# Patient Record
Sex: Female | Born: 1955 | Race: White | Hispanic: No | Marital: Married | Smoking: Former smoker
Health system: Southern US, Community
[De-identification: ages and names within clinical notes are randomized; demographics above are authoritative.]

## PROBLEM LIST (undated history)

## (undated) DIAGNOSIS — M5030 Other cervical disc degeneration, unspecified cervical region: Secondary | ICD-10-CM

## (undated) DIAGNOSIS — K635 Polyp of colon: Secondary | ICD-10-CM

## (undated) DIAGNOSIS — K219 Gastro-esophageal reflux disease without esophagitis: Secondary | ICD-10-CM

## (undated) DIAGNOSIS — I10 Essential (primary) hypertension: Secondary | ICD-10-CM

## (undated) DIAGNOSIS — R945 Abnormal results of liver function studies: Secondary | ICD-10-CM

## (undated) DIAGNOSIS — K589 Irritable bowel syndrome without diarrhea: Secondary | ICD-10-CM

## (undated) DIAGNOSIS — K859 Acute pancreatitis without necrosis or infection, unspecified: Secondary | ICD-10-CM

## (undated) DIAGNOSIS — C801 Malignant (primary) neoplasm, unspecified: Secondary | ICD-10-CM

## (undated) DIAGNOSIS — Z800 Family history of malignant neoplasm of digestive organs: Secondary | ICD-10-CM

## (undated) DIAGNOSIS — Z803 Family history of malignant neoplasm of breast: Secondary | ICD-10-CM

## (undated) DIAGNOSIS — H5702 Anisocoria: Secondary | ICD-10-CM

## (undated) DIAGNOSIS — K449 Diaphragmatic hernia without obstruction or gangrene: Secondary | ICD-10-CM

## (undated) DIAGNOSIS — K760 Fatty (change of) liver, not elsewhere classified: Secondary | ICD-10-CM

## (undated) DIAGNOSIS — J329 Chronic sinusitis, unspecified: Secondary | ICD-10-CM

## (undated) DIAGNOSIS — D649 Anemia, unspecified: Secondary | ICD-10-CM

## (undated) DIAGNOSIS — T7840XA Allergy, unspecified, initial encounter: Secondary | ICD-10-CM

## (undated) DIAGNOSIS — E039 Hypothyroidism, unspecified: Secondary | ICD-10-CM

## (undated) DIAGNOSIS — M62830 Muscle spasm of back: Secondary | ICD-10-CM

## (undated) DIAGNOSIS — K2970 Gastritis, unspecified, without bleeding: Secondary | ICD-10-CM

## (undated) DIAGNOSIS — Q828 Other specified congenital malformations of skin: Secondary | ICD-10-CM

## (undated) DIAGNOSIS — Z923 Personal history of irradiation: Secondary | ICD-10-CM

## (undated) DIAGNOSIS — M1990 Unspecified osteoarthritis, unspecified site: Secondary | ICD-10-CM

## (undated) HISTORY — PX: EYE SURGERY: SHX253

## (undated) HISTORY — PX: CHOLECYSTECTOMY: SHX55

## (undated) HISTORY — DX: Acute pancreatitis without necrosis or infection, unspecified: K85.90

## (undated) HISTORY — DX: Gastritis, unspecified, without bleeding: K29.70

## (undated) HISTORY — PX: PATELLA RECONSTRUCTION: SHX736

## (undated) HISTORY — PX: POLYPECTOMY: SHX149

## (undated) HISTORY — DX: Abnormal results of liver function studies: R94.5

## (undated) HISTORY — DX: Family history of malignant neoplasm of digestive organs: Z80.0

## (undated) HISTORY — DX: Chronic sinusitis, unspecified: J32.9

## (undated) HISTORY — DX: Essential (primary) hypertension: I10

## (undated) HISTORY — DX: Other cervical disc degeneration, unspecified cervical region: M50.30

## (undated) HISTORY — PX: UPPER GASTROINTESTINAL ENDOSCOPY: SHX188

## (undated) HISTORY — DX: Polyp of colon: K63.5

## (undated) HISTORY — DX: Unspecified osteoarthritis, unspecified site: M19.90

## (undated) HISTORY — PX: COLONOSCOPY: SHX174

## (undated) HISTORY — DX: Irritable bowel syndrome, unspecified: K58.9

## (undated) HISTORY — DX: Anisocoria: H57.02

## (undated) HISTORY — PX: BREAST LUMPECTOMY: SHX2

## (undated) HISTORY — DX: Other specified congenital malformations of skin: Q82.8

## (undated) HISTORY — DX: Gastro-esophageal reflux disease without esophagitis: K21.9

## (undated) HISTORY — DX: Malignant (primary) neoplasm, unspecified: C80.1

## (undated) HISTORY — DX: Hypothyroidism, unspecified: E03.9

## (undated) HISTORY — DX: Family history of malignant neoplasm of breast: Z80.3

## (undated) HISTORY — PX: ESOPHAGOGASTRODUODENOSCOPY: SHX1529

## (undated) HISTORY — PX: TUBAL LIGATION: SHX77

## (undated) HISTORY — DX: Muscle spasm of back: M62.830

## (undated) HISTORY — DX: Anemia, unspecified: D64.9

## (undated) HISTORY — DX: Allergy, unspecified, initial encounter: T78.40XA

## (undated) HISTORY — DX: Fatty (change of) liver, not elsewhere classified: K76.0

## (undated) HISTORY — PX: COLONOSCOPY W/ BIOPSIES: SHX1374

## (undated) HISTORY — DX: Diaphragmatic hernia without obstruction or gangrene: K44.9

## (undated) HISTORY — PX: ANAL FISTULECTOMY: SHX1139

---

## 1999-05-14 ENCOUNTER — Encounter: Payer: Self-pay | Admitting: Obstetrics and Gynecology

## 1999-05-14 ENCOUNTER — Encounter: Admission: RE | Admit: 1999-05-14 | Discharge: 1999-05-14 | Payer: Self-pay | Admitting: Obstetrics and Gynecology

## 2000-06-10 ENCOUNTER — Encounter: Payer: Self-pay | Admitting: Obstetrics and Gynecology

## 2000-06-10 ENCOUNTER — Encounter: Admission: RE | Admit: 2000-06-10 | Discharge: 2000-06-10 | Payer: Self-pay | Admitting: Obstetrics and Gynecology

## 2000-06-24 ENCOUNTER — Other Ambulatory Visit: Admission: RE | Admit: 2000-06-24 | Discharge: 2000-06-24 | Payer: Self-pay | Admitting: Obstetrics and Gynecology

## 2000-06-29 ENCOUNTER — Ambulatory Visit (HOSPITAL_COMMUNITY): Admission: RE | Admit: 2000-06-29 | Discharge: 2000-06-29 | Payer: Self-pay | Admitting: Obstetrics and Gynecology

## 2000-06-29 ENCOUNTER — Encounter: Payer: Self-pay | Admitting: Obstetrics and Gynecology

## 2001-01-07 ENCOUNTER — Encounter: Payer: Self-pay | Admitting: General Surgery

## 2001-01-11 ENCOUNTER — Encounter (INDEPENDENT_AMBULATORY_CARE_PROVIDER_SITE_OTHER): Payer: Self-pay | Admitting: Specialist

## 2001-01-11 ENCOUNTER — Observation Stay (HOSPITAL_COMMUNITY): Admission: RE | Admit: 2001-01-11 | Discharge: 2001-01-12 | Payer: Self-pay | Admitting: General Surgery

## 2001-07-27 ENCOUNTER — Encounter: Payer: Self-pay | Admitting: Obstetrics and Gynecology

## 2001-07-27 ENCOUNTER — Encounter: Admission: RE | Admit: 2001-07-27 | Discharge: 2001-07-27 | Payer: Self-pay | Admitting: Obstetrics and Gynecology

## 2002-02-17 ENCOUNTER — Encounter: Payer: Self-pay | Admitting: Gastroenterology

## 2002-02-23 ENCOUNTER — Other Ambulatory Visit: Admission: RE | Admit: 2002-02-23 | Discharge: 2002-02-23 | Payer: Self-pay | Admitting: Unknown Physician Specialty

## 2002-08-15 ENCOUNTER — Encounter (HOSPITAL_COMMUNITY): Admission: RE | Admit: 2002-08-15 | Discharge: 2002-09-14 | Payer: Self-pay | Admitting: Endocrinology

## 2002-09-01 ENCOUNTER — Encounter: Admission: RE | Admit: 2002-09-01 | Discharge: 2002-09-01 | Payer: Self-pay | Admitting: Obstetrics and Gynecology

## 2002-09-01 ENCOUNTER — Encounter: Payer: Self-pay | Admitting: Obstetrics and Gynecology

## 2004-12-02 ENCOUNTER — Ambulatory Visit (HOSPITAL_COMMUNITY): Admission: RE | Admit: 2004-12-02 | Discharge: 2004-12-02 | Payer: Self-pay | Admitting: Obstetrics and Gynecology

## 2004-12-31 ENCOUNTER — Encounter: Admission: RE | Admit: 2004-12-31 | Discharge: 2004-12-31 | Payer: Self-pay | Admitting: Obstetrics and Gynecology

## 2005-01-13 ENCOUNTER — Other Ambulatory Visit: Admission: RE | Admit: 2005-01-13 | Discharge: 2005-01-13 | Payer: Self-pay | Admitting: Obstetrics and Gynecology

## 2005-01-22 ENCOUNTER — Encounter: Admission: RE | Admit: 2005-01-22 | Discharge: 2005-01-22 | Payer: Self-pay | Admitting: Obstetrics and Gynecology

## 2005-12-15 ENCOUNTER — Ambulatory Visit: Payer: Self-pay | Admitting: Gastroenterology

## 2005-12-21 ENCOUNTER — Encounter (INDEPENDENT_AMBULATORY_CARE_PROVIDER_SITE_OTHER): Payer: Self-pay | Admitting: *Deleted

## 2005-12-21 ENCOUNTER — Ambulatory Visit: Payer: Self-pay | Admitting: Gastroenterology

## 2005-12-21 DIAGNOSIS — K2990 Gastroduodenitis, unspecified, without bleeding: Secondary | ICD-10-CM

## 2005-12-21 DIAGNOSIS — K2970 Gastritis, unspecified, without bleeding: Secondary | ICD-10-CM | POA: Insufficient documentation

## 2005-12-21 DIAGNOSIS — K449 Diaphragmatic hernia without obstruction or gangrene: Secondary | ICD-10-CM | POA: Insufficient documentation

## 2005-12-23 ENCOUNTER — Ambulatory Visit: Payer: Self-pay | Admitting: Internal Medicine

## 2006-01-05 ENCOUNTER — Ambulatory Visit: Payer: Self-pay | Admitting: Gastroenterology

## 2006-01-06 ENCOUNTER — Encounter: Admission: RE | Admit: 2006-01-06 | Discharge: 2006-01-06 | Payer: Self-pay | Admitting: Obstetrics and Gynecology

## 2006-03-30 ENCOUNTER — Ambulatory Visit: Payer: Self-pay | Admitting: Gastroenterology

## 2006-03-30 LAB — CONVERTED CEMR LAB
ALT: 51 units/L — ABNORMAL HIGH (ref 0–40)
AST: 41 units/L — ABNORMAL HIGH (ref 0–37)
Albumin: 3.6 g/dL (ref 3.5–5.2)
Alkaline Phosphatase: 105 units/L (ref 39–117)
Total Bilirubin: 0.5 mg/dL (ref 0.3–1.2)

## 2006-03-31 ENCOUNTER — Encounter: Admission: RE | Admit: 2006-03-31 | Discharge: 2006-06-29 | Payer: Self-pay | Admitting: Gastroenterology

## 2006-07-08 ENCOUNTER — Ambulatory Visit: Payer: Self-pay | Admitting: Gastroenterology

## 2006-07-08 LAB — CONVERTED CEMR LAB
AST: 30 units/L (ref 0–37)
Albumin: 3.5 g/dL (ref 3.5–5.2)
Bilirubin, Direct: 0.1 mg/dL (ref 0.0–0.3)
Total Bilirubin: 0.7 mg/dL (ref 0.3–1.2)
Total Protein: 6.6 g/dL (ref 6.0–8.3)

## 2006-11-23 ENCOUNTER — Ambulatory Visit: Payer: Self-pay | Admitting: Gastroenterology

## 2006-11-23 LAB — CONVERTED CEMR LAB
Albumin: 3.8 g/dL (ref 3.5–5.2)
Alkaline Phosphatase: 102 units/L (ref 39–117)
Hgb A1c MFr Bld: 5.6 % (ref 4.6–6.0)
Total Bilirubin: 0.6 mg/dL (ref 0.3–1.2)

## 2007-01-26 ENCOUNTER — Ambulatory Visit: Payer: Self-pay | Admitting: Gastroenterology

## 2007-01-26 LAB — CONVERTED CEMR LAB
AST: 37 units/L (ref 0–37)
Albumin: 3.6 g/dL (ref 3.5–5.2)
Alkaline Phosphatase: 94 units/L (ref 39–117)
Total Bilirubin: 0.5 mg/dL (ref 0.3–1.2)

## 2007-03-23 DIAGNOSIS — Z8639 Personal history of other endocrine, nutritional and metabolic disease: Secondary | ICD-10-CM | POA: Insufficient documentation

## 2007-03-23 DIAGNOSIS — Z862 Personal history of diseases of the blood and blood-forming organs and certain disorders involving the immune mechanism: Secondary | ICD-10-CM

## 2007-03-23 DIAGNOSIS — J309 Allergic rhinitis, unspecified: Secondary | ICD-10-CM | POA: Insufficient documentation

## 2007-03-23 DIAGNOSIS — K589 Irritable bowel syndrome without diarrhea: Secondary | ICD-10-CM | POA: Insufficient documentation

## 2007-03-23 DIAGNOSIS — M1711 Unilateral primary osteoarthritis, right knee: Secondary | ICD-10-CM | POA: Insufficient documentation

## 2007-03-23 DIAGNOSIS — I10 Essential (primary) hypertension: Secondary | ICD-10-CM | POA: Insufficient documentation

## 2007-03-23 DIAGNOSIS — Z8719 Personal history of other diseases of the digestive system: Secondary | ICD-10-CM | POA: Insufficient documentation

## 2007-05-17 ENCOUNTER — Encounter: Admission: RE | Admit: 2007-05-17 | Discharge: 2007-05-17 | Payer: Self-pay | Admitting: Obstetrics and Gynecology

## 2007-06-16 ENCOUNTER — Ambulatory Visit: Payer: Self-pay | Admitting: Gastroenterology

## 2007-06-21 LAB — CONVERTED CEMR LAB
ALT: 42 units/L — ABNORMAL HIGH (ref 0–35)
AST: 32 units/L (ref 0–37)
Total Bilirubin: 0.7 mg/dL (ref 0.3–1.2)
Total Protein: 7.2 g/dL (ref 6.0–8.3)

## 2007-06-22 ENCOUNTER — Telehealth: Payer: Self-pay | Admitting: Gastroenterology

## 2007-07-12 ENCOUNTER — Ambulatory Visit: Payer: Self-pay | Admitting: Gastroenterology

## 2007-07-12 DIAGNOSIS — R1011 Right upper quadrant pain: Secondary | ICD-10-CM | POA: Insufficient documentation

## 2007-09-27 ENCOUNTER — Telehealth: Payer: Self-pay | Admitting: Gastroenterology

## 2007-10-12 ENCOUNTER — Ambulatory Visit: Payer: Self-pay | Admitting: Gastroenterology

## 2007-10-12 LAB — CONVERTED CEMR LAB
ALT: 29 units/L (ref 0–35)
Hgb A1c MFr Bld: 5.7 % (ref 4.6–6.0)
Total Bilirubin: 0.7 mg/dL (ref 0.3–1.2)
Total Protein: 7.2 g/dL (ref 6.0–8.3)

## 2007-11-17 ENCOUNTER — Telehealth: Payer: Self-pay | Admitting: Gastroenterology

## 2007-11-18 ENCOUNTER — Ambulatory Visit: Payer: Self-pay | Admitting: Gastroenterology

## 2007-11-21 ENCOUNTER — Ambulatory Visit: Payer: Self-pay | Admitting: Gastroenterology

## 2007-11-21 LAB — CONVERTED CEMR LAB
AST: 23 units/L (ref 0–37)
Bilirubin, Direct: 0.1 mg/dL (ref 0.0–0.3)
Total Bilirubin: 0.7 mg/dL (ref 0.3–1.2)

## 2007-11-29 ENCOUNTER — Ambulatory Visit (HOSPITAL_COMMUNITY): Admission: RE | Admit: 2007-11-29 | Discharge: 2007-11-29 | Payer: Self-pay | Admitting: Gastroenterology

## 2008-01-09 ENCOUNTER — Encounter: Payer: Self-pay | Admitting: Gastroenterology

## 2008-05-24 ENCOUNTER — Encounter: Admission: RE | Admit: 2008-05-24 | Discharge: 2008-05-24 | Payer: Self-pay | Admitting: Obstetrics and Gynecology

## 2009-01-28 ENCOUNTER — Encounter: Payer: Self-pay | Admitting: Gastroenterology

## 2009-01-28 ENCOUNTER — Telehealth: Payer: Self-pay | Admitting: Gastroenterology

## 2009-05-02 ENCOUNTER — Ambulatory Visit: Payer: Self-pay | Admitting: Gastroenterology

## 2009-05-02 DIAGNOSIS — Z8719 Personal history of other diseases of the digestive system: Secondary | ICD-10-CM | POA: Insufficient documentation

## 2009-05-09 ENCOUNTER — Ambulatory Visit (HOSPITAL_COMMUNITY): Admission: RE | Admit: 2009-05-09 | Discharge: 2009-05-09 | Payer: Self-pay | Admitting: Gastroenterology

## 2009-05-27 ENCOUNTER — Other Ambulatory Visit: Admission: RE | Admit: 2009-05-27 | Discharge: 2009-05-27 | Payer: Self-pay | Admitting: Obstetrics and Gynecology

## 2009-06-17 ENCOUNTER — Encounter: Admission: RE | Admit: 2009-06-17 | Discharge: 2009-06-17 | Payer: Self-pay | Admitting: Obstetrics and Gynecology

## 2010-01-22 ENCOUNTER — Encounter
Admission: RE | Admit: 2010-01-22 | Discharge: 2010-01-22 | Payer: Self-pay | Source: Home / Self Care | Attending: Family Medicine | Admitting: Family Medicine

## 2010-02-06 ENCOUNTER — Encounter: Payer: Self-pay | Admitting: Gastroenterology

## 2010-02-09 ENCOUNTER — Encounter: Payer: Self-pay | Admitting: Gastroenterology

## 2010-02-11 ENCOUNTER — Other Ambulatory Visit
Admission: RE | Admit: 2010-02-11 | Discharge: 2010-02-11 | Payer: Self-pay | Source: Home / Self Care | Admitting: Interventional Radiology

## 2010-02-11 ENCOUNTER — Encounter
Admission: RE | Admit: 2010-02-11 | Discharge: 2010-02-11 | Payer: Self-pay | Source: Home / Self Care | Attending: Family Medicine | Admitting: Family Medicine

## 2010-02-11 ENCOUNTER — Other Ambulatory Visit: Payer: Self-pay | Admitting: Interventional Radiology

## 2010-02-15 ENCOUNTER — Encounter: Payer: Self-pay | Admitting: Obstetrics and Gynecology

## 2010-02-27 NOTE — Miscellaneous (Signed)
Summary: Change in PPI  patient must try and fail two covered PPIs before they will cover Nexium. She has tried omeprazole but no others she has agreed to try a different ppi covered by her insurance. I sent protonix  Clinical Lists Changes  Medications: Changed medication from NEXIUM 40 MG  CPDR (ESOMEPRAZOLE MAGNESIUM) once daily to PANTOPRAZOLE SODIUM 40 MG TBEC (PANTOPRAZOLE SODIUM) take one by mouth once daily - Signed Rx of PANTOPRAZOLE SODIUM 40 MG TBEC (PANTOPRAZOLE SODIUM) take one by mouth once daily;  #90 x 1;  Signed;  Entered by: Harlow Mares CMA (AAMA);  Authorized by: Mardella Layman MD [facility];  Method used: Electronically to [facility]*, , ,   , Ph: [phone], Fax: [phone]    Prescriptions: PANTOPRAZOLE SODIUM 40 MG TBEC (PANTOPRAZOLE SODIUM) take one by mouth once daily  #90 x 1   Entered by:   Harlow Mares CMA (AAMA)   Authorized by:   Mardella Layman MD [facility]   Signed by:   Harlow Mares CMA (AAMA) on 02/06/2010   Method used:   Electronically to        [facility] (retail)             ,          Ph: [phone]       Fax: [phone]   RxID:   [id number]

## 2010-02-27 NOTE — Medication Information (Signed)
Summary: Prior autho request for Nexium/[facility]  Prior autho request for Nexium/[facility]   Imported By: Sherian Rein 02/13/2010 14:20:19  _____________________________________________________________________  External Attachment:    Type:   Image     Comment:   External Document

## 2010-02-27 NOTE — Assessment & Plan Note (Signed)
Summary: meds refill--ch.    History of Present Illness Visit Type: Follow-up Visit Primary GI MD: Sheryn Bison MD FACP FAGA Primary Provider: Benny Lennert, PA Chief Complaint: Needs refills on Nexium and Potassium sent to [facility], Pt only c/o RUQ pain that radiates to back. Pt states she has been dx'd with fatty liver in the past History of Present Illness:   Catherine Navarro is asymptomatic except for musculoskeletal right upper quadrant pain. She denies reflux symptoms taking daily Nexium. Her weight is under control and she is exercising regularly. Liver function tests have been normal. She has known fatty liver with last ultrasound approximately a year and a half ago. She is up-to-date on colonoscopy and denies better regularity, melena or hematochezia. She is status post cholecystectomy.   GI Review of Systems    Reports abdominal pain.     Location of  Abdominal pain: RUQ.    Denies acid reflux, belching, bloating, chest pain, dysphagia with liquids, dysphagia with solids, heartburn, loss of appetite, nausea, vomiting, vomiting blood, weight loss, and  weight gain.      Reports constipation, diarrhea, and  liver problems.     Denies anal fissure, black tarry stools, change in bowel habit, diverticulosis, fecal incontinence, heme positive stool, hemorrhoids, irritable bowel syndrome, jaundice, light color stool, rectal bleeding, and  rectal pain.    Current Medications (verified): 1)  Nexium 40 Mg  Cpdr (Esomeprazole Magnesium) .... Once Daily 2)  Hyzaar 100-12.5 Mg  Tabs (Losartan Potassium-Hctz) .... Once Daily 3)  Innopran Xl 80 Mg  Cp24 (Propranolol Hcl Sr Beads) .... Once Daily 4)  Potassium Chloride Crys Cr 20 Meq  Tbcr (Potassium Chloride Crys Cr) .... Once Daily 5)  Multivitamins   Tabs (Multiple Vitamin) .... Once Daily 6)  Vitamin B-12 Cr 2000 Mcg Cr-Tabs (Cyanocobalamin) 7)  Garlic 400 Mg  Tbec (Garlic) .... Once Daily 8)  Claritin 10 Mg  Caps (Loratadine) .... Once Daily 9)   Aspirin 81 Mg Tbec (Aspirin) .Marland Kitchen.. 1 By Mouth Every Other Day 10)  Vitamin D 2000 Unit Caps (Cholecalciferol) .Marland Kitchen.. 1 By Mouth Once Daily  Allergies (verified): 1)  ! 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  Alcohol Use - no Daily Caffeine Use Illicit Drug Use - no Patient gets regular exercise.  Past medical, surgical, family and social histories (including risk factors) reviewed for relevance to current acute and chronic problems.  Past Medical History: Reviewed history from 03/23/2007 and no changes required. HIATAL HERNIA (ICD-553.3) GASTRITIS (ICD-535.50) PANCREATITIS, ACUTE, HX OF (ICD-V12.70) LIVER FUNCTION TESTS, ABNORMAL, HX OF (ICD-V12.2) SINUSITIS (ICD-473.9) IBS (ICD-564.1) GERD (ICD-530.81) THYROID DISORDER (ICD-246.9) OSTEOARTHRITIS (ICD-715.90) HYPERTENSION (ICD-401.9)  Past Surgical History: Reviewed history from 03/23/2007 and no changes required. Tubaligation (1980's) Rt Knee cap (1977) Rt Eye Cholecystectomy (2002) Anal Fistulotomy (2002)  Family History: Reviewed history from 07/12/2007 and no changes required. Family History of Breast Cancer:Aunt No FH of Colon Cancer: Family History of Colon Polyps:Brother Family History of Diabetes:Mother, sister  Family History of Heart Disease: Grandfather Family History of Irritable Bowel Syndrome:Mother  Social History: Reviewed history from 07/12/2007 and no changes required. 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  Alcohol Use - no Daily Caffeine Use Illicit  Drug Use - no Patient gets regular exercise.  Review of Systems        The patient complains of arthritis/joint pain.  The patient denies allergy/sinus, anemia, anxiety-new, back pain, blood in urine, breast changes/lumps, change in vision, confusion, cough, coughing up blood, depression-new, fainting, fatigue, fever, headaches-new, hearing problems, heart murmur, heart rhythm changes, itching, menstrual pain, muscle pains/cramps, night sweats, nosebleeds, pregnancy symptoms, shortness of breath, skin rash, sleeping problems, sore throat, swelling of feet/legs, swollen lymph glands, thirst - excessive , urination - excessive , urination changes/pain, urine leakage, vision changes, and voice change.    Vital Signs:  Patient profile:   55 year old female Height:      66 inches Weight:      255 pounds BMI:     41.31 BSA:     2.22 Pulse rate:   74 / minute Pulse rhythm:   regular BP sitting:   132 / 82  (left arm)  Vitals Entered By: Merri Ray CMA Duncan Dull) (May 02, 2009 11:34 AM)  Physical Exam  General:  Well developed, well nourished, no acute distress.obese.   Head:  Normocephalic and atraumatic. Eyes:  PERRLA, no icterus.exam deferred to patient's ophthalmologist.   Abdomen:  Soft, nontender and nondistended. No masses, hepatosplenomegaly or hernias noted. Normal bowel sounds.obese.  Markedly tender over floating rib in the right lateral costal area. She does not appear to have significant hepatomegaly or hepatic tenderness at this time. Extremities:  No clubbing, cyanosis, edema or deformities noted. Neurologic:  Alert and  oriented x4;  grossly normal neurologically. Psych:  Alert and cooperative. Normal mood and affect.   Impression & Recommendations:  Problem # 1:  FATTY LIVER DISEASE (ICD-571.8) Assessment Improved Followup ultrasound of upper abdomen ordered. She should continue to have every six-month liver function tests exams. Again we have discussed weight loss and exercise programs.  Problem # 2:  ABDOMINAL PAIN-RUQ  (ICD-789.01) Assessment: Unchanged Obviously muscle skeletal pain at this time related to her obesity, work, and exercise routine. I prescribed Celebrex 200 mg one to 2 times a day as tolerated and needed.  Problem # 3:  GERD (ICD-530.81) Assessment: Improved Continue reflux regime and daily PPI therapy.  Patient Instructions: 1)  You are being scheduled for an ultrasound. 2)  Begin Celebrex once or twice a day an needed. 3)  Please continue current medications.  4)  The medication list was reviewed and reconciled.  All changed / newly prescribed medications were explained.  A complete medication list was provided to the patient / caregiver. 5)  Copy sent to : Benny Lennert, PA. 6)  Please continue current medications.  7)  Avoid foods high in acid content ( tomatoes, citrus juices, spicy foods) . Avoid eating within 3 to 4 hours of lying down or before exercising. Do not over eat; try smaller more frequent meals. Elevate head of bed four inches when sleeping.   Appended Document: meds refill--ch.    Clinical Lists Changes  Medications: Added new medication of CELEBREX 200 MG CAPS (CELECOXIB) 1 by mouth once or twice a day as needed. - Signed Changed medication from NEXIUM 40 MG  CPDR (ESOMEPRAZOLE MAGNESIUM) once daily to NEXIUM 40 MG  CPDR (ESOMEPRAZOLE MAGNESIUM) once daily - Signed Rx of CELEBREX 200 MG CAPS (CELECOXIB) 1 by mouth once or twice a day as needed.;  #45 x 3;  Signed;  Entered by: Ashok Cordia RN;  Authorized by: Mardella Layman MD [facility];  Method used: Electronically  to [facility] [address] [place], [place], [place]  [address], Ph: [phone], Fax: [phone] Rx of NEXIUM 40 MG  CPDR (ESOMEPRAZOLE MAGNESIUM) once daily;  #90 x 3;  Signed;  Entered by: Ashok Cordia RN;  Authorized by: Mardella Layman MD [facility]  Method used: Electronically to [facility]*, , ,   , Ph: [phone], Fax: [phone] Orders: Added new Test order of Ultrasound  Abdomen (UAS) - Signed    Prescriptions: NEXIUM 40 MG  CPDR (ESOMEPRAZOLE MAGNESIUM) once daily  #90 x 3   Entered by:   Ashok Cordia RN   Authorized by:   Mardella Layman MD [facility]   Signed by:   Ashok Cordia RN on 05/02/2009   Method used:   Electronically to        [facility]* (mail-order)             ,          Ph: [phone]       Fax: [phone]   RxID:   [id number] CELEBREX 200 MG CAPS (CELECOXIB) 1 by mouth once or twice a day as needed.  #45 x 3   Entered by:   Ashok Cordia RN   Authorized by:   Mardella Layman MD [facility]   Signed by:   Ashok Cordia RN on 05/02/2009   Method used:   Electronically to        [facility] (retail)       [address]       [place], [place]  [address]       Ph: [phone]       Fax: [phone]   RxID:   [id number]

## 2010-02-27 NOTE — Progress Notes (Signed)
Summary: Returning you call   Phone Note Outgoing Call   Summary of Call: Nexium needs have proir authorization.  Left message for pt to call,  has pt ever tried any other meds for reflux.  None found in EMR.  paper chart ordered.  (see forms) Initial call taken by: Ashok Cordia RN,  January 28, 2009 11:44 AM  Follow-up for Phone Call        Reviewed old chart.  Pt has been on omepraxole and prevacid in the past.  Form filled out and [facility]ed [facility]  January 28, 2009 3:38 PM  Additional Follow-up for Phone Call Additional follow up Details #1::        Returning your call. Would like to be called back at [phone] Additional Follow-up by: Leanor Kail [facility],  January 29, 2009 9:50 AM    Additional Follow-up for Phone Call Additional follow up Details #2::    Talked with pt.  Explained that we are trying to get  Nexium approved.  Pt verified that she has tried Prlosec, Omeprazole and Prevacid in the past. form faxed on 01/28/09.  waiting for reply. Follow-up by: Ashok Cordia RN,  January 29, 2009 12:10 PM

## 2010-02-27 NOTE — Medication Information (Signed)
Summary: Approved-Nexium/[employer]  Approved-Nexium/[employer]   Imported By: Sherian Rein 02/01/2009 13:30:26  _____________________________________________________________________  External Attachment:    Type:   Image     Comment:   External Document

## 2010-06-10 NOTE — Assessment & Plan Note (Signed)
[facility]                         GASTROENTEROLOGY OFFICE NOTE   Catherine, Navarro                     MRN:          [id number]  DATE:11/23/2006                            DOB:          1955-07-06    Catherine Navarro is a 55 year old white female I have followed for several years  now with suspected fatty infiltration of her liver.  This was confirmed  by ultrasonography and otherwise negative hepatic workup but she has not  had liver biopsy.  She is status post cholecystectomy.  Attempts to  treat her with dietary therapy have been unsuccessful.  She does not use  alcohol or any over-the-counter medications.  Liver profile on July 08, 2006, was entirely normal. She returns for followup exam and evaluation  of periodic right upper quadrant pain.   She is on a variety of medications reviewed in her chart which include  daily Nexium for acid reflux.  She has rather typical burning substernal  chest pain with regurgitation.  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  She has chronic thyroid dysfunction and  hypertension along with degenerative arthritis that is managed by Dr.  Robert Bellow.   She is a healthy-appearing white female without stigmata of chronic  liver disease.  She weighs 274 pounds which is the same weight she had a  year ago.  Blood pressure is 126/80, the pulse was 70 and regular.  She  did have an enlarged liver in the right upper quadrant with a slightly  firm and tender edge but no splenomegaly, other abdominal masses or  tenderness.   ASSESSMENT:  1. Fatty infiltration of the liver with vague right upper quadrant      pain.  2. Status post cholecystectomy.  3. Chronic acid reflux disease, doing well on Nexium therapy.  4. History of irritable bowel syndrome with p.r.n. Levsin use.  5. Well-controlled essential hypertension.  6. Continued obesity without any history that I am aware of      significant  hyperlipidemia or diabetes but diabetes does run in her      family.   RECOMMENDATIONS:  1. Repeat liver profile and hemoglobin A1c.  2. Continue IBS regime and p.r.n. Levsin.  3. Continue reflux regime with maintenance Nexium therapy.  4. If liver function tests are normal at this we will space these out      to every 6 months.  Depending on her clinical course and enzyme      levels, we may need to give consideration to liver biopsy, although      I think this would carry some risk because of her obesity.  Liver      workup otherwise including viral causes and metabolic causes of      liver disease have been negative.  She also has no evidence of      autoimmune liver disease.     Vania Rea. Jarold Motto, MD, [name], FAGA  Electronically Signed    DRP/Med[facility]  DD: 11/23/2006  DT: 11/23/2006  Job #: [id number]   cc:   Catherine Navarro, M.D.

## 2010-06-13 NOTE — Assessment & Plan Note (Signed)
[facility]                         GASTROENTEROLOGY OFFICE NOTE   KENLEY, RETTINGER                     MRN:          [id number]  DATE:03/30/2006                            DOB:          [birth year]    Ronette is no longer having acid reflux symptoms or dysphagia after  dilation and she is on Nexium 40 mg a day.  Her right upper quadrant  pain also is dramatically improved.  She continued with fatty  infiltration of her liver, has been unsuccessful in weight loss having  gained 4 pounds since her last visit.  We had a long talk today about  weight loss, exercise, the need to follow her fatty liver closely  because of her abnormal enzymes.  I have referred her back to [facility], will repeat her liver profile today, and see her  back in 3 month's time and repeat her liver profile before clinic visit.     Vania Rea. Jarold Motto, MD, [name], FAGA  Electronically Signed    DRP/MedQ  DD: 03/30/2006  DT: 03/30/2006  Job #: [id number]

## 2010-06-13 NOTE — Assessment & Plan Note (Signed)
HEALTHCARE                           GASTROENTEROLOGY OFFICE NOTE   NELLI, SWALLEY                     MRN:          [id number]  DATE:12/15/2005                            DOB:          1955-07-07    NEW PATIENT CONSULTATION:  [name] is a 55 year old white female, an  employee at [employer], referred through the courtesy of Dr. Ashley Jacobs.  Guest for an evaluation of dysphagia and refractory acid reflux symptoms.   Elexa was previously a patient of mine and has been a patient of mine for  many years.  She was last seen three years ago.  She is status post  cholecystectomy for cholelithiasis.  She has chronic acid reflux and has  been on PPI therapy for several years.  She currently is doing well but has  some intermittent dysphagia in the retropharyngeal area.  Her last  colonoscopy was in January 2004.  She has fatty infiltration of her liver  with the recurrent right upper quadrant pain, but has had normal liver  function tests, last checked three years ago.  Because of her right upper  quadrant pain, she had an ERCP in March 1995, which was normal because of  post-procedure pancreatitis.  This was performed by Dr. Barbette Hair. Arlyce Dice.  She also suffered from essential hypertension and obesity.   Her only complaint today is intermittent right upper quadrant pain and  intermittent dysphagia.  Her bowels are moving well and she denies melena or  hematochezia.  She has had no weight loss, and in fact is gaining weight.  She has not had blood work done within the last year, she relates.  She does  have a frequent cough and wheezing, which is perhaps related to her acid  reflux.   PAST MEDICAL HISTORY:  Otherwise is also remarkable for hypertension for the  last 10 years, chronic thyroid dysfunction and degenerative arthritis, along  with allergic sinusitis.  In addition to her cholecystectomy five years ago,  she has had a tubal  ligation some 30 years ago.   FAMILY HISTORY:  Remarkable for colon polyps in her brother.   SOCIAL HISTORY:  She is divorced and lives by herself.  She is a Recruitment consultant.  She does not smoke or abuse ethanol.   REVIEW OF SYSTEMS:  Noncontributory, without cardiovascular symptoms, except  for dyspnea on exertion, probably related to deconditioning.  She has no  symptoms of chest pain on exertion, palpitations, cough, sputum production,  etc.  As mentioned above, she does have chronic thyroid dysfunction.   MEDICATIONS:  1. Glucosamine b.i.d.  2. Ibuprofen 800 mg t.i.d.  3. Nexium 40 mg q.d.  4. Zoloft 50 mg q.d. for depression.  5. Hyzaar 10-25 mg q.d.  6. Inderal 80 mg q.d.  7. Aspirin 81 mg q.d.  8. __________ 110 mg q.d.   ALLERGIES/INTOLERANCES:  She in the past has had headaches with CODEINE use.   PHYSICAL EXAMINATION:  GENERAL:  Shows her to be a healthy-appearing white  female, who appears her stated age, in no acute distress.  VITAL SIGNS:  She is 5 feet, 6 inches tall and weights 280 pounds.  Blood  pressure 144/80, pulse 68 and regular.  SKIN:  I could not appreciate stigmata of chronic liver disease.  CHEST:  Clear to percussion and auscultation.  HEART:  I could not appreciate murmurs, gallops or rubs.  She appeared to be  in a regular rhythm.  ABDOMEN:  Rather marked obesity, without definite organomegaly.  She is very  tender over her lateral rib margins in the right upper quadrant area.  Bowel  sounds were normal.  EXTREMITIES:  Her extremities were unremarkable.  RECTAL:  Deferred.  NEUROLOGIC:  Mental status:  Clear.   ASSESSMENT:  1. Chronic acid reflux with possible exacerbation, secondary to frequent      non-steroidal anti-inflammatory drug use.  She is on regular proton      pump inhibitor therapy.  Of course need to exclude a peptic stricture      of her esophagus.  2. Fatty infiltration of the liver with previous normal liver function       tests.  3. Right upper quadrant pain, probably musculoskeletal in etiology.  4. Well-controlled essential hypertension.  5. A family history of colon polyps in her brother.  6. History of chronic thyroid dysfunction.  7. History of degenerative arthritis.  8. Status post cholecystectomy.   RECOMMENDATIONS:  1. Repeat liver function tests and thyroid function tests.  2. Outpatient endoscopy and ultrasound exam.  3. Continue reflux regimen and daily PPI therapy.  4. Other medications as per Dr. Perrin Maltese.     Vania Rea. Jarold Motto, MD, [name], FAGA  Electronically Signed    DRP/MedQ  DD: 12/15/2005  DT: 12/15/2005  Job #: [id number]   cc:   Jonita Albee, M.D.

## 2010-06-13 NOTE — Op Note (Signed)
[facility]  Patient:    Catherine Navarro, Catherine Navarro Visit Number: [id number] MRN: [id number]          Service Type: SUR Location: [id number] Attending Physician:  Delsa Bern Dictated by:   Lorne Skeens. Hoxworth, M.D. Proc. Date: 01/11/01 Admit Date:  01/11/2001                             Operative Report  PREOPERATIVE DIAGNOSES:  1. Cholelithiasis.  2. Anal fistula.  POSTOPERATIVE DIAGNOSES:  1. Cholelithiasis.  2. Anal fistula.  PROCEDURE:  1. Laparoscopic cholecystectomy with intraoperative cholangiogram.  2. Anal fistulotomy.  SURGEON:  Lorne Skeens. Hoxworth, M.D.  ASSISTANT:  Zigmund Daniel, M.D.  BRIEF HISTORY:  Catherine Navarro is a 55 year old white female with a several year history of recurrent episodes of right upper quadrant abdominal pain and nausea. She has had negative workup including gallbladder ultrasound in the past. These episodes recently have become more severe and after a particularly severe episode requiring hospitalization, she was followedup by Dr. Jarold Motto and gallbladder ultrasound revealed cholelithiasis. In addition, she has had a spontaneous area of pain and drainage at her anus and on exam appears to have an external fistula opening at the anal verge. Laparoscopic cholecystectomy with cholangiogram as well as anal fistulotomy have been recommended and accepted. The nature of the procedure, its indications, risks of bleeding, infection, bile leak, bile duct injury, and possible need for open procedure were discussed and understood. She is now brought to the operating room for these procedures.  DESCRIPTION OF PROCEDURE:  The patient was brought to the operating room, placed in supine position on the operating table and general endotracheal anesthesia was induced. The abdomen was sterilely prepped and draped. Preoperative antibiotics were given. PAS were in place. Local anesthesia was used to infiltrate  the trocar sites prior to the incisions. A 1 cm incision was made at the umbilicus and dissection carried down the midline fascia. This was sharply incised for 1 cm and the peritoneum entered under direct vision. Through a mattress suture of #0 Vicryl, the Hasson trocar was placed and pneumoperitoneum established. Under direct vision, a 10 mm trocar was placed in the subxiphoid area and two 5 mm trocars along the right subcostal margin. The gallbladder did not appear acutely inflamed. There were however some significant omental adhesions and adhesions to the duodenum that were carefully taken down with blunt and sharp dissection completely exposing the infundibulum and Calots triangle. Fibrofatty tissue was then stripped off the neck of the gallbladder toward the porta hepatis. The peritoneum anterior and posterior was incised along the infundibulum and Calots triangle thoroughly dissected. The cystic duct was identified and the cystic duct gallbladder junction dissected 360 degrees. The cystic artery was identified and Calots triangle coursing up along the gallbladder wall. The cystic duct was clipped at the gallbladder junction and the cystic artery was clipped and then operative cholangiograms obtained through the cystic duct. These were normal with good filling of a normal size common bile duct and intrahepatic ducts with free flow into the duodenum with no filling defects. Following this, the cholangiocath was removed and the cystic duct was doubly clipped proximally and divided and the cystic artery doubly clipped distally and divided. The gallbladder was then dissected free from its bed using hook and cautery and removed intact through the umbilicus. The operative site was inspected for hemostasis which was complete. The trocar was removed  under direct vision, all CO2 evacuated from the peritoneal cavity. The mattress suture was secured at the umbilicus. The skin incisions were closed  with interrupted subcuticular 4-0 monocryl and Steri-Strips. Following this, the patient was carefully positioned in lithotomy position and the perineum sterilely prepped and draped. The anus was gently dilated and the central retractor replaced. There was a pin point area of slight purulent drainage just at the anal verge in the left anterior position. With the probe, this tracked just about a centimeter under very superficial tissue to an internal opening. This bridge was completely excised over the probe, while opening the area and further probing did not indicate any further areas of fistulization. The tissue was cauterized and operative area injected with Marcaine. A dry gauze dressing was applied. Sponge, needle and instrument counts were correct. The patient was taken to recovery in good condition having tolerated the procedure well. Dictated by:   Lorne Skeens. Hoxworth, M.D. Attending Physician:  Delsa Bern DD:  01/11/01 TD:  01/12/01 Job: [id number]

## 2010-06-13 NOTE — Assessment & Plan Note (Signed)
[facility]                         GASTROENTEROLOGY OFFICE NOTE   HELVI, ROYALS                     MRN:          [id number]  DATE:01/05/2006                            DOB:          23-Jan-1956    Takeyla denies dysphagia after endoscopy and dilatation on December 21, 2005.  Biopsy for H. pylori infection was negative.   She really is without any GI complaints at this time on Nexium.  Her  liver function tests were mildly abnormal with mild hyper-  transaminasemia, and ultrasound showed a fatty liver with hepatomegaly  and enlarged spleen.   EXAM:  Weight 276 pounds, blood pressure 160/80.  Pulse was 80 and  regular.  I could not appreciate stigmata of chronic liver disease.  There was no  definite hepatosplenomegaly, although her liver edge, with deep  inspiration, was palpable in the right upper quadrant and was somewhat  firm and tender.  Abdominal exam otherwise was negative.  Mental status was clear.   ASSESSMENT:  1. Well-controlled acid reflux with recurrent peptic strictures      requiring long-term maintenance proton pump inhibitor therapy.  2. 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  This patient has gained at least 50 pounds in the      last 3 years.  3. History of hypertension and chronic thyroid dysfunction, and      degenerative arthritis.  4. Family history of colon polyps in her brother with her last      colonoscopy in 2004.   RECOMMENDATIONS:  1. Continue reflux regimen along with reflux movie, and Nexium 40 mg a      day.  2. Hepatic screening test for metabolic liver disease and chronic      hepatitis.  3. Weight loss and exercise program suggested to the patient.  4. Followup in 3 months' time and repeat liver function tests and      consider further therapy for her obesity and fatty liver.  I      broached the subject today with her about liver biopsy and we will   see if this is needed in 3 month followup.     Vania Rea. Jarold Motto, MD, [name], FAGA  Electronically Signed    DRP/MedQ  DD: 01/05/2006  DT: 01/05/2006  Job #: [id number]   cc:   Jonita Albee, M.D.

## 2010-08-04 ENCOUNTER — Other Ambulatory Visit: Payer: Self-pay | Admitting: Gastroenterology

## 2010-11-10 ENCOUNTER — Other Ambulatory Visit: Payer: Self-pay | Admitting: Gastroenterology

## 2010-11-13 ENCOUNTER — Other Ambulatory Visit: Payer: Self-pay | Admitting: Obstetrics and Gynecology

## 2010-11-13 ENCOUNTER — Telehealth: Payer: Self-pay | Admitting: Gastroenterology

## 2010-11-13 DIAGNOSIS — Z1231 Encounter for screening mammogram for malignant neoplasm of breast: Secondary | ICD-10-CM

## 2010-11-13 NOTE — Telephone Encounter (Signed)
Left message with husband that patient needs office visit for any refills.

## 2010-11-27 ENCOUNTER — Other Ambulatory Visit (INDEPENDENT_AMBULATORY_CARE_PROVIDER_SITE_OTHER): Payer: 59

## 2010-11-27 ENCOUNTER — Ambulatory Visit (INDEPENDENT_AMBULATORY_CARE_PROVIDER_SITE_OTHER): Payer: 59 | Admitting: Gastroenterology

## 2010-11-27 ENCOUNTER — Encounter: Payer: Self-pay | Admitting: Gastroenterology

## 2010-11-27 DIAGNOSIS — R7989 Other specified abnormal findings of blood chemistry: Secondary | ICD-10-CM

## 2010-11-27 DIAGNOSIS — R0789 Other chest pain: Secondary | ICD-10-CM

## 2010-11-27 DIAGNOSIS — K7689 Other specified diseases of liver: Secondary | ICD-10-CM

## 2010-11-27 DIAGNOSIS — R109 Unspecified abdominal pain: Secondary | ICD-10-CM

## 2010-11-27 DIAGNOSIS — K219 Gastro-esophageal reflux disease without esophagitis: Secondary | ICD-10-CM

## 2010-11-27 DIAGNOSIS — K760 Fatty (change of) liver, not elsewhere classified: Secondary | ICD-10-CM

## 2010-11-27 DIAGNOSIS — R071 Chest pain on breathing: Secondary | ICD-10-CM

## 2010-11-27 HISTORY — DX: Other specified abnormal findings of blood chemistry: R79.89

## 2010-11-27 LAB — CBC WITH DIFFERENTIAL/PLATELET
Basophils Relative: 1.9 % (ref 0.0–3.0)
Eosinophils Absolute: 0.1 10*3/uL (ref 0.0–0.7)
HCT: 38.9 % (ref 36.0–46.0)
Hemoglobin: 13.1 g/dL (ref 12.0–15.0)
Lymphocytes Relative: 34.4 % (ref 12.0–46.0)
Lymphs Abs: 2 10*3/uL (ref 0.7–4.0)
MCHC: 33.8 g/dL (ref 30.0–36.0)
Neutro Abs: 3 10*3/uL (ref 1.4–7.7)
RBC: 4.53 Mil/uL (ref 3.87–5.11)
RDW: 13.7 % (ref 11.5–14.6)

## 2010-11-27 LAB — BASIC METABOLIC PANEL
CO2: 30 mEq/L (ref 19–32)
Calcium: 10 mg/dL (ref 8.4–10.5)
Creatinine, Ser: 0.8 mg/dL (ref 0.4–1.2)
GFR: 83.58 mL/min (ref >60.00)
Sodium: 143 mEq/L (ref 135–145)

## 2010-11-27 LAB — HEPATIC FUNCTION PANEL
Alkaline Phosphatase: 94 U/L (ref 39–117)
Bilirubin, Direct: 0.1 mg/dL (ref 0.0–0.3)
Total Bilirubin: 0.8 mg/dL (ref 0.3–1.2)
Total Protein: 7.6 g/dL (ref 6.0–8.3)

## 2010-11-27 LAB — FOLATE: Folate: >24.8 ng/mL (ref >5.9)

## 2010-11-27 LAB — AFP TUMOR MARKER: AFP-Tumor Marker: 4.7 ng/mL (ref 0.0–8.0)

## 2010-11-27 LAB — IBC PANEL
Saturation Ratios: 18 % — ABNORMAL LOW (ref 20.0–50.0)
Transferrin: 293.6 mg/dL (ref 212.0–360.0)

## 2010-11-27 LAB — VITAMIN B12: Vitamin B-12: 1483 pg/mL — ABNORMAL HIGH (ref 211–911)

## 2010-11-27 MED ORDER — CELECOXIB 200 MG PO CAPS: 200.0000 mg | ORAL_CAPSULE | Freq: Two times a day (BID) | ORAL | Status: DC

## 2010-11-27 MED ORDER — PEG-KCL-NACL-NASULF-NA ASC-C 100 G PO SOLR: 1.0000 | Freq: Once | ORAL | Status: DC

## 2010-11-27 MED ORDER — PANTOPRAZOLE SODIUM 40 MG PO TBEC: 40.0000 mg | DELAYED_RELEASE_TABLET | Freq: Every day | ORAL | Status: DC

## 2010-11-27 NOTE — Progress Notes (Signed)
This is a very nice 55 year old Caucasian female with known fatty liver but normal liver function tests. She has chronic right upper quadrant pain felt secondary to costochondritis with improvement taking Celebrex 200 mg one to 2 times a day. She denies current acid reflux symptoms, dysphagia, melena, hematochezia or lower gastrointestinal problems. Her appetite is good her weight is stable. She denies any specific hepatobiliary symptomatology.  Current Medications, Allergies, Past Medical History, Past Surgical History, Family History and Social History were reviewed in [employer] record.  Pertinent Review of Systems Negative   Physical Exam: Healthy appearing white female in no acute distress appearing her stated age. I cannot appreciate stigmata of chronic liver disease. Chest is clear and cardiac exam is unremarkable. I cannot appreciate hepatosplenomegaly, abdominal masses or abdominal tenderness, but she does have right chest wall tenderness to palpation. Bowel sounds are normal and mental status is normal.    Assessment and Plan: Osteochondritis doing fairly well on regular Celebrex therapy. We will repeat her liver function tests per her fatty liver, also alpha-fetoprotein level. I reviewed a reflux regime, and we will continue daily Protonix for GERD. Encounter Diagnoses  Name Primary?  . Abdominal pain   . Elevated LFTs   . Fatty liver   . Costochondral chest pain

## 2010-11-27 NOTE — Patient Instructions (Signed)
Your colonoscopy is scheduled on 12/22/2010 at 10:30am You can pick up your MoviPrep from your pharmacy today  Colonoscopy A colonoscopy is an exam to evaluate your entire colon. In this exam, your colon is cleansed. A long fiberoptic tube is inserted through your rectum and into your colon. The fiberoptic scope (endoscope) is a long bundle of enclosed and very flexible fibers. These fibers transmit light to the area examined and send images from that area to your caregiver. Discomfort is usually minimal. You may be given a drug to help you sleep (sedative) during or prior to the procedure. This exam helps to detect lumps (tumors), polyps, inflammation, and areas of bleeding. Your caregiver may also take a small piece of tissue (biopsy) that will be examined under a microscope. LET YOUR CAREGIVER KNOW ABOUT:   Allergies to food or medicine.   Medicines taken, including vitamins, herbs, eyedrops, over-the-counter medicines, and creams.   Use of steroids (by mouth or creams).   Previous problems with anesthetics or numbing medicines.   History of bleeding problems or blood clots.   Previous surgery.   Other health problems, including diabetes and kidney problems.   Possibility of pregnancy, if this applies.  BEFORE THE PROCEDURE   A clear liquid diet may be required for 2 days before the exam.   Ask your caregiver about changing or stopping your regular medications.   Liquid injections (enemas) or laxatives may be required.   A large amount of electrolyte solution may be given to you to drink over a short period of time. This solution is used to clean out your colon.   You should be present 60 minutes prior to your procedure or as directed by your caregiver.  AFTER THE PROCEDURE   If you received a sedative or pain relieving medication, you will need to arrange for someone to drive you home.   Occasionally, there is a little blood passed with the first bowel movement. Do not be  concerned.  FINDING OUT THE RESULTS OF YOUR TEST Not all test results are available during your visit. If your test results are not back during the visit, make an appointment with your caregiver to find out the results. Do not assume everything is normal if you have not heard from your caregiver or the medical facility. It is important for you to follow up on all of your test results. HOME CARE INSTRUCTIONS   It is not unusual to pass moderate amounts of gas and experience mild abdominal cramping following the procedure. This is due to air being used to inflate your colon during the exam. Walking or a warm pack on your belly (abdomen) may help.   You may resume all normal meals and activities after sedatives and medicines have worn off.   Only take over-the-counter or prescription medicines for pain, discomfort, or fever as directed by your caregiver. Do not use aspirin or blood thinners if a biopsy was taken. Consult your caregiver for medicine usage if biopsies were taken.  SEEK IMMEDIATE MEDICAL CARE IF:   You have a fever.   You pass large blood clots or fill a toilet with blood following the procedure. This may also occur 10 to 14 days following the procedure. This is more likely if a biopsy was taken.   You develop abdominal pain that keeps getting worse and cannot be relieved with medicine.  Document Released: 01/10/2000 Document Revised: 09/24/2010 Document Reviewed: 08/25/2007 [facility] Patient Information 2012 [place], [place].

## 2010-12-01 ENCOUNTER — Ambulatory Visit
Admission: RE | Admit: 2010-12-01 | Discharge: 2010-12-01 | Disposition: A | Discharge status: Home / Self Care | Payer: 59 | Source: Ambulatory Visit | Attending: Obstetrics and Gynecology | Admitting: Obstetrics and Gynecology

## 2010-12-01 DIAGNOSIS — Z1231 Encounter for screening mammogram for malignant neoplasm of breast: Secondary | ICD-10-CM

## 2010-12-02 ENCOUNTER — Encounter: Payer: Self-pay | Admitting: *Deleted

## 2010-12-02 NOTE — Telephone Encounter (Signed)
Opened in error sent staff  msg

## 2010-12-02 NOTE — Telephone Encounter (Signed)
Message copied by Florene Glen on Tue Dec 02, 2010  2:16 PM ------      Message from: PATTER[place], [place] R      Created: Fri Nov 28, 2010  8:33 AM       Liver ultrasound and alpha-fetoprotein level in 6 months.

## 2010-12-05 ENCOUNTER — Other Ambulatory Visit: Payer: Self-pay | Admitting: Obstetrics and Gynecology

## 2010-12-05 DIAGNOSIS — R928 Other abnormal and inconclusive findings on diagnostic imaging of breast: Secondary | ICD-10-CM

## 2010-12-22 ENCOUNTER — Encounter: Payer: Self-pay | Admitting: Gastroenterology

## 2010-12-22 ENCOUNTER — Ambulatory Visit (AMBULATORY_SURGERY_CENTER): Payer: 59 | Admitting: Gastroenterology

## 2010-12-22 VITALS — BP 137/105 | HR 63 | Temp 98.2°F | Resp 20 | Ht 66.0 in | Wt 268.0 lb

## 2010-12-22 DIAGNOSIS — K219 Gastro-esophageal reflux disease without esophagitis: Secondary | ICD-10-CM

## 2010-12-22 DIAGNOSIS — K5730 Diverticulosis of large intestine without perforation or abscess without bleeding: Secondary | ICD-10-CM | POA: Insufficient documentation

## 2010-12-22 DIAGNOSIS — K635 Polyp of colon: Secondary | ICD-10-CM

## 2010-12-22 DIAGNOSIS — D126 Benign neoplasm of colon, unspecified: Secondary | ICD-10-CM | POA: Insufficient documentation

## 2010-12-22 DIAGNOSIS — R109 Unspecified abdominal pain: Secondary | ICD-10-CM

## 2010-12-22 DIAGNOSIS — Z1211 Encounter for screening for malignant neoplasm of colon: Secondary | ICD-10-CM

## 2010-12-22 DIAGNOSIS — Z800 Family history of malignant neoplasm of digestive organs: Secondary | ICD-10-CM | POA: Insufficient documentation

## 2010-12-22 HISTORY — DX: Polyp of colon: K63.5

## 2010-12-22 MED ORDER — SODIUM CHLORIDE 0.9 % IV SOLN: 500.0000 mL | INTRAVENOUS | Status: DC

## 2010-12-22 NOTE — Progress Notes (Signed)
Patient did not experience any of the following events: a burn prior to discharge; a fall within the facility; wrong site/side/patient/procedure/implant event; or a hospital transfer or hospital admission upon discharge from the facility. (G8907) Patient did not have preoperative order for IV antibiotic SSI prophylaxis. (G8918)  

## 2010-12-22 NOTE — Patient Instructions (Signed)
PLEASE FOLLOW DISCHARGE INSTRUCTIONS GIVEN TODAY. SEE HANDOUTS. COLON POLYPS X2 REMOVED TODAY AND SENT TO LAB, YOU WILL RECEIVE RESULT LETTER IN YOUR MAIL IN 1-2 WEEKS. RESUME CURRENT MEDICATIONS. TRY TO FOLLOW HIGH FIBER DIET WITH LIBERAL FLUID INTAKE. REPEAT COLONOSCOPY IN 5 YEARS. CALL us WITH ANY QUESTIONS OR CONCERNS. THANK YOU!!

## 2010-12-23 ENCOUNTER — Ambulatory Visit
Admission: RE | Admit: 2010-12-23 | Discharge: 2010-12-23 | Disposition: A | Discharge status: Home / Self Care | Payer: 59 | Source: Ambulatory Visit | Attending: Obstetrics and Gynecology | Admitting: Obstetrics and Gynecology

## 2010-12-23 ENCOUNTER — Telehealth: Payer: Self-pay | Admitting: *Deleted

## 2010-12-23 DIAGNOSIS — R928 Other abnormal and inconclusive findings on diagnostic imaging of breast: Secondary | ICD-10-CM

## 2010-12-23 NOTE — Telephone Encounter (Signed)
Follow up Call- Patient questions:  Do you have a fever, pain , or abdominal swelling? no Pain Score  0 *  Have you tolerated food without any problems? yes  Have you been able to return to your normal activities? yes  Do you have any questions about your discharge instructions: Diet   no Medications  no Follow up visit  no  Do you have questions or concerns about your Care? no  Actions: * If pain score is 4 or above: No action needed, pain <4. Questions answered by husband pt. Already at work.

## 2010-12-26 ENCOUNTER — Encounter: Payer: Self-pay | Admitting: Gastroenterology

## 2011-02-25 ENCOUNTER — Other Ambulatory Visit: Payer: Self-pay | Admitting: Physician Assistant

## 2011-04-10 ENCOUNTER — Other Ambulatory Visit: Payer: Self-pay | Admitting: Physician Assistant

## 2011-04-16 ENCOUNTER — Encounter: Payer: Self-pay | Admitting: Physician Assistant

## 2011-04-16 ENCOUNTER — Ambulatory Visit (INDEPENDENT_AMBULATORY_CARE_PROVIDER_SITE_OTHER): Payer: 59 | Admitting: Physician Assistant

## 2011-04-16 VITALS — BP 151/70 | HR 54 | Temp 96.9°F | Resp 16 | Ht 65.0 in | Wt 255.0 lb

## 2011-04-16 DIAGNOSIS — K760 Fatty (change of) liver, not elsewhere classified: Secondary | ICD-10-CM

## 2011-04-16 DIAGNOSIS — E039 Hypothyroidism, unspecified: Secondary | ICD-10-CM

## 2011-04-16 DIAGNOSIS — Z23 Encounter for immunization: Secondary | ICD-10-CM

## 2011-04-16 DIAGNOSIS — K219 Gastro-esophageal reflux disease without esophagitis: Secondary | ICD-10-CM

## 2011-04-16 DIAGNOSIS — E079 Disorder of thyroid, unspecified: Secondary | ICD-10-CM

## 2011-04-16 DIAGNOSIS — J309 Allergic rhinitis, unspecified: Secondary | ICD-10-CM

## 2011-04-16 DIAGNOSIS — Z0000 Encounter for general adult medical examination without abnormal findings: Secondary | ICD-10-CM

## 2011-04-16 DIAGNOSIS — I10 Essential (primary) hypertension: Secondary | ICD-10-CM

## 2011-04-16 DIAGNOSIS — J301 Allergic rhinitis due to pollen: Secondary | ICD-10-CM

## 2011-04-16 DIAGNOSIS — E669 Obesity, unspecified: Secondary | ICD-10-CM

## 2011-04-16 MED ORDER — LOSARTAN POTASSIUM-HCTZ 100-25 MG PO TABS: 1.0000 | ORAL_TABLET | Freq: Every day | ORAL | Status: DC

## 2011-04-16 MED ORDER — PROPRANOLOL HCL ER BEADS 80 MG PO CP24: 80.0000 mg | ORAL_CAPSULE | Freq: Every day | ORAL | Status: DC

## 2011-04-16 MED ORDER — POTASSIUM CHLORIDE CRYS ER 20 MEQ PO TBCR: 20.0000 meq | EXTENDED_RELEASE_TABLET | Freq: Every day | ORAL | Status: DC

## 2011-04-16 NOTE — Progress Notes (Signed)
Subjective:    Patient ID: Catherine Navarro, female    DOB: 21-Nov-1955, 56 y.o.   MRN: [id number]  HPI  Presents for Wellness exam.  Breast/pap performed by GYN.  Colonoscopy in 11/2010, revealed hyperplastic polyp.  To repeat in 5 years.  Father died in 02-06-23, probable colon cancer, complicated by multiple PE.  Reports some burning in left nostril.  Resolves with saline nasal spray.  No nasal/sinus congestion.  Review of Systems  Constitutional: Negative.   HENT: Negative.   Eyes: Negative.   Respiratory: Negative.   Cardiovascular: Negative.   Gastrointestinal: Negative.   Genitourinary: Negative.   Musculoskeletal: Positive for myalgias (right lower rib margin.  Celebrex helps.  Worse with playing with her grandchildren. Chronic.). Negative for back pain, joint swelling, arthralgias and gait problem.  Skin: Negative.   Neurological: Negative.   Hematological: Negative.   Psychiatric/Behavioral: Negative.        Objective:   Physical Exam  Vitals reviewed. Constitutional: She is oriented to person, place, and time. She appears well-developed and well-nourished. No distress.  HENT:  Head: Normocephalic and atraumatic.  Right Ear: Hearing, tympanic membrane, external ear and ear canal normal. No foreign bodies.  Left Ear: Hearing, tympanic membrane, external ear and ear canal normal. No foreign bodies.  Nose: Nose normal.  Mouth/Throat: Uvula is midline, oropharynx is clear and moist and mucous membranes are normal. No oral lesions. Normal dentition. No dental abscesses or uvula swelling. No oropharyngeal exudate.  Eyes: Conjunctivae and EOM are normal. Pupils are equal, round, and reactive to light. Right eye exhibits no discharge. Left eye exhibits no discharge. No scleral icterus.  Fundoscopic exam:      The right eye shows no arteriolar narrowing, no AV nicking, no exudate, no hemorrhage and no papilledema. The right eye shows red reflex.The right eye shows no venous  pulsations.      The left eye shows no arteriolar narrowing, no AV nicking, no exudate, no hemorrhage and no papilledema. The left eye shows red reflex.The left eye shows no venous pulsations. Neck: Trachea normal, normal range of motion and full passive range of motion without pain. Neck supple. No spinous process tenderness and no muscular tenderness present. No mass and no thyromegaly present.  Cardiovascular: Normal rate, regular rhythm, normal heart sounds, intact distal pulses and normal pulses.   Pulmonary/Chest: Effort normal and breath sounds normal.  Abdominal: Soft. Normal appearance and bowel sounds are normal. She exhibits no distension and no mass. There is no hepatosplenomegaly. There is no tenderness. There is no rigidity, no rebound, no guarding, no CVA tenderness, no tenderness at McBurney's point and negative Murphy's sign. No hernia.    Genitourinary: Vagina normal.  Musculoskeletal: She exhibits no edema and no tenderness.       Cervical back: Normal.       Thoracic back: Normal.       Lumbar back: Normal.  Lymphadenopathy:       Head (right side): No tonsillar, no preauricular, no posterior auricular and no occipital adenopathy present.       Head (left side): No tonsillar, no preauricular, no posterior auricular and no occipital adenopathy present.    She has no cervical adenopathy.  Neurological: She is alert and oriented to person, place, and time. She has normal strength and normal reflexes. No cranial nerve deficit. She exhibits normal muscle tone. Coordination and gait normal.  Skin: Skin is warm, dry and intact. No rash noted. She is not diaphoretic. No cyanosis or  erythema. Nails show no clubbing.  Psychiatric: She has a normal mood and affect. Her speech is normal and behavior is normal. Judgment and thought content normal.          Assessment & Plan:   1. Routine general medical examination at a health care facility    2. Thyroid dysfunction    3. HTN  (hypertension)  losartan-hydrochlorothiazide (HYZAAR) 100-25 MG per tablet, potassium chloride SA (K-DUR,KLOR-CON) 20 MEQ tablet, propranolol (INNOPRAN XL) 80 MG 24 hr capsule  4. Obesity, unspecified    5. AR (allergic rhinitis)    6. GERD (gastroesophageal reflux disease)    7. Fatty liver disease, nonalcoholic    8. Flu vaccine need

## 2011-04-16 NOTE — Patient Instructions (Signed)

## 2011-06-01 ENCOUNTER — Telehealth: Payer: Self-pay | Admitting: *Deleted

## 2011-06-01 DIAGNOSIS — R7989 Other specified abnormal findings of blood chemistry: Secondary | ICD-10-CM

## 2011-06-01 NOTE — Telephone Encounter (Signed)
Informed and scheduled pt for U/S of Liver and AFP. U/S 06/03/11 at [facility] arrived at 0845am, NPO after midnight; pt stated understanding.

## 2011-06-01 NOTE — Telephone Encounter (Signed)
Message copied by Florene Glen on Mon Jun 01, 2011  9:43 AM ------      Message from: Florene Glen      Created: Tue Dec 02, 2010  2:16 PM       Liver u/s and afp in 6 months

## 2011-06-03 ENCOUNTER — Other Ambulatory Visit: Payer: 59

## 2011-06-03 ENCOUNTER — Ambulatory Visit (HOSPITAL_COMMUNITY)
Admission: RE | Admit: 2011-06-03 | Discharge: 2011-06-03 | Disposition: A | Discharge status: Home / Self Care | Payer: 59 | Source: Ambulatory Visit | Attending: Gastroenterology | Admitting: Gastroenterology

## 2011-06-03 DIAGNOSIS — R7989 Other specified abnormal findings of blood chemistry: Secondary | ICD-10-CM

## 2011-06-03 DIAGNOSIS — Z9089 Acquired absence of other organs: Secondary | ICD-10-CM | POA: Insufficient documentation

## 2011-06-03 LAB — AFP TUMOR MARKER: AFP-Tumor Marker: <1.3 ng/mL (ref 0.0–8.0)

## 2011-12-30 ENCOUNTER — Other Ambulatory Visit: Payer: Self-pay | Admitting: Obstetrics and Gynecology

## 2011-12-30 DIAGNOSIS — Z1231 Encounter for screening mammogram for malignant neoplasm of breast: Secondary | ICD-10-CM

## 2012-01-15 ENCOUNTER — Ambulatory Visit
Admission: RE | Admit: 2012-01-15 | Discharge: 2012-01-15 | Disposition: A | Discharge status: Home / Self Care | Payer: 59 | Source: Ambulatory Visit | Attending: Obstetrics and Gynecology | Admitting: Obstetrics and Gynecology

## 2012-01-15 DIAGNOSIS — Z1231 Encounter for screening mammogram for malignant neoplasm of breast: Secondary | ICD-10-CM

## 2012-03-30 ENCOUNTER — Telehealth: Payer: Self-pay | Admitting: Gastroenterology

## 2012-03-30 MED ORDER — PANTOPRAZOLE SODIUM 40 MG PO TBEC: 40.0000 mg | DELAYED_RELEASE_TABLET | Freq: Every day | ORAL | Status: DC

## 2012-04-05 ENCOUNTER — Encounter: Payer: Self-pay | Admitting: *Deleted

## 2012-04-21 ENCOUNTER — Encounter: Payer: Self-pay | Admitting: Physician Assistant

## 2012-04-21 ENCOUNTER — Ambulatory Visit (INDEPENDENT_AMBULATORY_CARE_PROVIDER_SITE_OTHER): Payer: 59 | Admitting: Physician Assistant

## 2012-04-21 VITALS — BP 147/75 | HR 64 | Temp 98.2°F | Resp 16 | Ht 66.0 in | Wt 269.0 lb

## 2012-04-21 DIAGNOSIS — E042 Nontoxic multinodular goiter: Secondary | ICD-10-CM

## 2012-04-21 DIAGNOSIS — R739 Hyperglycemia, unspecified: Secondary | ICD-10-CM

## 2012-04-21 DIAGNOSIS — R7309 Other abnormal glucose: Secondary | ICD-10-CM

## 2012-04-21 DIAGNOSIS — I10 Essential (primary) hypertension: Secondary | ICD-10-CM

## 2012-04-21 LAB — POCT GLYCOSYLATED HEMOGLOBIN (HGB A1C): Hemoglobin A1C: 5.7

## 2012-04-21 LAB — COMPREHENSIVE METABOLIC PANEL
ALT: 30 U/L (ref 0–35)
Alkaline Phosphatase: 84 U/L (ref 39–117)
Creat: 0.65 mg/dL (ref 0.50–1.10)
Sodium: 139 mEq/L (ref 135–145)
Total Bilirubin: 0.3 mg/dL (ref 0.3–1.2)
Total Protein: 7.1 g/dL (ref 6.0–8.3)

## 2012-04-21 MED ORDER — LOSARTAN POTASSIUM-HCTZ 100-25 MG PO TABS: 1.0000 | ORAL_TABLET | Freq: Every day | ORAL | Status: DC

## 2012-04-21 MED ORDER — PROPRANOLOL HCL ER BEADS 80 MG PO CP24: 80.0000 mg | ORAL_CAPSULE | Freq: Every day | ORAL | Status: DC

## 2012-04-21 MED ORDER — POTASSIUM CHLORIDE CRYS ER 20 MEQ PO TBCR: 20.0000 meq | EXTENDED_RELEASE_TABLET | Freq: Every day | ORAL | Status: DC

## 2012-04-21 NOTE — Patient Instructions (Addendum)
Please work on healthy eating choices and getting regular exercise-150 minutes each week.

## 2012-04-21 NOTE — Progress Notes (Signed)
Subjective:    Patient ID: Catherine Navarro, female    DOB: [birth year], 57 y.o.   MRN: [id number]  HPI This 57 y.o. female presents for evaluation of recent elevated A1C.  She had labs drawn at a health fair at work.    A1C 6.6% Non-fasting glucose 83 TC 162 HDL 56 BP 136/86 Weight 268 lbs BMI 44.9  She's had occasional glucose readings >100 in the past, though at this point we are not certain which were fasting and which were not.  A1C has always been less than 6%.  Past Medical History  Diagnosis Date  . Hiatal hernia   . Gastritis   . Pancreatitis   . Sinusitis   . IBS (irritable bowel syndrome)   . GERD (gastroesophageal reflux disease)   . Hypothyroidism     with nodules  . Osteoarthritis   . HTN (hypertension)   . Anisocoria     right eye  . Family history of colon cancer   . Abdominal pain 11/27/2010  . Elevated LFTs 11/27/2010    Past Surgical History  Procedure Laterality Date  . Tubal ligation    . Patella reconstruction      pins  . Cholecystectomy    . Anal fistulectomy    . Eye surgery      Prior to Admission medications   Medication Sig Start Date End Date Taking? Authorizing Provider  aspirin 81 MG tablet Take 81 mg by mouth daily.     Yes Historical Provider, MD  celecoxib (CELEBREX) 200 MG capsule Take 1 capsule (200 mg total) by mouth 2 (two) times daily. 11/27/10  Yes Mardella Layman, MD  Cholecalciferol (VITAMIN D) 2000 UNITS CAPS Take by mouth.     Yes Historical Provider, MD  Garlic 400 MG TABS Take by mouth.     Yes Historical Provider, MD  loratadine (CLARITIN REDITABS) 10 MG dissolvable tablet Take 10 mg by mouth daily.     Yes Historical Provider, MD  losartan-hydrochlorothiazide (HYZAAR) 100-25 MG per tablet Take 1 tablet by mouth daily. 04/16/11  Yes Yuriel Lopezmartinez S Desa Rech, PA-C  Multiple Vitamin (MULTIVITAMIN PO) Take by mouth 1 day or 1 dose.     Yes Historical Provider, MD  pantoprazole (PROTONIX) 40 MG tablet Take 1 tablet (40 mg total)  by mouth daily. 03/30/12  Yes Mardella Layman, MD  potassium chloride SA (K-DUR,KLOR-CON) 20 MEQ tablet Take 1 tablet (20 mEq total) by mouth daily. 04/16/11  Yes Zuma Hust S Hiliana Eilts, PA-C  propranolol (INNOPRAN XL) 80 MG 24 hr capsule Take 1 capsule (80 mg total) by mouth at bedtime. 04/16/11  Yes Kevonta Phariss S Violet Seabury, PA-C  vitamin B-12 (CYANOCOBALAMIN) 1000 MCG tablet Take 1,000 mcg by mouth daily.   Yes Historical Provider, MD    Allergies  Allergen Reactions  . Codeine     REACTION: headache    History   Social History  . Marital Status: Divorced    Spouse Name: BF: Charlann Noss    Number of Children: 3  . Years of Education: 12+   Occupational History  . IT Support Other  .     Social History Main Topics  . Smoking status: Former Smoker    Types: Cigarettes    Quit date: 01/26/1998  . Smokeless tobacco: Never Used  . Alcohol Use: No  . Drug Use: No  . Sexually Active: Not on file   Other Topics Concern  . Not on file   Social History Narrative  Lives with her boyfriend, Charlann Noss    Family History  Problem Relation Age of Onset  . Cancer Maternal Aunt     breast  . Diabetes Mother   . Irritable bowel syndrome Mother   . Heart attack Paternal Grandfather   . COPD Father     emphysema  . Cancer Brother 50    colorectal cancer  . Alzheimer's disease Paternal Grandmother   . Alzheimer's disease Maternal Aunt    Review of Systems No chest pain, SOB, HA, dizziness, vision change, N/V, diarrhea, constipation, dysuria, urinary urgency or frequency, or rash.  She has had LEFT sided neck pain for 4 days.  No trauma or injury.  Pain increases with ROM and with raising the LEFT arm.  She's thinking about going to the chiropractor.     Objective:   Physical Exam Blood pressure 147/75, pulse 64, temperature 98.2 F (36.8 C), resp. rate 16, height 5\' 6"  (1.676 m), weight 269 lb (122.018 kg). Body mass index is 43.44 kg/(m^2). Well-developed, well nourished WF who is  awake, alert and oriented, in NAD. HEENT: [place]/AT, sclera and conjunctiva are clear.  RIGHT eye anisocoria noted.  Neck: supple, non-tender, no lymphadenopathy, thyromegaly. Posteriorly, she has some pain in the upper paraspinous muscles of the cervical spine.  No boney tenderness. Heart: RRR, no murmur Lungs: normal effort, CTA Extremities: no cyanosis, clubbing or edema. Skin: warm and dry without rash. Psychologic: good mood and appropriate affect, normal speech and behavior.   Results for orders placed in visit on 04/21/12  GLUCOSE, POCT (MANUAL RESULT ENTRY)      Result Value Range   POC Glucose 117 (*) 70 - 99 mg/dl  POCT GLYCOSYLATED HEMOGLOBIN (HGB A1C)      Result Value Range   Hemoglobin A1C 5.7        Assessment & Plan:  Hyperglycemia - Plan: POCT glucose (manual entry), POCT glycosylated hemoglobin (Hb A1C); healthy eating, regular exercise and weight loss. Recheck in 3 months.  HTN (hypertension) - Plan: Comprehensive metabolic panel, propranolol (INNOPRAN XL) 80 MG 24 hr capsule, losartan-hydrochlorothiazide (HYZAAR) 100-25 MG per tablet, potassium chloride SA (K-DUR,KLOR-CON) 20 MEQ tablet  Multinodular goiter - Plan: TSH  Neck pain - Plan: she'll increase her celebrex from prn to daily for the next few days, and apply a warm compress.  If symptoms persist, consider xrays, muscle relaxer.

## 2012-04-22 NOTE — Progress Notes (Signed)
CPE rescheduled with Chelle for 07/26/12.

## 2012-04-26 ENCOUNTER — Encounter: Payer: Self-pay | Admitting: Gastroenterology

## 2012-05-19 ENCOUNTER — Encounter: Payer: Self-pay | Admitting: Emergency Medicine

## 2012-05-19 ENCOUNTER — Encounter: Payer: Self-pay | Admitting: *Deleted

## 2012-05-25 ENCOUNTER — Other Ambulatory Visit: Payer: Self-pay | Admitting: Gastroenterology

## 2012-05-26 ENCOUNTER — Ambulatory Visit (INDEPENDENT_AMBULATORY_CARE_PROVIDER_SITE_OTHER): Payer: 59 | Admitting: Gastroenterology

## 2012-05-26 ENCOUNTER — Encounter: Payer: Self-pay | Admitting: Gastroenterology

## 2012-05-26 DIAGNOSIS — K219 Gastro-esophageal reflux disease without esophagitis: Secondary | ICD-10-CM

## 2012-05-26 DIAGNOSIS — R1011 Right upper quadrant pain: Secondary | ICD-10-CM

## 2012-05-26 DIAGNOSIS — G8929 Other chronic pain: Secondary | ICD-10-CM

## 2012-05-26 DIAGNOSIS — Z9889 Other specified postprocedural states: Secondary | ICD-10-CM

## 2012-05-26 DIAGNOSIS — K760 Fatty (change of) liver, not elsewhere classified: Secondary | ICD-10-CM

## 2012-05-26 DIAGNOSIS — K7689 Other specified diseases of liver: Secondary | ICD-10-CM

## 2012-05-26 DIAGNOSIS — Z9049 Acquired absence of other specified parts of digestive tract: Secondary | ICD-10-CM

## 2012-05-26 MED ORDER — CELECOXIB 200 MG PO CAPS: 200.0000 mg | ORAL_CAPSULE | Freq: Two times a day (BID) | ORAL | Status: DC

## 2012-05-26 MED ORDER — PANTOPRAZOLE SODIUM 40 MG PO TBEC: 40.0000 mg | DELAYED_RELEASE_TABLET | Freq: Every day | ORAL | Status: DC

## 2012-05-26 NOTE — Patient Instructions (Signed)
  New prescription for Celebrex and Protonix was sent to your pharmacy.  Please follow up in one year with Dr. Jarold Motto. _________________________________________________________________________                                               We are excited to introduce MyChart, a new best-in-class service that provides you online access to important information in your electronic medical record. We want to make it easier for you to view your health information - all in one secure location - when and where you need it. We expect MyChart will enhance the quality of care and service we provide.  When you register for MyChart, you can:    View your test results.    Request appointments and receive appointment reminders via email.    Request medication renewals.    View your medical history, allergies, medications and immunizations.    Communicate with your physician's office through a password-protected site.    Conveniently print information such as your medication lists.  To find out if MyChart is right for you, please talk to a member of our clinical staff today. We will gladly answer your questions about this free health and wellness tool.  If you are age 58 or older and want a member of your family to have access to your record, you must provide written consent by completing a proxy form available at our office. Please speak to our clinical staff about guidelines regarding accounts for patients younger than age 18.  As you activate your MyChart account and need any technical assistance, please call the MyChart technical support line at [phone]) or email your question to [email or web address]. If you email your question(s), please include your name, a return phone number and the best time to reach you.  If you have non-urgent health-related questions, you can send a message to our office through MyChart at [email or web address]. If you have a medical emergency,  call 911.  Thank you for using MyChart as your new health and wellness resource!   MyChart licensed from [employer],  [id number]. Patents Pending.

## 2012-05-26 NOTE — Progress Notes (Signed)
This is a very pleasant 57 year old Caucasian female with chronic obesity and fatty liver with chronic right or quadrant pain felt secondary to capsular distention of her hepatic area and also an element of costochondritis.  Ultrasound does confirm fatty liver without any evidence of hepatic masses.  Liver function test recently performed are normal.  She is on Protonix daily for GERD, denies any reflux symptoms or dysphagia.  The patient is up-to-date on her colonoscopy, and otherwise denies any general medical or gastrointestinal difficulties.  Current Medications, Allergies, Past Medical History, Past Surgical History, Family History and Social History were reviewed in [employer] record.  ROS: All systems were reviewed and are negative unless otherwise stated in the HPI.          Physical Exam: Blood pressure 134/74, pulse 66 and regular weight 266 with a BMI of 44.61.  Cannot appreciate stigmata of chronic liver disease.  Her abdominal exam shows mild hepatomegaly with a tender edge on deep inspiration.  There is no splenomegaly, other abdominal masses or tenderness.  Bowel sounds are normal.  I cannot appreciate any chest wall masses or tenderness.  Mental status is normal    Assessment and Plan: Chronic right upper quadrant pain secondary to capsular distention from fatty infiltration associated with her obesity.  I've asked her to try to control her weight is best as possible, and have renewed her Protonix for GERD and her Celebrex for musculoskeletal pain components.  We will do yearly liver function test and yearly office visits.  Last endoscopy exam was in 2007.  Standard antireflux maneuvers reviewed with patient.  I do not think she is a good candidate for bariatric surgery her age and lack of other significant comorbid medical problems. No diagnosis found. please copy her primary care physician

## 2012-05-26 NOTE — Addendum Note (Signed)
Addended by: Ok Anis A on: 05/26/2012 09:47 AM   Modules accepted: Orders

## 2012-06-06 ENCOUNTER — Other Ambulatory Visit: Payer: Self-pay | Admitting: Gastroenterology

## 2012-06-10 ENCOUNTER — Telehealth: Payer: Self-pay | Admitting: *Deleted

## 2012-06-10 ENCOUNTER — Telehealth: Payer: Self-pay | Admitting: Gastroenterology

## 2012-06-10 MED ORDER — PANTOPRAZOLE SODIUM 40 MG PO TBEC: 40.0000 mg | DELAYED_RELEASE_TABLET | Freq: Every day | ORAL | Status: DC

## 2012-06-10 NOTE — Telephone Encounter (Signed)
Patient said she called [facility] and they did not get the Protonix prescription but did receive the Celebrex prescription. Both sent same day.  I told patient I will send Protonix again and if they do not receive it to call me and I will call them.

## 2012-06-10 NOTE — Telephone Encounter (Signed)
Called patient back and left message.

## 2012-06-28 ENCOUNTER — Encounter: Payer: 59 | Admitting: Physician Assistant

## 2012-07-26 ENCOUNTER — Encounter: Payer: Self-pay | Admitting: Physician Assistant

## 2012-07-26 ENCOUNTER — Ambulatory Visit (INDEPENDENT_AMBULATORY_CARE_PROVIDER_SITE_OTHER): Payer: 59 | Admitting: Physician Assistant

## 2012-07-26 VITALS — BP 148/84 | HR 58 | Temp 98.2°F | Resp 18 | Ht 65.25 in | Wt 255.8 lb

## 2012-07-26 DIAGNOSIS — H5702 Anisocoria: Secondary | ICD-10-CM

## 2012-07-26 DIAGNOSIS — J309 Allergic rhinitis, unspecified: Secondary | ICD-10-CM

## 2012-07-26 DIAGNOSIS — E042 Nontoxic multinodular goiter: Secondary | ICD-10-CM

## 2012-07-26 DIAGNOSIS — R7309 Other abnormal glucose: Secondary | ICD-10-CM

## 2012-07-26 DIAGNOSIS — B009 Herpesviral infection, unspecified: Secondary | ICD-10-CM

## 2012-07-26 DIAGNOSIS — I10 Essential (primary) hypertension: Secondary | ICD-10-CM

## 2012-07-26 DIAGNOSIS — Z0000 Encounter for general adult medical examination without abnormal findings: Secondary | ICD-10-CM

## 2012-07-26 DIAGNOSIS — R739 Hyperglycemia, unspecified: Secondary | ICD-10-CM

## 2012-07-26 DIAGNOSIS — B001 Herpesviral vesicular dermatitis: Secondary | ICD-10-CM

## 2012-07-26 LAB — CBC WITH DIFFERENTIAL/PLATELET
Eosinophils Absolute: 0.1 10*3/uL (ref 0.0–0.7)
Hemoglobin: 13 g/dL (ref 12.0–15.0)
Lymphocytes Relative: 32 % (ref 12–46)
Lymphs Abs: 1.8 10*3/uL (ref 0.7–4.0)
MCH: 27.7 pg (ref 26.0–34.0)
MCV: 81.9 fL (ref 78.0–100.0)
Monocytes Relative: 8 % (ref 3–12)
Neutrophils Relative %: 56 % (ref 43–77)
Platelets: 205 10*3/uL (ref 150–400)
RBC: 4.69 MIL/uL (ref 3.87–5.11)
WBC: 5.7 10*3/uL (ref 4.0–10.5)

## 2012-07-26 LAB — COMPREHENSIVE METABOLIC PANEL
ALT: 22 U/L (ref 0–35)
Albumin: 4.4 g/dL (ref 3.5–5.2)
CO2: 28 mEq/L (ref 19–32)
Calcium: 9.6 mg/dL (ref 8.4–10.5)
Chloride: 104 mEq/L (ref 96–112)
Glucose, Bld: 87 mg/dL (ref 70–99)
Sodium: 141 mEq/L (ref 135–145)
Total Bilirubin: 0.5 mg/dL (ref 0.3–1.2)
Total Protein: 7 g/dL (ref 6.0–8.3)

## 2012-07-26 LAB — LIPID PANEL
Cholesterol: 149 mg/dL (ref 0–200)
Triglycerides: 117 mg/dL (ref <150)
VLDL: 23 mg/dL (ref 0–40)

## 2012-07-26 LAB — GLUCOSE, POCT (MANUAL RESULT ENTRY): POC Glucose: 94 mg/dl (ref 70–99)

## 2012-07-26 MED ORDER — FLUTICASONE PROPIONATE 50 MCG/ACT NA SUSP: 2.0000 | Freq: Every day | NASAL | Status: DC

## 2012-07-26 MED ORDER — VALACYCLOVIR HCL 1 G PO TABS: ORAL_TABLET | ORAL | Status: DC

## 2012-07-26 NOTE — Patient Instructions (Addendum)
I will contact you with your lab results as soon as they are available.   If you have not heard from me in 2 weeks, please contact me.  The fastest way to get your results is to register for My Chart (see the instructions on the last page of this printout).  Keeping You Healthy  Get These Tests  Blood Pressure- Have your blood pressure checked by your healthcare provider at least once a year.  Normal blood pressure is 120/80.  Weight- Have your body mass index (BMI) calculated to screen for obesity.  BMI is a measure of body fat based on height and weight.  You can calculate your own BMI at www.nhlbisupport.com/bmi/  Cholesterol- Have your cholesterol checked every year.  Diabetes- Have your blood sugar checked every year if you have high blood pressure, high cholesterol, a family history of diabetes or if you are overweight.  Pap Smear- Have a pap smear every 1 to 3 years if you have been sexually active.  If you are older than 65 and recent pap smears have been normal you may not need additional pap smears.  In addition, if you have had a hysterectomy  For benign disease additional pap smears are not necessary.  Mammogram-Yearly mammograms are essential for early detection of breast cancer  Screening for Colon Cancer- Colonoscopy starting at age 50. Screening may begin sooner depending on your family history and other health conditions.  Follow up colonoscopy as directed by your Gastroenterologist.  Screening for Osteoporosis- Screening begins at age 65 with bone density scanning, sooner if you are at higher risk for developing Osteoporosis.  Get these medicines  Calcium with Vitamin D- Your body requires 1200-1500 mg of Calcium a day and 800-1000 IU of Vitamin D a day.  You can only absorb 500 mg of Calcium at a time therefore Calcium must be taken in 2 or 3 separate doses throughout the day.  Hormones- Hormone therapy has been associated with increased risk for certain cancers and  heart disease.  Talk to your healthcare provider about if you need relief from menopausal symptoms.  Aspirin- Ask your healthcare provider about taking Aspirin to prevent Heart Disease and Stroke.  Get these Immuniztions  Flu shot- Every fall  Pneumonia shot- Once after the age of 65; if you are younger ask your healthcare provider if you need a pneumonia shot.  Tetanus- Every ten years.  Zostavax- Once after the age of 60 to prevent shingles.  Take these steps  Don't smoke- Your healthcare provider can help you quit. For tips on how to quit, ask your healthcare provider or go to www.smokefree.gov or call 1-800 QUIT-NOW.  Be physically active- Exercise 5 days a week for a minimum of 30 minutes.  If you are not already physically active, start slow and gradually work up to 30 minutes of moderate physical activity.  Try walking, dancing, bike riding, swimming, etc.  Eat a healthy diet- Eat a variety of healthy foods such as fruits, vegetables, whole grains, low fat milk, low fat cheeses, yogurt, lean meats, chicken, fish, eggs, dried beans, tofu, etc.  For more information go to www.thenutritionsource.org  Dental visit- Brush and floss teeth twice daily; visit your dentist twice a year.  Eye exam- Visit your Optometrist or Ophthalmologist yearly.  Drink alcohol in moderation- Limit alcohol intake to one drink or less a day.  Never drink and drive.  Depression- Your emotional health is as important as your physical health.  If you're feeling   down or losing interest in things you normally enjoy, please talk to your healthcare provider.  Seat Belts- can save your life; always wear one  Smoke/Carbon Monoxide detectors- These detectors need to be installed on the appropriate level of your home.  Replace batteries at least once a year.  Violence- If anyone is threatening or hurting you, please tell your healthcare provider.  Living Will/ Health care power of attorney- Discuss with your  healthcare provider and family. 

## 2012-07-26 NOTE — Progress Notes (Signed)
  Subjective:    Patient ID: Catherine Navarro, female    DOB: 08/21/1955, 57 y.o.   MRN: [id number]  HPI    Review of Systems  Constitutional: Negative.   HENT: Negative.   Eyes: Negative.   Respiratory: Negative.   Cardiovascular: Negative.   Gastrointestinal: Negative.   Endocrine: Negative.   Genitourinary: Negative.   Musculoskeletal: Negative.   Skin: Negative.   Allergic/Immunologic: Negative.   Neurological: Negative.   Hematological: Negative.   Psychiatric/Behavioral: Negative.        Objective:   Physical Exam        Assessment & Plan:

## 2012-07-26 NOTE — Progress Notes (Signed)
Subjective:    Patient ID: Catherine Navarro, female    DOB: 1955/11/22, 57 y.o.   MRN: [id number]  HPI Catherine Navarro is a 57 y.o. Caucasian female presenting for CPE.  Her last visit was 3 mos ago.  Overall, she describes being well.  Her allergies continue to bother her.  She also has concern over an internal growth within or near her left nostril.  She has the sensation of a faint toothache and the area feels like a small knot.  She has a history of fever blisters within her nasal passages.    Propranolol was added at her last visit.  She has been tolerating it well with the exception of fatigue at the initiation of the medication.  She did not take it this morning due to fasting for labs.  She does not check her blood pressure at home.    She is up-to-date on her immunizations.  Her last PAP smear was in 10/2009.  She is seen by Gynecology regularly and plans to schedule a PAP for this fall.  Her last Mammogram was in 2013, she is due in 12/2013.  Her last colonoscopy was in 11/12, she is due in 11/17.  Prior to Admission medications   Medication Sig Start Date End Date Taking? Authorizing Provider  aspirin 81 MG tablet Take 81 mg by mouth daily.     Yes Historical Provider, MD  celecoxib (CELEBREX) 200 MG capsule Take 1 capsule (200 mg total) by mouth 2 (two) times daily. 05/26/12  Yes Mardella Layman, MD  Cholecalciferol (VITAMIN D) 2000 UNITS CAPS Take by mouth.     Yes Historical Provider, MD  fish oil-omega-3 fatty acids 1000 MG capsule Take 1 g by mouth every other day.   Yes Historical Provider, MD  Garlic 400 MG TABS Take by mouth.     Yes Historical Provider, MD  loratadine (CLARITIN REDITABS) 10 MG dissolvable tablet Take 10 mg by mouth daily.     Yes Historical Provider, MD  losartan-hydrochlorothiazide (HYZAAR) 100-25 MG per tablet Take 1 tablet by mouth daily. 04/21/12  Yes Chelle S Jeffery, PA-C  Multiple Vitamin (MULTIVITAMIN PO) Take by mouth 1 day or 1 dose.     Yes  Historical Provider, MD  pantoprazole (PROTONIX) 40 MG tablet Take 1 tablet (40 mg total) by mouth daily. 06/10/12  Yes Mardella Layman, MD  potassium chloride SA (K-DUR,KLOR-CON) 20 MEQ tablet Take 1 tablet (20 mEq total) by mouth daily. 04/21/12  Yes Chelle S Jeffery, PA-C  propranolol (INNOPRAN XL) 80 MG 24 hr capsule Take 1 capsule (80 mg total) by mouth at bedtime. 04/21/12  Yes Chelle S Jeffery, PA-C  vitamin B-12 (CYANOCOBALAMIN) 1000 MCG tablet Take 1,000 mcg by mouth daily.   Yes Historical Provider, MD  fluticasone (FLONASE) 50 MCG/ACT nasal spray Place 2 sprays into the nose daily. 07/26/12   Chelle S Jeffery, PA-C  fluticasone (FLONASE) 50 MCG/ACT nasal spray Place 2 sprays into the nose daily. 07/26/12   Chelle Tessa Lerner, PA-C  valACYclovir (VALTREX) 1000 MG tablet Take 2 tablets AS SOON as first symptoms of fever blister appear.  Take 2 tablets 12 hours later. 07/26/12   Chelle Tessa Lerner, PA-C    Allergies  Allergen Reactions  . Codeine     REACTION: headache    Past Medical History  Diagnosis Date  . Hiatal hernia   . Gastritis   . Pancreatitis   . Sinusitis   . IBS (irritable bowel  syndrome)   . GERD (gastroesophageal reflux disease)   . Hypothyroidism     with nodules  . Osteoarthritis   . HTN (hypertension)   . Anisocoria     right eye  . Family history of colon cancer   . Abdominal pain 11/27/2010  . Elevated LFTs 11/27/2010  . Fatty infiltration of liver   . Allergy     Past Surgical History  Procedure Laterality Date  . Tubal ligation    . Patella reconstruction      pins  . Cholecystectomy    . Anal fistulectomy    . Eye surgery      History   Social History  . Marital Status: Divorced    Spouse Name: BF: Charlann Noss    Number of Children: 3  . Years of Education: 12+   Occupational History  . IT Support Other   Social History Main Topics  . Smoking status: Former Smoker    Types: Cigarettes    Quit date: 01/26/1998  . Smokeless tobacco: Never  Used  . Alcohol Use: No  . Drug Use: No  . Sexually Active: Not on file   Other Topics Concern  . Not on file   Social History Narrative   Lives with her boyfriend, Charlann Noss.    Family History  Problem Relation Age of Onset  . Breast cancer Maternal Aunt   . Diabetes Mother   . Irritable bowel syndrome Mother   . Heart attack Paternal Grandfather   . COPD Father     emphysema  . Cancer Brother 50    colorectal cancer  . Alzheimer's disease Paternal Grandmother   . Alzheimer's disease Maternal Aunt     Review of Systems As stated above.    Objective:   Physical Exam Filed Vitals:   07/26/12 0931  BP: 148/84  Pulse: 58  Temp: 98.2 F (36.8 C)  TempSrc: Oral  Resp: 18  Height: 5' 5.25" (1.657 m)  Weight: 255 lb 12.8 oz (116.03 kg)  SpO2: 98%   General:  WDWN female in no acute distress. Skin:  Soft, warm skin with good turgor.  No bruising or lesions present.  HEENT:  Ears - EACs clear.  TMs are translucent and mobile.  Hearing intact.  Nose - Patent.  Nasal mucosa is mildly inflamed.  Septum midline.  Mouth/Throat - Mucous membranes are moist and pink.  No obvious periodontal disease.   No tonsillar hypertrophy or exudate.  Neck - supple with thyromegaly. Cardiovascular: S1 and S2 distinct with no murmurs, rubs or gallops. Respiratory:  CTA with no adventitious sounds. Abdominal:  Soft, non-tender abdomen with no visible pulsations or masses.  Bowel sounds adequate.   MSK:  Full range of motion with no pain. Neuro:  Cranial nerves intact.  Reflexes 2+ throughout upper and lower extremities.  Results for orders placed in visit on 07/26/12  CBC WITH DIFFERENTIAL      Result Value Range   WBC 5.7  4.0 - 10.5 K/uL   RBC 4.69  3.87 - 5.11 MIL/uL   Hemoglobin 13.0  12.0 - 15.0 g/dL   HCT 60.4  54.0 - 98.1 %   MCV 81.9  78.0 - 100.0 fL   MCH 27.7  26.0 - 34.0 pg   MCHC 33.9  30.0 - 36.0 g/dL   RDW 19.1  47.8 - 29.5 %   Platelets 205  150 - 400 K/uL    Neutrophils Relative % 56  43 - 77 %  Neutro Abs 3.2  1.7 - 7.7 K/uL   Lymphocytes Relative 32  12 - 46 %   Lymphs Abs 1.8  0.7 - 4.0 K/uL   Monocytes Relative 8  3 - 12 %   Monocytes Absolute 0.5  0.1 - 1.0 K/uL   Eosinophils Relative 3  0 - 5 %   Eosinophils Absolute 0.1  0.0 - 0.7 K/uL   Basophils Relative 1  0 - 1 %   Basophils Absolute 0.0  0.0 - 0.1 K/uL   Smear Review Criteria for review not met    COMPREHENSIVE METABOLIC PANEL      Result Value Range   Sodium 141  135 - 145 mEq/L   Potassium 3.9  3.5 - 5.3 mEq/L   Chloride 104  96 - 112 mEq/L   CO2 28  19 - 32 mEq/L   Glucose, Bld 87  70 - 99 mg/dL   BUN 18  6 - 23 mg/dL   Creat 1.61  0.96 - 0.45 mg/dL   Total Bilirubin 0.5  0.3 - 1.2 mg/dL   Alkaline Phosphatase 88  39 - 117 U/L   AST 20  0 - 37 U/L   ALT 22  0 - 35 U/L   Total Protein 7.0  6.0 - 8.3 g/dL   Albumin 4.4  3.5 - 5.2 g/dL   Calcium 9.6  8.4 - 40.9 mg/dL  LIPID PANEL      Result Value Range   Cholesterol 149  0 - 200 mg/dL   Triglycerides 811  <914 mg/dL   HDL 49  >78 mg/dL   Total CHOL/HDL Ratio 3.0     VLDL 23  0 - 40 mg/dL   LDL Cholesterol 77  0 - 99 mg/dL  TSH      Result Value Range   TSH 0.971  0.350 - 4.500 uIU/mL  GLUCOSE, POCT (MANUAL RESULT ENTRY)      Result Value Range   POC Glucose 94  70 - 99 mg/dl  POCT GLYCOSYLATED HEMOGLOBIN (HGB A1C)      Result Value Range   Hemoglobin A1C 5.4         Assessment & Plan:  1. Routine general medical examination at a health care facility Schedule 3 month follow-up.  Call if any acute needs arise in the meantime.    2. HTN (hypertension) Requested that patient begin monitoring blood pressure at home.  Call office if numbers are consistently over >140/>90.  Discussed with patient that another blood pressure medication will be advised if this is the case.  Otherwise, we will continue Propranolol and Hyzaar as directed until her next visit.   Continue to monitor with CBC with Differential,  Comprehensive metabolic panel, and Lipid panel.  3. AR (allergic rhinitis) Patient's current regimen with Claritin alone is not effective.  Add Fluticasone (FLONASE) 50 MCG/ACT nasal spray; Place 2 sprays into the nose daily.  Dispense: 48 g; Refill: 4.  If this is unsuccessful, consider referral to ENT.  4. Multinodular goiter Stable.  Monitor with TSH.  5. Hyperglycemia Continue to monitor glucose control.  Recommend healthy eating and regular exercise.   Order POCT glucose (manual entry), POCT glycosylated hemoglobin (Hb A1C) for evaluation.  6. Anisocoria Stable.  Seen by ophthalmology regularly.    7. Fever blister Re-order ValACYclovir (VALTREX) 1000 MG tablet; Take 2 tablets AS SOON as first symptoms of fever blister appear.  Take 2 tablets 12 hours later.  Dispense: 30 tablet; Refill: 0.

## 2012-07-27 NOTE — Progress Notes (Signed)
I have examined this patient along with the student and agree. Age appropriate anticipatory guidance provided.  

## 2012-12-01 ENCOUNTER — Other Ambulatory Visit: Payer: Self-pay

## 2012-12-29 ENCOUNTER — Other Ambulatory Visit: Payer: Self-pay | Admitting: Physician Assistant

## 2013-01-06 ENCOUNTER — Ambulatory Visit (INDEPENDENT_AMBULATORY_CARE_PROVIDER_SITE_OTHER): Payer: 59 | Admitting: Emergency Medicine

## 2013-01-06 VITALS — BP 134/72 | HR 97 | Temp 99.1°F | Resp 18 | Ht 65.5 in | Wt 253.0 lb

## 2013-01-06 DIAGNOSIS — J020 Streptococcal pharyngitis: Secondary | ICD-10-CM

## 2013-01-06 DIAGNOSIS — J029 Acute pharyngitis, unspecified: Secondary | ICD-10-CM

## 2013-01-06 LAB — POCT CBC
Granulocyte percent: 78.7 %G (ref 37–80)
Lymph, poc: 1.7 (ref 0.6–3.4)
MID (cbc): 0.9 (ref 0–0.9)
MPV: 8 fL (ref 0–99.8)
POC Granulocyte: 9.4 — AB (ref 2–6.9)
POC LYMPH PERCENT: 14.2 %L (ref 10–50)
POC MID %: 7.1 %M (ref 0–12)
Platelet Count, POC: 172 10*3/uL (ref 142–424)
RDW, POC: 14.3 %

## 2013-01-06 LAB — POCT INFLUENZA A/B: Influenza A, POC: NEGATIVE

## 2013-01-06 MED ORDER — AMOXICILLIN 875 MG PO TABS: 875.0000 mg | ORAL_TABLET | Freq: Two times a day (BID) | ORAL | Status: DC

## 2013-01-06 NOTE — Progress Notes (Addendum)
This chart was scribed for Lesle Chris, MD by Joaquin Music, ED Scribe. This patient was seen in room Room/bed 4 and the patient's care was started at 2:19 PM. Subjective:    Patient ID: Catherine Navarro, female    DOB: 1955-07-10, 57 y.o.   MRN: [id number] Chief Complaint  Patient presents with  . tonsils covered in white, sore throat, chills,    started wednesday     HPI Catherine Navarro is a 57 y.o. female who presents to the [facility] complaining of ongoing worsening sore throat, max fever of 102, chills and HA with associated cough that began 2 days ago. Pt states she has not been able to eat for the past 2 days. She states she noticed 95% of her tonsils with white patches. She states when she began having symptoms, she began having "flu-like symptoms". Pt denies having a flu shot this season. Pt denies any recent sick contacts. Pt denies emesis and diarrhea.   Review of Systems  Constitutional: Positive for fever, chills and fatigue.  HENT: Positive for sore throat.   Gastrointestinal: Negative for vomiting and diarrhea.  Neurological: Positive for headaches.       Objective:   Physical Exam CONSTITUTIONAL: Well developed/well nourished HEAD: Normocephalic/atraumatic EYES: EOMI/PERRL ENMT: Mucous membranes moist tonsils are 3+ enlarged. There is thick with patches of white exudate. They're tender anterior posterior cervical nodes. There is no nuchal rigidity. NECK: supple no meningeal signs SPINE:entire spine nontender CV: S1/S2 noted, no murmurs/rubs/gallops noted LUNGS: Lungs are clear to auscultation bilaterally, no apparent distress ABDOMEN: soft, nontender, no rebound or guarding GU:no cva tenderness NEURO: Pt is awake/alert, moves all extremitiesx4 EXTREMITIES: pulses normal, full ROM SKIN: warm, color normal PSYCH: no abnormalities of mood noted Results for orders placed in visit on 01/06/13  POCT RAPID STREP A (OFFICE)      Result Value Range   Rapid Strep  A Screen Positive (*) Negative  POCT INFLUENZA A/B      Result Value Range   Influenza A, POC Negative     Influenza B, POC Negative    POCT CBC      Result Value Range   WBC 12.0 (*) 4.6 - 10.2 K/uL   Lymph, poc 1.7  0.6 - 3.4   POC LYMPH PERCENT 14.2  10 - 50 %L   MID (cbc) 0.9  0 - 0.9   POC MID % 7.1  0 - 12 %M   POC Granulocyte 9.4 (*) 2 - 6.9   Granulocyte percent 78.7  37 - 80 %G   RBC 4.81  4.04 - 5.48 M/uL   Hemoglobin 13.5  12.2 - 16.2 g/dL   HCT, POC 40.9  81.1 - 47.9 %   MCV 90.4  80 - 97 fL   MCH, POC 28.1  27 - 31.2 pg   MCHC 31.0 (*) 31.8 - 35.4 g/dL   RDW, POC 91.4     Platelet Count, POC 172  142 - 424 K/uL   MPV 8.0  0 - 99.8 fL    Triage Vitals:BP 134/72  Pulse 97  Temp(Src) 99.1 F (37.3 C) (Oral)  Resp 18  Ht 5' 5.5" (1.664 m)  Wt 253 lb (114.76 kg)  BMI 41.45 kg/m2  SpO2 97% Assessment & Plan:   1. Acute pharyngitis   2. Streptococcal sore throat    Meds ordered this encounter  Medications  . amoxicillin (AMOXIL) 875 MG tablet    Sig: Take 1 tablet (875 mg total)  by mouth 2 (two) times daily.    Dispense:  20 tablet    Refill:  0    I personally performed the services described in this documentation, which was scribed in my presence. The recorded information has been reviewed and is accurate.

## 2013-01-06 NOTE — Patient Instructions (Signed)
Strep Throat  Strep throat is an infection of the throat caused by a bacteria named Streptococcus pyogenes. Your caregiver may call the infection streptococcal "tonsillitis" or "pharyngitis" depending on whether there are signs of inflammation in the tonsils or back of the throat. Strep throat is most common in children aged 58 15 years during the cold months of the year, but it can occur in people of any age during any season. This infection is spread from person to person (contagious) through coughing, sneezing, or other close contact.  SYMPTOMS   · Fever or chills.  · Painful, swollen, red tonsils or throat.  · Pain or difficulty when swallowing.  · White or yellow spots on the tonsils or throat.  · Swollen, tender lymph nodes or "glands" of the neck or under the jaw.  · Red rash all over the body (rare).  DIAGNOSIS   Many different infections can cause the same symptoms. A test must be done to confirm the diagnosis so the right treatment can be given. A "rapid strep test" can help your caregiver make the diagnosis in a few minutes. If this test is not available, a light swab of the infected area can be used for a throat culture test. If a throat culture test is done, results are usually available in a day or two.  TREATMENT   Strep throat is treated with antibiotic medicine.  HOME CARE INSTRUCTIONS   · Gargle with 1 tsp of salt in 1 cup of warm water, 3 4 times per day or as needed for comfort.  · Family members who also have a sore throat or fever should be tested for strep throat and treated with antibiotics if they have the strep infection.  · Make sure everyone in your household washes their hands well.  · Do not share food, drinking cups, or personal items that could cause the infection to spread to others.  · You may need to eat a soft food diet until your sore throat gets better.  · Drink enough water and fluids to keep your urine clear or pale yellow. This will help prevent dehydration.  · Get plenty of  rest.  · Stay home from school, daycare, or work until you have been on antibiotics for 24 hours.  · Only take over-the-counter or prescription medicines for pain, discomfort, or fever as directed by your caregiver.  · If antibiotics are prescribed, take them as directed. Finish them even if you start to feel better.  SEEK MEDICAL CARE IF:   · The glands in your neck continue to enlarge.  · You develop a rash, cough, or earache.  · You cough up green, yellow-brown, or bloody sputum.  · You have pain or discomfort not controlled by medicines.  · Your problems seem to be getting worse rather than better.  SEEK IMMEDIATE MEDICAL CARE IF:   · You develop any new symptoms such as vomiting, severe headache, stiff or painful neck, chest pain, shortness of breath, or trouble swallowing.  · You develop severe throat pain, drooling, or changes in your voice.  · You develop swelling of the neck, or the skin on the neck becomes red and tender.  · You have a fever.  · You develop signs of dehydration, such as fatigue, dry mouth, and decreased urination.  · You become increasingly sleepy, or you cannot wake up completely.  Document Released: 01/10/2000 Document Revised: 12/30/2011 Document Reviewed: 03/13/2010  Exit[employer]® Patient Information ©2014 [employer].

## 2013-01-09 ENCOUNTER — Telehealth: Payer: Self-pay

## 2013-01-09 MED ORDER — AZITHROMYCIN 250 MG PO TABS: ORAL_TABLET | ORAL | Status: DC

## 2013-01-09 NOTE — Telephone Encounter (Signed)
Patient is calling to say she has red splotches, could this be from the antibiotic she would like a nurse or assistant to call her

## 2013-01-09 NOTE — Telephone Encounter (Signed)
Yes, she should d/c this, she is being treated for Strep, documented as allergy in her chart.  She indicates her groin and underarms are red/ she states nothing with her face or throat if anything develops there she will come in or go to ER immediatley. She will begin Zyrtec and Benadryl.  Discussed with Catherine Navarro Zpack sent in.

## 2013-01-11 ENCOUNTER — Other Ambulatory Visit: Payer: Self-pay

## 2013-01-11 DIAGNOSIS — Z1231 Encounter for screening mammogram for malignant neoplasm of breast: Secondary | ICD-10-CM

## 2013-01-24 ENCOUNTER — Ambulatory Visit
Admission: RE | Admit: 2013-01-24 | Discharge: 2013-01-24 | Disposition: A | Discharge status: Home / Self Care | Payer: 59 | Source: Ambulatory Visit

## 2013-01-24 DIAGNOSIS — Z1231 Encounter for screening mammogram for malignant neoplasm of breast: Secondary | ICD-10-CM

## 2013-01-25 ENCOUNTER — Other Ambulatory Visit (HOSPITAL_COMMUNITY)
Admission: RE | Admit: 2013-01-25 | Discharge: 2013-01-25 | Disposition: A | Discharge status: Home / Self Care | Payer: 59 | Source: Ambulatory Visit | Attending: Adult Health | Admitting: Adult Health

## 2013-01-25 ENCOUNTER — Ambulatory Visit (INDEPENDENT_AMBULATORY_CARE_PROVIDER_SITE_OTHER): Payer: 59 | Admitting: Adult Health

## 2013-01-25 ENCOUNTER — Other Ambulatory Visit: Payer: 59 | Admitting: Adult Health

## 2013-01-25 ENCOUNTER — Encounter: Payer: Self-pay | Admitting: Adult Health

## 2013-01-25 VITALS — BP 160/76 | HR 78 | Ht 65.0 in | Wt 257.0 lb

## 2013-01-25 DIAGNOSIS — Z1212 Encounter for screening for malignant neoplasm of rectum: Secondary | ICD-10-CM

## 2013-01-25 DIAGNOSIS — Z01419 Encounter for gynecological examination (general) (routine) without abnormal findings: Secondary | ICD-10-CM

## 2013-01-25 DIAGNOSIS — Q828 Other specified congenital malformations of skin: Secondary | ICD-10-CM

## 2013-01-25 DIAGNOSIS — Z1151 Encounter for screening for human papillomavirus (HPV): Secondary | ICD-10-CM | POA: Insufficient documentation

## 2013-01-25 DIAGNOSIS — Z0000 Encounter for general adult medical examination without abnormal findings: Secondary | ICD-10-CM

## 2013-01-25 HISTORY — DX: Other specified congenital malformations of skin: Q82.8

## 2013-01-25 LAB — HEMOCCULT GUIAC POC 1CARD (OFFICE): Fecal Occult Blood, POC: NEGATIVE

## 2013-01-25 NOTE — Patient Instructions (Signed)
Physical in 1 year Mammogram yearly  Colonoscopy 2017 Labs with PCP  Call when wants skin tags removed

## 2013-01-25 NOTE — Progress Notes (Signed)
Patient ID: Catherine Navarro, female   DOB: 12-08-1955, 57 y.o.   MRN: [id number] History of Present Illness: Catherine Navarro is a 57 year old white female in for a pap and physical.No gyn complaints.Had colonoscopy 2012 with pre cancerous polyp.   Current Medications, Allergies, Past Medical History, Past Surgical History, Family History and Social History were reviewed in [employer] record.   Past Medical History  Diagnosis Date  . Hiatal hernia   . Gastritis   . Pancreatitis   . Sinusitis   . IBS (irritable bowel syndrome)   . GERD (gastroesophageal reflux disease)   . Hypothyroidism     with nodules  . Osteoarthritis   . HTN (hypertension)   . Anisocoria     right eye  . Family history of colon cancer   . Abdominal pain 11/27/2010  . Elevated LFTs 11/27/2010  . Fatty infiltration of liver   . Allergy   . Accessory skin tags 01/25/2013   Past Surgical History  Procedure Laterality Date  . Tubal ligation    . Patella reconstruction      pins  . Cholecystectomy    . Anal fistulectomy    . Eye surgery    Current outpatient prescriptions:aspirin 81 MG tablet, Take 81 mg by mouth daily.  , Disp: , Rfl: ;  celecoxib (CELEBREX) 200 MG capsule, Take 1 capsule (200 mg total) by mouth 2 (two) times daily., Disp: 180 capsule, Rfl: 4;  Cholecalciferol (VITAMIN D) 2000 UNITS CAPS, Take by mouth.  , Disp: , Rfl: ;  fish oil-omega-3 fatty acids 1000 MG capsule, Take 1 g by mouth every other day., Disp: , Rfl:  fluticasone (FLONASE) 50 MCG/ACT nasal spray, Place 2 sprays into the nose daily., Disp: 16 g, Rfl: 1;  Garlic 400 MG TABS, Take by mouth.  , Disp: , Rfl: ;  loratadine (CLARITIN REDITABS) 10 MG dissolvable tablet, Take 10 mg by mouth daily.  , Disp: , Rfl: ;  losartan-hydrochlorothiazide (HYZAAR) 100-25 MG per tablet, Patient needs appt for further refills., Disp: 30 tablet, Rfl: 0 Multiple Vitamin (MULTIVITAMIN PO), Take by mouth 1 day or 1 dose.  , Disp: , Rfl: ;   pantoprazole (PROTONIX) 40 MG tablet, Take 1 tablet (40 mg total) by mouth daily., Disp: 90 tablet, Rfl: 5;  potassium chloride SA (K-DUR,KLOR-CON) 20 MEQ tablet, Patient needs appt for further refills, Disp: 30 tablet, Rfl: 0;  propranolol ER (INDERAL LA) 80 MG 24 hr capsule, Patient needs appt for further refills, Disp: 30 capsule, Rfl: 0 valACYclovir (VALTREX) 1000 MG tablet, Take 2 tablets AS SOON as first symptoms of fever blister appear.  Take 2 tablets 12 hours later., Disp: 30 tablet, Rfl: 0;  vitamin B-12 (CYANOCOBALAMIN) 1000 MCG tablet, Take 1,000 mcg by mouth daily., Disp: , Rfl: ;  [DISCONTINUED] propranolol (INNOPRAN XL) 80 MG 24 hr capsule, Take 1 capsule (80 mg total) by mouth at bedtime., Disp: 90 capsule, Rfl: 3  Review of Systems: Patient denies any headaches, blurred vision, shortness of breath, chest pain, abdominal pain, problems with bowel movements, urination, or intercourse, not having sex,no joint pains or mood swings.    Physical Exam:BP 160/76  Pulse 78  Ht 5\' 5"  (1.651 m)  Wt 257 lb (116.574 kg)  BMI 42.77 kg/m2 General:  Well developed, well nourished, no acute distress Skin:  Warm and dry, has increased number of skin tags, about 10 right inner thigh, about 12 left inner thigh and about 11 left under arm,  has had under arms cryo'd in past Neck:  Midline trachea, enlarged thyroid Lungs; Clear to auscultation bilaterally Breast:  No dominant palpable mass, retraction, or nipple discharge Cardiovascular: Regular rate and rhythm Abdomen:  Soft, non tender, no hepatosplenomegaly.obese,has RUQ discomfort at times(sees Dr Jarold Motto) Pelvic:  External genitalia is normal in appearance.  The vagina is normal in appearance for age.               The cervix is bulbous, pap with HPV performed.  Uterus is felt to be normal size, shape, and contour.  No adnexal masses or tenderness noted. Rectal: Good sphincter tone, no polyps, or hemorrhoids felt.  Hemoccult negative, has mild  rectocele Extremities:  No swelling or varicosities noted Psych:  No mood changes,alert and cooperative,seems happy   Impression: Yearly gyn exam  Skin tags    Plan: Physical in 1 year Mammogram yearly  Colonoscopy 2017  Labs with PCP Call when wants skin tags removed

## 2013-04-13 ENCOUNTER — Telehealth: Payer: Self-pay | Admitting: Gastroenterology

## 2013-04-13 NOTE — Telephone Encounter (Signed)
Spoke with patient and she does not have an MD she prefers. She is having reflux off and on. She will increase her Pantoprazole to BID. Scheduled with Alonza Bogus, PA on 04/17/13 at 10:30 am.

## 2013-04-14 ENCOUNTER — Ambulatory Visit: Payer: 59

## 2013-04-14 ENCOUNTER — Ambulatory Visit (INDEPENDENT_AMBULATORY_CARE_PROVIDER_SITE_OTHER): Payer: 59 | Admitting: Family Medicine

## 2013-04-14 ENCOUNTER — Encounter: Payer: Self-pay | Admitting: *Deleted

## 2013-04-14 VITALS — BP 134/65 | HR 69 | Temp 98.2°F | Resp 16 | Ht 65.5 in | Wt 256.0 lb

## 2013-04-14 DIAGNOSIS — R109 Unspecified abdominal pain: Secondary | ICD-10-CM

## 2013-04-14 DIAGNOSIS — R638 Other symptoms and signs concerning food and fluid intake: Secondary | ICD-10-CM

## 2013-04-14 DIAGNOSIS — G8929 Other chronic pain: Secondary | ICD-10-CM

## 2013-04-14 DIAGNOSIS — IMO0002 Reserved for concepts with insufficient information to code with codable children: Secondary | ICD-10-CM

## 2013-04-14 DIAGNOSIS — I10 Essential (primary) hypertension: Secondary | ICD-10-CM

## 2013-04-14 DIAGNOSIS — M5134 Other intervertebral disc degeneration, thoracic region: Secondary | ICD-10-CM

## 2013-04-14 DIAGNOSIS — IMO0001 Reserved for inherently not codable concepts without codable children: Secondary | ICD-10-CM

## 2013-04-14 LAB — POCT URINALYSIS DIPSTICK
Bilirubin, UA: NEGATIVE
Glucose, UA: NEGATIVE
KETONES UA: NEGATIVE
Leukocytes, UA: NEGATIVE
Nitrite, UA: NEGATIVE
Protein, UA: NEGATIVE
RBC UA: NEGATIVE
Spec Grav, UA: 1.025
UROBILINOGEN UA: 0.2
pH, UA: 5.5

## 2013-04-14 LAB — POCT GLYCOSYLATED HEMOGLOBIN (HGB A1C): Hemoglobin A1C: 5.5

## 2013-04-14 MED ORDER — POTASSIUM CHLORIDE CRYS ER 20 MEQ PO TBCR: 20.0000 meq | EXTENDED_RELEASE_TABLET | Freq: Every day | ORAL | Status: DC

## 2013-04-14 MED ORDER — PROPRANOLOL HCL ER 80 MG PO CP24: 80.0000 mg | ORAL_CAPSULE | Freq: Every day | ORAL | Status: DC

## 2013-04-14 MED ORDER — LOSARTAN POTASSIUM-HCTZ 100-25 MG PO TABS: 1.0000 | ORAL_TABLET | Freq: Every day | ORAL | Status: DC

## 2013-04-14 NOTE — Patient Instructions (Signed)

## 2013-04-14 NOTE — Progress Notes (Signed)
S:  This pt is here for HTN medication refills; she is compliant w/ meds w/o adverse effects.  No report of CP or tightness, palpitations, SOB or DOE, cough, edema, HA, lightheadedness or syncope.  Pt c/o chronic intermittent L flank pain , similar to muscle spasms but feels like it is deep inside. Can feel the discomfort sometimes while driving. No change with breathing or cough, change in position (upright vs. Reclining). No change in stools; has appt w/ GI specialsit in 3 days.  Pt c/o chronic dry mouth and thirst. No hx of diabetes, polyuria or polyphagia, vision disturbances, diaphoresis, HA, dizziness or weakness. No significant weight change.   Patient Active Problem List   Diagnosis Date Noted  . Accessory skin tags 01/25/2013  . 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 Family history of malignant neoplasm of gastrointestinal tract 12/22/2010  . Diverticulosis of colon (without mention of hemorrhage) 12/22/2010  . Benign neoplasm of colon 12/22/2010  . Fatty liver 11/27/2010  . Costochondral chest pain 11/27/2010  . GERD (gastroesophageal reflux disease) 11/27/2010  . HELICOBACTER PYLORI GASTRITIS, HX OF 05/02/2009  . ABDOMINAL PAIN-RUQ 07/12/2007  . THYROID DISORDER 03/23/2007  . HYPERTENSION 03/23/2007  . SINUSITIS 03/23/2007  . IBS 03/23/2007  . OSTEOARTHRITIS 03/23/2007  . LIVER FUNCTION TESTS, ABNORMAL, HX OF 03/23/2007  . PANCREATITIS, ACUTE, HX OF 03/23/2007  . GASTRITIS 12/21/2005  . HIATAL HERNIA 12/21/2005   Prior to Admission medications   Medication Sig Start Date End Date Taking? Authorizing Provider  aspirin 81 MG tablet Take 81 mg by mouth daily.     Yes Historical Provider, MD  celecoxib (CELEBREX) 200 MG capsule Take 1 capsule (200 mg total) by mouth 2 (two) times daily. 05/26/12  Yes Sable Feil, MD  Cholecalciferol (VITAMIN D) 2000 UNITS CAPS Take by mouth.     Yes Historical Provider, MD  fish oil-omega-3 fatty acids 1000 MG capsule Take 1 g by mouth every other day.    Yes Historical Provider, MD  fluticasone (FLONASE) 50 MCG/ACT nasal spray Place 2 sprays into the nose daily. 07/26/12  Yes Chelle S Jeffery, PA-C  Garlic 433 MG TABS Take by mouth.     Yes Historical Provider, MD  loratadine (CLARITIN REDITABS) 10 MG dissolvable tablet Take 10 mg by mouth daily.     Yes Historical Provider, MD  losartan-hydrochlorothiazide (HYZAAR) 100-25 MG per tablet Take 1 tablet by mouth daily.   Yes Barton Fanny, MD  Multiple Vitamin (MULTIVITAMIN PO) Take by mouth 1 day or 1 dose.     Yes Historical Provider, MD  potassium chloride SA (K-DUR,KLOR-CON) 20 MEQ tablet Take 1 tablet (20 mEq total) by mouth daily.   Yes Barton Fanny, MD  propranolol ER (INDERAL LA) 80 MG 24 hr capsule Take 1 capsule (80 mg total) by mouth daily.   Yes Barton Fanny, MD  valACYclovir (VALTREX) 1000 MG tablet Take 2 tablets AS SOON as first symptoms of fever blister appear.  Take 2 tablets 12 hours later. 07/26/12  Yes Chelle S Jeffery, PA-C  vitamin B-12 (CYANOCOBALAMIN) 1000 MCG tablet Take 1,000 mcg by mouth daily.   Yes Historical Provider, MD  Calcium Carbonate-Vitamin D (CALCIUM-D) 600-400 MG-UNIT TABS Take by mouth 2 (two) times daily.    Historical Provider, MD  pantoprazole (PROTONIX) 40 MG tablet Take 1 tablet (40 mg total) by mouth 2 (two) times daily. 04/17/13   Janett Billow D. Zehr, PA-C   PMHx, Surg Hx, Soc and Fam Hx reviewed.  ROS; As  per HPI.  O: Filed Vitals:   04/14/13 1452  BP: 134/65  Pulse: 69  Temp: 98.2 F (36.8 C)  Resp: 16   GEN: In NAD; WN,WD. HENT: [place]/AT; EOMI w/ clear conj/sclerae. Otherwise unremarkable. NECK: Supple w/o LAN or TMG. COR: RRR. LUNGS: CTA; no wheezes or rhonchi. Unlabored resp. BACK: No CVAT; paravertebral muscle spasms and kyphosis noted. Flexibility is good. ABD: Increased girth; normal BS. Soft w/ minimal tenderness, no guarding, masses or HSM. NEURO: A&O x 3; CNs intact. Normal gait. Nonfocal.  Results for orders placed  in visit on 04/14/13  POCT GLYCOSYLATED HEMOGLOBIN (HGB A1C)      Result Value Ref Range   Hemoglobin A1C 5.5    POCT URINALYSIS DIPSTICK      Result Value Ref Range   Color, UA yellow     Clarity, UA clear     Glucose, UA neg     Bilirubin, UA neg     Ketones, UA neg     Spec Grav, UA 1.025     Blood, UA neg     pH, UA 5.5     Protein, UA neg     Urobilinogen, UA 0.2     Nitrite, UA neg     Leukocytes, UA Negative      [facility] reading (PRIMARY) by  Dr. Leward Quan:  CXR- Lung fields clear w/o evidence of acute process. Heart size normal. Spine- kyphosis and degenerative disc changes w/ lipping. No evidence of fracture or dislocation.  A/P: HYPERTENSION- Stable on current medications.  Thirst - Plan: POCT glycosylated hemoglobin (Hb A1C), POCT urinalysis dipstick  Left flank pain, chronic - Suspect related to T-spine DDD. Plan: POCT urinalysis dipstick, DG Chest 2 View  DDD (degenerative disc disease), thoracic  Meds ordered this encounter  Medications  . losartan-hydrochlorothiazide (HYZAAR) 100-25 MG per tablet    Sig: Take 1 tablet by mouth daily.    Dispense:  90 tablet    Refill:  1  . propranolol ER (INDERAL LA) 80 MG 24 hr capsule    Sig: Take 1 capsule (80 mg total) by mouth daily.    Dispense:  90 capsule    Refill:  1  . potassium chloride SA (K-DUR,KLOR-CON) 20 MEQ tablet    Sig: Take 1 tablet (20 mEq total) by mouth daily.    Dispense:  90 tablet    Refill:  1

## 2013-04-17 ENCOUNTER — Encounter: Payer: Self-pay | Admitting: Gastroenterology

## 2013-04-17 ENCOUNTER — Ambulatory Visit (INDEPENDENT_AMBULATORY_CARE_PROVIDER_SITE_OTHER): Payer: 59 | Admitting: Gastroenterology

## 2013-04-17 VITALS — BP 110/70 | HR 66 | Ht 66.0 in | Wt 256.8 lb

## 2013-04-17 DIAGNOSIS — K219 Gastro-esophageal reflux disease without esophagitis: Secondary | ICD-10-CM

## 2013-04-17 MED ORDER — PANTOPRAZOLE SODIUM 40 MG PO TBEC: 40.0000 mg | DELAYED_RELEASE_TABLET | Freq: Two times a day (BID) | ORAL | Status: DC

## 2013-04-17 NOTE — Progress Notes (Addendum)
04/17/2013 Catherine Navarro [id number] [birth year]   History of Present Illness:  This is a pleasant 58 year old female who was a previous patient of Dr. Buel Ream. She has been treated for acid reflux and most recently was on pantoprazole 40 mg daily. She had been on Nexium in the pas,t however, her PPI was changed to pantoprazole some time ago due to issues of insurance coverage for the Nexium. She states that initially the pantoprazole was working well, however, recently it seems that it has not been nearly as effective. She complains of having acid reflux coming back up after eating and drinking. Also has some very mild epigastric abdominal pain/discomfort. She says that all of his symptoms are typical of her acid reflux in the past when it was not as well-controlled. 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 She denies any complaints of weight loss, dysphasia, or nausea and vomiting.  She called our office last Friday and was instructed to increase the pantoprazole to twice a day. She has been taking it twice a day now for only the past couple of days and says that she has already noticed improvement in her symptoms.  Current Medications, Allergies, Past Medical History, Past Surgical History, Family History and Social History were reviewed in [employer] record.   Physical Exam: BP 110/70  Pulse 66  Ht 5\' 6"  (1.676 m)  Wt 256 lb 12.8 oz (116.484 kg)  BMI 41.47 kg/m2 General: Well developed white female in no acute distress Head: Normocephalic and atraumatic Eyes:  Sclerae anicteric, conjunctiva pink  Ears: Normal auditory acuity Lungs: Clear throughout to auscultation Heart: Regular rate and rhythm Abdomen: Soft, obese, non-distended.  Normal bowel sounds.  Mild RUQ TTP without R/R/G. Musculoskeletal: Symmetrical with no gross deformities  Extremities: No edema   Neurological: Alert oriented x 4, grossly non-focal Psychological:  Alert and cooperative. Normal mood and affect  Assessment and Recommendations: -GERD:  Recently not well-controlled on pantoprazole 40 mg daily, but improved since increasing to BID just three days ago.  Will have her continue on the BID dosing for 4-8 weeks then will try reducing back to once a day.  I also discussed with her about possibly changing the PPI and/or EGD if symptoms fail to improve further.  Will bring her back in 4-8 weeks to reassess but advised her to call back if symptoms significantly worse in the interim.  Addendum: Reviewed and agree with initial management. Jerene Bears, MD

## 2013-04-17 NOTE — Patient Instructions (Signed)
You have a follow up visit with Dr Hilarie Fredrickson on 06-05-2013 at 230 pm  We have sent the following medications to your pharmacy for you to pick up at your convenience: Pantoprazole, please take one tablet by mouth twice daily   Information on acid reflux was given today for your review.

## 2013-04-25 ENCOUNTER — Encounter: Payer: Self-pay | Admitting: Internal Medicine

## 2013-06-05 ENCOUNTER — Ambulatory Visit: Payer: 59 | Admitting: Internal Medicine

## 2013-06-21 ENCOUNTER — Ambulatory Visit: Payer: 59 | Admitting: Internal Medicine

## 2013-08-01 ENCOUNTER — Encounter: Payer: Self-pay | Admitting: Physician Assistant

## 2013-08-01 ENCOUNTER — Ambulatory Visit (INDEPENDENT_AMBULATORY_CARE_PROVIDER_SITE_OTHER): Payer: 59 | Admitting: Physician Assistant

## 2013-08-01 VITALS — BP 127/72 | HR 56 | Temp 98.3°F | Resp 16 | Ht 65.0 in | Wt 251.0 lb

## 2013-08-01 DIAGNOSIS — E079 Disorder of thyroid, unspecified: Secondary | ICD-10-CM

## 2013-08-01 DIAGNOSIS — Z0000 Encounter for general adult medical examination without abnormal findings: Secondary | ICD-10-CM

## 2013-08-01 DIAGNOSIS — I10 Essential (primary) hypertension: Secondary | ICD-10-CM

## 2013-08-01 DIAGNOSIS — R739 Hyperglycemia, unspecified: Secondary | ICD-10-CM

## 2013-08-01 DIAGNOSIS — Z6836 Body mass index (BMI) 36.0-36.9, adult: Secondary | ICD-10-CM | POA: Insufficient documentation

## 2013-08-01 DIAGNOSIS — R7309 Other abnormal glucose: Secondary | ICD-10-CM

## 2013-08-01 LAB — COMPLETE METABOLIC PANEL WITH GFR
ALBUMIN: 4.4 g/dL (ref 3.5–5.2)
ALK PHOS: 80 U/L (ref 39–117)
ALT: 43 U/L — AB (ref 0–35)
AST: 33 U/L (ref 0–37)
BUN: 17 mg/dL (ref 6–23)
CO2: 28 mEq/L (ref 19–32)
CREATININE: 0.88 mg/dL (ref 0.50–1.10)
Calcium: 9.7 mg/dL (ref 8.4–10.5)
Chloride: 105 mEq/L (ref 96–112)
GFR, Est African American: 84 mL/min
GFR, Est Non African American: 73 mL/min
Glucose, Bld: 100 mg/dL — ABNORMAL HIGH (ref 70–99)
POTASSIUM: 4.2 meq/L (ref 3.5–5.3)
Sodium: 142 mEq/L (ref 135–145)
Total Bilirubin: 0.5 mg/dL (ref 0.2–1.2)
Total Protein: 7.3 g/dL (ref 6.0–8.3)

## 2013-08-01 LAB — CBC WITH DIFFERENTIAL/PLATELET
Basophils Absolute: 0.1 10*3/uL (ref 0.0–0.1)
Basophils Relative: 1 % (ref 0–1)
EOS ABS: 0.1 10*3/uL (ref 0.0–0.7)
EOS PCT: 2 % (ref 0–5)
HEMATOCRIT: 39 % (ref 36.0–46.0)
HEMOGLOBIN: 13.7 g/dL (ref 12.0–15.0)
LYMPHS ABS: 2 10*3/uL (ref 0.7–4.0)
LYMPHS PCT: 37 % (ref 12–46)
MCH: 29.7 pg (ref 26.0–34.0)
MCHC: 35.1 g/dL (ref 30.0–36.0)
MCV: 84.6 fL (ref 78.0–100.0)
MONO ABS: 0.5 10*3/uL (ref 0.1–1.0)
MONOS PCT: 9 % (ref 3–12)
Neutro Abs: 2.7 10*3/uL (ref 1.7–7.7)
Neutrophils Relative %: 51 % (ref 43–77)
PLATELETS: 210 10*3/uL (ref 150–400)
RBC: 4.61 MIL/uL (ref 3.87–5.11)
RDW: 13.4 % (ref 11.5–15.5)
WBC: 5.3 10*3/uL (ref 4.0–10.5)

## 2013-08-01 LAB — LIPID PANEL
CHOL/HDL RATIO: 2.9 ratio
Cholesterol: 140 mg/dL (ref 0–200)
HDL: 49 mg/dL (ref >39)
LDL CALC: 69 mg/dL (ref 0–99)
Triglycerides: 110 mg/dL (ref <150)
VLDL: 22 mg/dL (ref 0–40)

## 2013-08-01 LAB — GLUCOSE, POCT (MANUAL RESULT ENTRY): POC Glucose: 115 mg/dl — AB (ref 70–99)

## 2013-08-01 LAB — TSH: TSH: 1.105 u[IU]/mL (ref 0.350–4.500)

## 2013-08-01 LAB — POCT GLYCOSYLATED HEMOGLOBIN (HGB A1C): HEMOGLOBIN A1C: 5.5

## 2013-08-01 NOTE — Progress Notes (Signed)
   Subjective:    Patient ID: Catherine Navarro, female    DOB: 1956-01-24, 58 y.o.   MRN: [id number]  HPI    Review of Systems  Musculoskeletal: Positive for arthralgias and back pain.  Allergic/Immunologic: Positive for environmental allergies.       Objective:   Physical Exam        Assessment & Plan:

## 2013-08-01 NOTE — Patient Instructions (Signed)
I will contact you with your lab results as soon as they are available.   If you have not heard from me in 2 weeks, please contact me.  The fastest way to get your results is to register for My Chart (see the instructions on the last page of this printout).  Keeping You Healthy  Get These Tests  Blood Pressure- Have your blood pressure checked by your healthcare provider at least once a year.  Normal blood pressure is 120/80.  Weight- Have your body mass index (BMI) calculated to screen for obesity.  BMI is a measure of body fat based on height and weight.  You can calculate your own BMI at www.nhlbisupport.com/bmi/  Cholesterol- Have your cholesterol checked every year.  Diabetes- Have your blood sugar checked every year if you have high blood pressure, high cholesterol, a family history of diabetes or if you are overweight.  Pap Smear- Have a pap smear every 1 to 5 years if you have been sexually active.  If you are older than 65 and recent pap smears have been normal you may not need additional pap smears.  In addition, if you have had a hysterectomy  For benign disease additional pap smears are not necessary.  Mammogram-Yearly mammograms are essential for early detection of breast cancer  Screening for Colon Cancer- Colonoscopy starting at age 50. Screening may begin sooner depending on your family history and other health conditions.  Follow up colonoscopy as directed by your Gastroenterologist.  Screening for Osteoporosis- Screening begins at age 65 with bone density scanning, sooner if you are at higher risk for developing Osteoporosis.  Get these medicines  Calcium with Vitamin D- Your body requires 1200-1500 mg of Calcium a day and 800-1000 IU of Vitamin D a day.  You can only absorb 500 mg of Calcium at a time therefore Calcium must be taken in 2 or 3 separate doses throughout the day.  Hormones- Hormone therapy has been associated with increased risk for certain cancers and  heart disease.  Talk to your healthcare provider about if you need relief from menopausal symptoms.  Aspirin- Ask your healthcare provider about taking Aspirin to prevent Heart Disease and Stroke.  Get these Immuniztions  Flu shot- Every fall  Pneumonia shot- Once after the age of 65; if you are younger ask your healthcare provider if you need a pneumonia shot.  Tetanus- Every ten years.  Zostavax- Once after the age of 60 to prevent shingles.  Take these steps  Don't smoke- Your healthcare provider can help you quit. For tips on how to quit, ask your healthcare provider or go to www.smokefree.gov or call 1-800 QUIT-NOW.  Be physically active- Exercise 5 days a week for a minimum of 30 minutes.  If you are not already physically active, start slow and gradually work up to 30 minutes of moderate physical activity.  Try walking, dancing, bike riding, swimming, etc.  Eat a healthy diet- Eat a variety of healthy foods such as fruits, vegetables, whole grains, low fat milk, low fat cheeses, yogurt, lean meats, chicken, fish, eggs, dried beans, tofu, etc.  For more information go to www.thenutritionsource.org  Dental visit- Brush and floss teeth twice daily; visit your dentist twice a year.  Eye exam- Visit your Optometrist or Ophthalmologist yearly.  Drink alcohol in moderation- Limit alcohol intake to one drink or less a day.  Never drink and drive.  Depression- Your emotional health is as important as your physical health.  If you're feeling   down or losing interest in things you normally enjoy, please talk to your healthcare provider.  Seat Belts- can save your life; always wear one  Smoke/Carbon Monoxide detectors- These detectors need to be installed on the appropriate level of your home.  Replace batteries at least once a year.  Violence- If anyone is threatening or hurting you, please tell your healthcare provider.  Living Will/ Health care power of attorney- Discuss with your  healthcare provider and family. 

## 2013-08-01 NOTE — Progress Notes (Signed)
Subjective:    Patient ID: Catherine Navarro, female    DOB: 05-21-1955, 58 y.o.   MRN: [id number]   PCP: Ura Hausen, PA-C  Chief Complaint  Patient presents with  . Annual Exam      Active Ambulatory Problems    Diagnosis Date Noted  . THYROID DISORDER 03/23/2007  . HYPERTENSION 03/23/2007  . SINUSITIS 03/23/2007  . GASTRITIS 12/21/2005  . HIATAL HERNIA 12/21/2005  . IBS 03/23/2007  . OSTEOARTHRITIS 03/23/2007  . ABDOMINAL PAIN-RUQ 07/12/2007  . LIVER FUNCTION TESTS, ABNORMAL, HX OF 03/23/2007  . PANCREATITIS, ACUTE, HX OF 03/23/2007  . HELICOBACTER PYLORI GASTRITIS, HX OF 05/02/2009  . Fatty liver 11/27/2010  . Costochondral chest pain 11/27/2010  . GERD (gastroesophageal reflux disease) 11/27/2010  . Family history of malignant neoplasm of gastrointestinal tract 12/22/2010  . Diverticulosis of colon (without mention of hemorrhage) 12/22/2010  . Benign neoplasm of colon 12/22/2010  . 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 Accessory skin tags 01/25/2013   Resolved Ambulatory Problems    Diagnosis Date Noted  . No Resolved Ambulatory Problems   Past Medical History  Diagnosis Date  . Hiatal hernia   . Gastritis   . Pancreatitis   . Sinusitis   . IBS (irritable bowel syndrome)   . Hypothyroidism   . Osteoarthritis   . HTN (hypertension)   . Family history of colon cancer   . Abdominal pain 11/27/2010  . Elevated LFTs 11/27/2010  . Fatty infiltration of liver   . Allergy   . Colon polyps 12/22/2010    Past Surgical History  Procedure Laterality Date  . Tubal ligation    . Patella reconstruction      pins  . Cholecystectomy    . Anal fistulectomy    . Eye surgery    . Colonoscopy w/ biopsies    . Esophagogastroduodenoscopy      Allergies  Allergen Reactions  . Amoxicillin     rash  . Codeine     REACTION: headache    Prior to Admission medications   Medication Sig Start Date End Date Taking? Authorizing Provider  aspirin 81 MG tablet Take 81 mg by  mouth daily.     Yes Historical Provider, MD  Calcium Carbonate-Vitamin D (CALCIUM-D) 600-400 MG-UNIT TABS Take by mouth 2 (two) times daily.   Yes Historical Provider, MD  celecoxib (CELEBREX) 200 MG capsule Take 1 capsule (200 mg total) by mouth 2 (two) times daily. 05/26/12  Yes Sable Feil, MD  Cholecalciferol (VITAMIN D) 2000 UNITS CAPS Take by mouth.     Yes Historical Provider, MD  fish oil-omega-3 fatty acids 1000 MG capsule Take 1 g by mouth every other day.   Yes Historical Provider, MD  fluticasone (FLONASE) 50 MCG/ACT nasal spray Place 2 sprays into the nose daily. 07/26/12  Yes Equan Cogbill S Wilmary Levit, PA-C  Garlic 938 MG TABS Take by mouth.     Yes Historical Provider, MD  loratadine (CLARITIN REDITABS) 10 MG dissolvable tablet Take 10 mg by mouth daily.     Yes Historical Provider, MD  losartan-hydrochlorothiazide (HYZAAR) 100-25 MG per tablet Take 1 tablet by mouth daily. 04/14/13  Yes Barton Fanny, MD  Multiple Vitamin (MULTIVITAMIN PO) Take by mouth 1 day or 1 dose.     Yes Historical Provider, MD  pantoprazole (PROTONIX) 40 MG tablet Take 1 tablet (40 mg total) by mouth 2 (two) times daily. 04/17/13  Yes Jessica D. Zehr, PA-C  potassium chloride SA (K-DUR,KLOR-CON) 20 MEQ tablet Take  1 tablet (20 mEq total) by mouth daily. 04/14/13  Yes Barton Fanny, MD  propranolol ER (INDERAL LA) 80 MG 24 hr capsule Take 1 capsule (80 mg total) by mouth daily. 04/14/13  Yes Barton Fanny, MD  valACYclovir (VALTREX) 1000 MG tablet Take 2 tablets AS SOON as first symptoms of fever blister appear.  Take 2 tablets 12 hours later. 07/26/12  Yes Chloe Bluett S Audris Speaker, PA-C  vitamin B-12 (CYANOCOBALAMIN) 1000 MCG tablet Take 1,000 mcg by mouth daily.   Yes Historical Provider, MD    History   Social History  . Marital Status: Divorced    Spouse Name: BF: Laddie Aquas    Number of Children: 3  . Years of Education: 12+   Occupational History  . IT Support Other   Social History Main Topics    . Smoking status: Former Smoker    Types: Cigarettes    Quit date: 01/26/1998  . Smokeless tobacco: Never Used  . Alcohol Use: No  . Drug Use: No  . Sexual Activity: No   Other Topics Concern  . None   Social History Narrative   Lives with her boyfriend, Laddie Aquas.    family history includes Alzheimer's disease in her maternal aunt and paternal grandmother; Breast cancer in her maternal aunt; COPD in her father; Cancer in her sister; Cancer (age of onset: 24) in her brother; Diabetes in her mother; Heart attack in her paternal grandfather; Irritable bowel syndrome in her mother; Stroke in her mother. indicated that her mother is deceased. She indicated that her father is deceased. She indicated that all of her three sisters are alive. She indicated that all of her three brothers are alive. She indicated that her maternal grandmother is deceased. She indicated that her maternal grandfather is deceased. She indicated that her paternal grandmother is deceased. She indicated that her paternal grandfather is deceased. She indicated that all of her three sons are alive. She indicated that both of her maternal aunts are deceased.   HPI  Sees GYN for breast and pelvic exams. Is working on healthier eating and is starting a new exercise program. Reading Dr. Corky Sox Perfect 10 Diet. No snacking, no bread for 3 weeks, then adds in whole grains gradually.  Review of Systems     Objective:   Physical Exam  Vitals reviewed. Constitutional: She is oriented to person, place, and time. Vital signs are normal. She appears well-developed and well-nourished. She is active and cooperative. No distress.  HENT:  Head: Normocephalic and atraumatic.  Right Ear: Hearing, tympanic membrane, external ear and ear canal normal. No foreign bodies.  Left Ear: Hearing, tympanic membrane, external ear and ear canal normal. No foreign bodies.  Nose: Nose normal.  Mouth/Throat: Uvula is midline, oropharynx is clear  and moist and mucous membranes are normal. No oral lesions. Normal dentition. No dental abscesses or uvula swelling. No oropharyngeal exudate.  Upper partial  Eyes: Conjunctivae, EOM and lids are normal. Pupils are equal, round, and reactive to light. Right eye exhibits no discharge. Left eye exhibits no discharge. No scleral icterus.  Fundoscopic exam:      The right eye shows no arteriolar narrowing, no AV nicking, no exudate, no hemorrhage and no papilledema. The right eye shows red reflex.       The left eye shows no arteriolar narrowing, no AV nicking, no exudate, no hemorrhage and no papilledema. The left eye shows red reflex.  RIGHT anisocoria  Neck: Trachea normal, normal range of  motion and full passive range of motion without pain. Neck supple. No spinous process tenderness and no muscular tenderness present. No mass and no thyromegaly present.  Cardiovascular: Normal rate, regular rhythm, normal heart sounds, intact distal pulses and normal pulses.   Pulmonary/Chest: Effort normal and breath sounds normal.  Musculoskeletal: She exhibits no edema and no tenderness.       Cervical back: Normal.       Thoracic back: Normal.       Lumbar back: Normal.  Lymphadenopathy:       Head (right side): No tonsillar, no preauricular, no posterior auricular and no occipital adenopathy present.       Head (left side): No tonsillar, no preauricular, no posterior auricular and no occipital adenopathy present.    She has no cervical adenopathy.       Right: No supraclavicular adenopathy present.       Left: No supraclavicular adenopathy present.  Neurological: She is alert and oriented to person, place, and time. She has normal strength and normal reflexes. No cranial nerve deficit. She exhibits normal muscle tone. Coordination and gait normal.  Skin: Skin is warm, dry and intact. No rash noted. She is not diaphoretic. No cyanosis or erythema. Nails show no clubbing.  Psychiatric: She has a normal mood  and affect. Her speech is normal and behavior is normal. Judgment and thought content normal.     Results for orders placed in visit on 08/01/13  GLUCOSE, POCT (MANUAL RESULT ENTRY)      Result Value Ref Range   POC Glucose 115 (*) 70 - 99 mg/dl  POCT GLYCOSYLATED HEMOGLOBIN (HGB A1C)      Result Value Ref Range   Hemoglobin A1C 5.5          Assessment & Plan:  1. Unspecified essential hypertension Controlled. Continue current treatment. - CBC with Differential - COMPLETE METABOLIC PANEL WITH GFR  2. Routine general medical examination at a health care facility Age appropriate anticipatory guidance provided. - CBC with Differential - Lipid panel - COMPLETE METABOLIC PANEL WITH GFR  3. Hyperglycemia Elevated glucose, A1C remains below 6%. Healthy eating and increased exercise is critical.  Does not need medication at present. - POCT glucose (manual entry) - POCT glycosylated hemoglobin (Hb A1C)  4. THYROID DISORDER Has remained euthyroid off medication. - TSH   Fara Chute, PA-C Physician Assistant-Certified [facility]

## 2013-08-21 ENCOUNTER — Other Ambulatory Visit: Payer: Self-pay | Admitting: Family Medicine

## 2013-09-06 ENCOUNTER — Other Ambulatory Visit: Payer: Self-pay | Admitting: Family Medicine

## 2013-09-08 ENCOUNTER — Other Ambulatory Visit: Payer: Self-pay

## 2013-09-08 MED ORDER — POTASSIUM CHLORIDE CRYS ER 20 MEQ PO TBCR: EXTENDED_RELEASE_TABLET | ORAL | Status: DC

## 2013-09-19 ENCOUNTER — Encounter: Payer: Self-pay | Admitting: Gastroenterology

## 2013-10-26 ENCOUNTER — Other Ambulatory Visit: Payer: Self-pay

## 2013-10-26 MED ORDER — PROPRANOLOL HCL ER 80 MG PO CP24: ORAL_CAPSULE | ORAL | Status: DC

## 2013-11-10 ENCOUNTER — Other Ambulatory Visit: Payer: Self-pay

## 2013-11-20 ENCOUNTER — Other Ambulatory Visit: Payer: Self-pay | Admitting: Family Medicine

## 2013-11-27 ENCOUNTER — Encounter: Payer: Self-pay | Admitting: Physician Assistant

## 2014-01-01 ENCOUNTER — Other Ambulatory Visit: Payer: Self-pay

## 2014-01-01 DIAGNOSIS — Z1231 Encounter for screening mammogram for malignant neoplasm of breast: Secondary | ICD-10-CM

## 2014-01-12 ENCOUNTER — Other Ambulatory Visit: Payer: Self-pay | Admitting: Family Medicine

## 2014-01-12 ENCOUNTER — Other Ambulatory Visit: Payer: Self-pay | Admitting: Physician Assistant

## 2014-01-18 ENCOUNTER — Other Ambulatory Visit: Payer: Self-pay

## 2014-01-18 MED ORDER — LOSARTAN POTASSIUM-HCTZ 100-25 MG PO TABS: ORAL_TABLET | ORAL | Status: DC

## 2014-01-18 MED ORDER — PROPRANOLOL HCL ER 80 MG PO CP24: ORAL_CAPSULE | ORAL | Status: DC

## 2014-01-25 ENCOUNTER — Ambulatory Visit
Admission: RE | Admit: 2014-01-25 | Discharge: 2014-01-25 | Disposition: A | Discharge status: Home / Self Care | Payer: 59 | Source: Ambulatory Visit

## 2014-01-25 DIAGNOSIS — Z1231 Encounter for screening mammogram for malignant neoplasm of breast: Secondary | ICD-10-CM

## 2014-01-30 ENCOUNTER — Encounter: Payer: Self-pay | Admitting: Physician Assistant

## 2014-01-30 ENCOUNTER — Ambulatory Visit (INDEPENDENT_AMBULATORY_CARE_PROVIDER_SITE_OTHER): Payer: 59 | Admitting: Physician Assistant

## 2014-01-30 VITALS — BP 134/76 | HR 60 | Temp 98.2°F | Resp 16 | Ht 66.0 in | Wt 220.0 lb

## 2014-01-30 DIAGNOSIS — K219 Gastro-esophageal reflux disease without esophagitis: Secondary | ICD-10-CM

## 2014-01-30 DIAGNOSIS — R109 Unspecified abdominal pain: Secondary | ICD-10-CM

## 2014-01-30 DIAGNOSIS — R739 Hyperglycemia, unspecified: Secondary | ICD-10-CM

## 2014-01-30 DIAGNOSIS — R1012 Left upper quadrant pain: Secondary | ICD-10-CM

## 2014-01-30 DIAGNOSIS — I10 Essential (primary) hypertension: Secondary | ICD-10-CM

## 2014-01-30 DIAGNOSIS — G8929 Other chronic pain: Secondary | ICD-10-CM

## 2014-01-30 DIAGNOSIS — M5134 Other intervertebral disc degeneration, thoracic region: Secondary | ICD-10-CM

## 2014-01-30 LAB — COMPREHENSIVE METABOLIC PANEL
ALK PHOS: 85 U/L (ref 39–117)
ALT: 16 U/L (ref 0–35)
AST: 20 U/L (ref 0–37)
Albumin: 4.2 g/dL (ref 3.5–5.2)
BILIRUBIN TOTAL: 0.7 mg/dL (ref 0.2–1.2)
BUN: 14 mg/dL (ref 6–23)
CALCIUM: 9.8 mg/dL (ref 8.4–10.5)
CHLORIDE: 102 meq/L (ref 96–112)
CO2: 28 mEq/L (ref 19–32)
CREATININE: 0.68 mg/dL (ref 0.50–1.10)
Glucose, Bld: 100 mg/dL — ABNORMAL HIGH (ref 70–99)
Potassium: 3.8 mEq/L (ref 3.5–5.3)
Sodium: 140 mEq/L (ref 135–145)
Total Protein: 7 g/dL (ref 6.0–8.3)

## 2014-01-30 LAB — POCT GLYCOSYLATED HEMOGLOBIN (HGB A1C): HEMOGLOBIN A1C: 5.4

## 2014-01-30 LAB — GLUCOSE, POCT (MANUAL RESULT ENTRY): POC Glucose: 112 mg/dl — AB (ref 70–99)

## 2014-01-30 LAB — LIPID PANEL
Cholesterol: 160 mg/dL (ref 0–200)
HDL: 52 mg/dL (ref >39)
LDL CALC: 85 mg/dL (ref 0–99)
TRIGLYCERIDES: 117 mg/dL (ref <150)
Total CHOL/HDL Ratio: 3.1 Ratio
VLDL: 23 mg/dL (ref 0–40)

## 2014-01-30 LAB — TSH: TSH: 0.819 u[IU]/mL (ref 0.350–4.500)

## 2014-01-30 MED ORDER — POTASSIUM CHLORIDE CRYS ER 20 MEQ PO TBCR: EXTENDED_RELEASE_TABLET | ORAL | Status: DC

## 2014-01-30 MED ORDER — MELOXICAM 15 MG PO TABS: 15.0000 mg | ORAL_TABLET | Freq: Every day | ORAL | Status: DC

## 2014-01-30 MED ORDER — PROPRANOLOL HCL ER 80 MG PO CP24: ORAL_CAPSULE | ORAL | Status: DC

## 2014-01-30 MED ORDER — PANTOPRAZOLE SODIUM 40 MG PO TBEC: 40.0000 mg | DELAYED_RELEASE_TABLET | Freq: Two times a day (BID) | ORAL | Status: DC

## 2014-01-30 MED ORDER — LOSARTAN POTASSIUM-HCTZ 100-25 MG PO TABS: ORAL_TABLET | ORAL | Status: DC

## 2014-01-30 NOTE — Progress Notes (Signed)
Subjective:    Patient ID: Catherine Navarro, female    DOB: [birth year], 59 y.o.   MRN: [id number]  PCP: Catherine Guimond, PA-C  Chief Complaint  Patient presents with  . Follow-up  . Hypertension  . Hyperglycemia  . Medication Refill    All meds see list    Allergies  Allergen Reactions  . Amoxicillin     rash  . Codeine     REACTION: headache    Patient Active Problem List   Diagnosis Date Noted  . Severe obesity (BMI >= 40) 08/01/2013  . Accessory skin tags 01/25/2013  . 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 Family history of malignant neoplasm of gastrointestinal tract 12/22/2010  . Diverticulosis of colon (without mention of hemorrhage) 12/22/2010  . Benign neoplasm of colon 12/22/2010  . Fatty liver 11/27/2010  . Costochondral chest pain 11/27/2010  . GERD (gastroesophageal reflux disease) 11/27/2010  . HELICOBACTER PYLORI GASTRITIS, HX OF 05/02/2009  . ABDOMINAL PAIN-RUQ 07/12/2007  . THYROID DISORDER 03/23/2007  . HYPERTENSION 03/23/2007  . SINUSITIS 03/23/2007  . IBS 03/23/2007  . OSTEOARTHRITIS 03/23/2007  . LIVER FUNCTION TESTS, ABNORMAL, HX OF 03/23/2007  . PANCREATITIS, ACUTE, HX OF 03/23/2007  . GASTRITIS 12/21/2005  . HIATAL HERNIA 12/21/2005    Prior to Admission medications   Medication Sig Start Date End Date Taking? Authorizing Provider  aspirin 81 MG tablet Take 81 mg by mouth daily.     Yes Historical Provider, MD  Calcium Carbonate-Vitamin D (CALCIUM-D) 600-400 MG-UNIT TABS Take by mouth 2 (two) times daily.   Yes Historical Provider, MD  celecoxib (CELEBREX) 200 MG capsule Take 1 capsule (200 mg total) by mouth 2 (two) times daily. 05/26/12  Yes Catherine Feil, MD  Cholecalciferol (VITAMIN D) 2000 UNITS CAPS Take by mouth.     Yes Historical Provider, MD  fish oil-omega-3 fatty acids 1000 MG capsule Take 1 g by mouth every other day.   Yes Historical Provider, MD  fluticasone (FLONASE) 50 MCG/ACT nasal spray Place 2 sprays into the nose daily. 07/26/12   Yes Catherine Navarro S Eran Windish, PA-C  Garlic 650 MG TABS Take by mouth.     Yes Historical Provider, MD  loratadine (CLARITIN REDITABS) 10 MG dissolvable tablet Take 10 mg by mouth daily.     Yes Historical Provider, MD  losartan-hydrochlorothiazide Catherine Navarro) 100-25 MG per tablet Take 1 tablet by mouth daily 01/18/14  Yes Catherine Honour, MD  Multiple Vitamin (MULTIVITAMIN PO) Take by mouth 1 day or 1 dose.     Yes Historical Provider, MD  pantoprazole (PROTONIX) 40 MG tablet Take 1 tablet (40 mg total) by mouth 2 (two) times daily. 04/17/13  Yes Catherine D. Zehr, PA-C  potassium chloride SA (K-DUR,KLOR-CON) 20 MEQ tablet Take 1 tablet by mouth  daily 09/08/13  Yes Catherine Russian, MD  propranolol ER (INDERAL LA) 80 MG 24 hr capsule TAKE 1 CAPSULE BY MOUTH DAILY 01/18/14  Yes Catherine Honour, MD  vitamin B-12 (CYANOCOBALAMIN) 1000 MCG tablet Take 1,000 mcg by mouth daily.   Yes Historical Provider, MD  valACYclovir (VALTREX) 1000 MG tablet Take 2 tablets AS SOON as first symptoms of fever blister appear.  Take 2 tablets 12 hours later. Patient not taking: Reported on 01/30/2014 07/26/12   Catherine Chute, PA-C    Medical, Surgical, Family and Social History reviewed and updated.  HPI  Presents for follow-up of her chronic medical problems and medication refills. She's working hard on healthy lifestyle changes and is pleased with  weight loss thus far. Her sister, Catherine Navarro, died of metastatic colon cancer, followed several days later by her sister in law, also of colon cancer.  Review of Systems No chest pain, SOB, HA, dizziness, vision change, N/V, diarrhea, constipation, dysuria, urinary urgency or frequency, new myalgias, arthralgias or rash.     Objective:   Physical Exam  Constitutional: She is oriented to person, place, and time. She appears well-developed and well-nourished. No distress.  BP 134/76 mmHg  Pulse 60  Temp(Src) 98.2 F (36.8 C) (Oral)  Resp 16  Ht 5\' 6"  (1.676 m)  Wt 220 lb (99.791 kg)   BMI 35.53 kg/m2  SpO2 96% Weight is down 31 lbs since 07/2013  HENT:  Head: Normocephalic and atraumatic.  Eyes: Conjunctivae and lids are normal. No scleral icterus.  Anisocoria of the LEFT eye is unchanged.  Neck: Neck supple. No thyromegaly present.  Cardiovascular: Normal rate, regular rhythm, normal heart sounds and intact distal pulses.   Pulmonary/Chest: Effort normal and breath sounds normal.  Lymphadenopathy:    She has no cervical adenopathy.  Neurological: She is alert and oriented to person, place, and time.  Skin: Skin is warm and dry.  Psychiatric: She has a normal mood and affect. Her speech is normal and behavior is normal. She does not express inappropriate judgment.      Results for orders placed or performed in visit on 01/30/14  POCT glucose (manual entry)  Result Value Ref Range   POC Glucose 112 (A) 70 - 99 mg/dl  POCT glycosylated hemoglobin (Hb A1C)  Result Value Ref Range   Hemoglobin A1C 5.4        Assessment & Plan:  1. Hyperglycemia Fasting glucose remains below 126, and A1C below 6%. - POCT glucose (manual entry) - POCT glycosylated hemoglobin (Hb A1C)  2. DDD (degenerative disc disease), thoracic Stable. - meloxicam (MOBIC) 15 MG tablet; Take 1 tablet (15 mg total) by mouth daily.  Dispense: 30 tablet; Refill: 1  3. Essential hypertension Stable. Controlled. - propranolol ER (INDERAL LA) 80 MG 24 hr capsule; TAKE 1 CAPSULE BY MOUTH DAILY  Dispense: 90 capsule; Refill: 0 - losartan-hydrochlorothiazide (HYZAAR) 100-25 MG per tablet; Take 1 tablet by mouth daily  Dispense: 90 tablet; Refill: 0 - potassium chloride SA (K-DUR,KLOR-CON) 20 MEQ tablet; Take 1 tablet by mouth  daily  Dispense: 90 tablet; Refill: 2 - Comprehensive metabolic panel - Lipid panel - TSH  4. Left flank pain, chronic Stable. - meloxicam (MOBIC) 15 MG tablet; Take 1 tablet (15 mg total) by mouth daily.  Dispense: 30 tablet; Refill: 1  5. Gastroesophageal reflux  disease, esophagitis presence not specified Stable. - pantoprazole (PROTONIX) 40 MG tablet; Take 1 tablet (40 mg total) by mouth 2 (two) times daily.  Dispense: 60 tablet; Refill: 5   Catherine Chute, PA-C Physician Assistant-Certified [facility]

## 2014-01-30 NOTE — Patient Instructions (Signed)
Keep up the GREAT work!

## 2014-02-06 ENCOUNTER — Other Ambulatory Visit: Payer: Self-pay

## 2014-02-06 DIAGNOSIS — R109 Unspecified abdominal pain: Secondary | ICD-10-CM

## 2014-02-06 DIAGNOSIS — K219 Gastro-esophageal reflux disease without esophagitis: Secondary | ICD-10-CM

## 2014-02-06 DIAGNOSIS — M5134 Other intervertebral disc degeneration, thoracic region: Secondary | ICD-10-CM

## 2014-02-06 DIAGNOSIS — I10 Essential (primary) hypertension: Secondary | ICD-10-CM

## 2014-02-06 DIAGNOSIS — G8929 Other chronic pain: Secondary | ICD-10-CM

## 2014-02-06 MED ORDER — PANTOPRAZOLE SODIUM 40 MG PO TBEC: 40.0000 mg | DELAYED_RELEASE_TABLET | Freq: Two times a day (BID) | ORAL | Status: DC

## 2014-02-06 MED ORDER — POTASSIUM CHLORIDE CRYS ER 20 MEQ PO TBCR: EXTENDED_RELEASE_TABLET | ORAL | Status: DC

## 2014-02-06 MED ORDER — LOSARTAN POTASSIUM-HCTZ 100-25 MG PO TABS
ORAL_TABLET | ORAL | Status: DC
Start: 2014-02-06 — End: 2014-08-07

## 2014-02-06 MED ORDER — PROPRANOLOL HCL ER 80 MG PO CP24
ORAL_CAPSULE | ORAL | Status: DC
Start: 2014-02-06 — End: 2014-08-07

## 2014-02-06 MED ORDER — MELOXICAM 15 MG PO TABS: 15.0000 mg | ORAL_TABLET | Freq: Every day | ORAL | Status: DC

## 2014-02-21 ENCOUNTER — Telehealth: Payer: Self-pay | Admitting: *Deleted

## 2014-02-21 NOTE — Telephone Encounter (Signed)
I called the patient to ask if she got a prescription for Pantoprazole sodium 40 mg from [facility], Dr Reginia Forts.  She said she did.  I told her we just got a request from the fax from [facility] for Pantoprazole sodium 40 mg from our [employer]. The patient last saw Janett Billow on 04-19-2013.  The patient said we can cancel the prescription we got today from [facility]. I called [facility] and spoke to the pharmacist and she is canceling the prescription for [employer].

## 2014-04-12 ENCOUNTER — Ambulatory Visit (INDEPENDENT_AMBULATORY_CARE_PROVIDER_SITE_OTHER): Payer: 59 | Admitting: Obstetrics and Gynecology

## 2014-04-12 ENCOUNTER — Encounter: Payer: Self-pay | Admitting: Obstetrics and Gynecology

## 2014-04-12 VITALS — BP 130/70 | Ht 66.0 in | Wt 218.0 lb

## 2014-04-12 DIAGNOSIS — L918 Other hypertrophic disorders of the skin: Secondary | ICD-10-CM | POA: Diagnosis not present

## 2014-04-12 NOTE — Progress Notes (Addendum)
Patient ID: Catherine Navarro, female   DOB: 20-Feb-1955, 59 y.o.   MRN: [id number]  This chart was scribed for Jonnie Kind, MD by Donato Schultz, ED Scribe. This patient was seen in Room 2 and the patient's care was started at 9:09 AM.    [facility] Visit  Patient name: Catherine Navarro MRN [id number]  Date of birth: 1955/07/17  CC & HPI:  Catherine Navarro is a 59 y.o. female presenting today for removal of skin tags from the inner thighs bilaterally . No suspicion of malignancy, removed due to discomfort of leg movement abrading the tags..  ROS:  All systems have been reviewed and are negative unless otherwise indicated in the HPI.  This is a recurring problem for the patient.  Pertinent History Reviewed:   Reviewed: Significant for  Medical         Past Medical History  Diagnosis Date  . Hiatal hernia   . Gastritis   . Pancreatitis   . Sinusitis   . IBS (irritable bowel syndrome)   . GERD (gastroesophageal reflux disease)   . Hypothyroidism     with nodules  . Osteoarthritis   . HTN (hypertension)   . Anisocoria     right eye  . Family history of colon cancer   . Abdominal pain 11/27/2010  . Elevated LFTs 11/27/2010  . Fatty infiltration of liver   . Allergy   . Accessory skin tags 01/25/2013  . Colon polyps 12/22/2010    Tubular Adenoma and Hyperplastic Polyp                              Surgical Hx:    Past Surgical History  Procedure Laterality Date  . Tubal ligation    . Patella reconstruction      pins  . Cholecystectomy    . Anal fistulectomy    . Eye surgery    . Colonoscopy w/ biopsies    . Esophagogastroduodenoscopy     Medications: Reviewed & Updated - see associated section                       Current outpatient prescriptions:  .  aspirin 81 MG tablet, Take 81 mg by mouth daily.  , Disp: , Rfl:  .  Calcium Carbonate-Vitamin D (CALCIUM-D) 600-400 MG-UNIT TABS, Take by mouth 2 (two) times daily., Disp: , Rfl:  .  Cholecalciferol (VITAMIN  D) 2000 UNITS CAPS, Take by mouth.  , Disp: , Rfl:  .  fish oil-omega-3 fatty acids 1000 MG capsule, Take 1 g by mouth every other day., Disp: , Rfl:  .  fluticasone (FLONASE) 50 MCG/ACT nasal spray, Place 2 sprays into the nose daily., Disp: 16 g, Rfl: 1 .  Garlic 580 MG TABS, Take by mouth.  , Disp: , Rfl:  .  loratadine (CLARITIN REDITABS) 10 MG dissolvable tablet, Take 10 mg by mouth daily.  , Disp: , Rfl:  .  losartan-hydrochlorothiazide (HYZAAR) 100-25 MG per tablet, Take 1 tablet by mouth daily, Disp: 90 tablet, Rfl: 1 .  Multiple Vitamin (MULTIVITAMIN PO), Take by mouth 1 day or 1 dose.  , Disp: , Rfl:  .  pantoprazole (PROTONIX) 40 MG tablet, Take 1 tablet (40 mg total) by mouth 2 (two) times daily., Disp: 180 tablet, Rfl: 1 .  potassium chloride SA (K-DUR,KLOR-CON) 20 MEQ tablet, Take 1 tablet by mouth  daily, Disp:  90 tablet, Rfl: 3 .  propranolol ER (INDERAL LA) 80 MG 24 hr capsule, TAKE 1 CAPSULE BY MOUTH DAILY, Disp: 90 capsule, Rfl: 1 .  vitamin B-12 (CYANOCOBALAMIN) 1000 MCG tablet, Take 1,000 mcg by mouth daily., Disp: , Rfl:  .  meloxicam (MOBIC) 15 MG tablet, Take 1 tablet (15 mg total) by mouth daily. (Patient not taking: Reported on 04/12/2014), Disp: 90 tablet, Rfl: 0 .  valACYclovir (VALTREX) 1000 MG tablet, Take 2 tablets AS SOON as first symptoms of fever blister appear.  Take 2 tablets 12 hours later. (Patient not taking: Reported on 01/30/2014), Disp: 30 tablet, Rfl: 0 .  [DISCONTINUED] propranolol (INNOPRAN XL) 80 MG 24 hr capsule, Take 1 capsule (80 mg total) by mouth at bedtime., Disp: 90 capsule, Rfl: 3   Social History: Reviewed -  reports that she quit smoking about 16 years ago. Her smoking use included Cigarettes. She has never used smokeless tobacco.  Objective Findings:  Vitals: Blood pressure 130/70, height 5\' 6"  (1.676 m), weight 218 lb (98.884 kg).  Physical Examination: General appearance - alert, well appearing, and in no distress, oriented to person,  place, and time and overweight Pelvic - normal external genitalia, vulva, vagina On her inner thighs bilaterally, she has multiple sessile skin tags measuring up to 2mm in length Removed 13 skin tags from the left inner thigh and 9 skin tags from the right inner thigh. After prepping and local anethetic injected each site. Specimen discarded as it is benign character.  Assessment & Plan:   A:  1. Multiple skin tags both thighs, achrocordion   P:  1. Excised under local, bilateral skin tags  I personally performed the services described in this documentation, which was SCRIBED in my presence. The recorded information has been reviewed and considered accurate. It has been edited as necessary during review. Jonnie Kind, MD

## 2014-04-12 NOTE — Progress Notes (Signed)
Patient ID: Catherine Navarro, female   DOB: 11-03-1955, 59 y.o.   MRN: [id number] Pt here today for multiple skin tag removal. Pt states that she has some between her legs and under her arms and one on her neck that bothers her.

## 2014-06-14 ENCOUNTER — Other Ambulatory Visit: Payer: Self-pay

## 2014-06-14 DIAGNOSIS — I10 Essential (primary) hypertension: Secondary | ICD-10-CM

## 2014-06-14 MED ORDER — POTASSIUM CHLORIDE CRYS ER 20 MEQ PO TBCR: EXTENDED_RELEASE_TABLET | ORAL | Status: DC

## 2014-07-31 ENCOUNTER — Ambulatory Visit: Payer: 59 | Admitting: Physician Assistant

## 2014-08-07 ENCOUNTER — Ambulatory Visit (INDEPENDENT_AMBULATORY_CARE_PROVIDER_SITE_OTHER): Payer: 59 | Admitting: Physician Assistant

## 2014-08-07 ENCOUNTER — Encounter: Payer: Self-pay | Admitting: Physician Assistant

## 2014-08-07 VITALS — BP 138/74 | HR 51 | Temp 98.1°F | Resp 16 | Ht 65.0 in | Wt 212.8 lb

## 2014-08-07 DIAGNOSIS — G8929 Other chronic pain: Secondary | ICD-10-CM

## 2014-08-07 DIAGNOSIS — R739 Hyperglycemia, unspecified: Secondary | ICD-10-CM

## 2014-08-07 DIAGNOSIS — R1012 Left upper quadrant pain: Secondary | ICD-10-CM | POA: Diagnosis not present

## 2014-08-07 DIAGNOSIS — Z0000 Encounter for general adult medical examination without abnormal findings: Secondary | ICD-10-CM

## 2014-08-07 DIAGNOSIS — Z114 Encounter for screening for human immunodeficiency virus [HIV]: Secondary | ICD-10-CM

## 2014-08-07 DIAGNOSIS — M5134 Other intervertebral disc degeneration, thoracic region: Secondary | ICD-10-CM

## 2014-08-07 DIAGNOSIS — R8281 Pyuria: Secondary | ICD-10-CM

## 2014-08-07 DIAGNOSIS — N390 Urinary tract infection, site not specified: Secondary | ICD-10-CM | POA: Diagnosis not present

## 2014-08-07 DIAGNOSIS — K219 Gastro-esophageal reflux disease without esophagitis: Secondary | ICD-10-CM | POA: Diagnosis not present

## 2014-08-07 DIAGNOSIS — R109 Unspecified abdominal pain: Secondary | ICD-10-CM

## 2014-08-07 DIAGNOSIS — I10 Essential (primary) hypertension: Secondary | ICD-10-CM | POA: Diagnosis not present

## 2014-08-07 DIAGNOSIS — R8271 Bacteriuria: Secondary | ICD-10-CM

## 2014-08-07 DIAGNOSIS — R10A2 Flank pain, left side: Secondary | ICD-10-CM

## 2014-08-07 DIAGNOSIS — E079 Disorder of thyroid, unspecified: Secondary | ICD-10-CM

## 2014-08-07 LAB — POCT URINALYSIS DIPSTICK
Bilirubin, UA: NEGATIVE
GLUCOSE UA: NEGATIVE
KETONES UA: NEGATIVE
Nitrite, UA: NEGATIVE
PROTEIN UA: NEGATIVE
RBC UA: NEGATIVE
Spec Grav, UA: 1.015
Urobilinogen, UA: 0.2
pH, UA: 7.5

## 2014-08-07 LAB — COMPREHENSIVE METABOLIC PANEL
ALT: 16 U/L (ref 0–35)
AST: 19 U/L (ref 0–37)
Albumin: 4.2 g/dL (ref 3.5–5.2)
Alkaline Phosphatase: 75 U/L (ref 39–117)
BILIRUBIN TOTAL: 0.7 mg/dL (ref 0.2–1.2)
BUN: 17 mg/dL (ref 6–23)
CO2: 24 mEq/L (ref 19–32)
Calcium: 9.7 mg/dL (ref 8.4–10.5)
Chloride: 103 mEq/L (ref 96–112)
Creat: 0.72 mg/dL (ref 0.50–1.10)
GLUCOSE: 95 mg/dL (ref 70–99)
Potassium: 4 mEq/L (ref 3.5–5.3)
Sodium: 142 mEq/L (ref 135–145)
TOTAL PROTEIN: 6.5 g/dL (ref 6.0–8.3)

## 2014-08-07 LAB — CBC WITH DIFFERENTIAL/PLATELET
BASOS ABS: 0 10*3/uL (ref 0.0–0.1)
BASOS PCT: 0 % (ref 0–1)
EOS ABS: 0.1 10*3/uL (ref 0.0–0.7)
EOS PCT: 2 % (ref 0–5)
HCT: 37.9 % (ref 36.0–46.0)
Hemoglobin: 12.8 g/dL (ref 12.0–15.0)
LYMPHS PCT: 36 % (ref 12–46)
Lymphs Abs: 1.7 10*3/uL (ref 0.7–4.0)
MCH: 28.3 pg (ref 26.0–34.0)
MCHC: 33.8 g/dL (ref 30.0–36.0)
MCV: 83.8 fL (ref 78.0–100.0)
MONO ABS: 0.3 10*3/uL (ref 0.1–1.0)
MPV: 9.3 fL (ref 8.6–12.4)
Monocytes Relative: 6 % (ref 3–12)
Neutro Abs: 2.7 10*3/uL (ref 1.7–7.7)
Neutrophils Relative %: 56 % (ref 43–77)
Platelets: 184 10*3/uL (ref 150–400)
RBC: 4.52 MIL/uL (ref 3.87–5.11)
RDW: 13.2 % (ref 11.5–15.5)
WBC: 4.8 10*3/uL (ref 4.0–10.5)

## 2014-08-07 LAB — GLUCOSE, POCT (MANUAL RESULT ENTRY): POC GLUCOSE: 97 mg/dL (ref 70–99)

## 2014-08-07 LAB — POCT GLYCOSYLATED HEMOGLOBIN (HGB A1C): HEMOGLOBIN A1C: 5.4

## 2014-08-07 LAB — TSH: TSH: 1.181 u[IU]/mL (ref 0.350–4.500)

## 2014-08-07 LAB — HIV ANTIBODY (ROUTINE TESTING W REFLEX): HIV 1&2 Ab, 4th Generation: NONREACTIVE

## 2014-08-07 MED ORDER — PANTOPRAZOLE SODIUM 40 MG PO TBEC
40.0000 mg | DELAYED_RELEASE_TABLET | Freq: Two times a day (BID) | ORAL | Status: DC
Start: 2014-08-07 — End: 2015-07-12

## 2014-08-07 MED ORDER — LOSARTAN POTASSIUM-HCTZ 100-25 MG PO TABS: ORAL_TABLET | ORAL | Status: DC

## 2014-08-07 MED ORDER — PROPRANOLOL HCL ER 80 MG PO CP24: ORAL_CAPSULE | ORAL | Status: DC

## 2014-08-07 MED ORDER — MELOXICAM 15 MG PO TABS: 15.0000 mg | ORAL_TABLET | Freq: Every day | ORAL | Status: DC

## 2014-08-07 MED ORDER — POTASSIUM CHLORIDE CRYS ER 20 MEQ PO TBCR: EXTENDED_RELEASE_TABLET | ORAL | Status: DC

## 2014-08-07 NOTE — Progress Notes (Addendum)
Patient ID: Catherine Navarro, female    DOB: Jun 04, 1955, 59 y.o.   MRN: [id number]  PCP: Heaton Sarin, PA-C  Chief Complaint  Patient presents with  . Annual Exam    Subjective:   HPI: Presents for annual exam. GYN care with Dr. Glo Herring and therefore not needed today.  She continues to have RIGHT flank pain, exacerbated by activity.  RIGHT ear discomfort recently, after putting out mulch in her yard.  She has anxiety about GI tract cancer. Another family member, a cousin, has been diagnosed with a GI cancer and isn't expected to survive the year. Her last colonoscopy was 4 years ago, and she's due again 11/2015. She has no bowel/bladder changes, no nausea or vomiting. She's been working hard on healthy eating and being more active, and is losing weight intentionally. Her goal weight is 165 lbs. Was hoping to be below 200 lbs by her birthday next week.  GERD is stable/controlled. HTN has been controlled. Hyperglycemia has been well controlled with her lifestyle modifications. She's tolerating her medications well without adverse effects.     Patient Active Problem List   Diagnosis Date Noted  . Cutaneous skin tags 04/12/2014  . Severe obesity (BMI >= 40) 08/01/2013  . Accessory skin tags 01/25/2013  . 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 Family history of malignant neoplasm of gastrointestinal tract 12/22/2010  . Diverticulosis of colon (without mention of hemorrhage) 12/22/2010  . Benign neoplasm of colon 12/22/2010  . Fatty liver 11/27/2010  . Costochondral chest pain 11/27/2010  . GERD (gastroesophageal reflux disease) 11/27/2010  . HELICOBACTER PYLORI GASTRITIS, HX OF 05/02/2009  . ABDOMINAL PAIN-RUQ 07/12/2007  . THYROID DISORDER 03/23/2007  . HYPERTENSION 03/23/2007  . SINUSITIS 03/23/2007  . IBS 03/23/2007  . OSTEOARTHRITIS 03/23/2007  . LIVER FUNCTION TESTS, ABNORMAL, HX OF 03/23/2007  . PANCREATITIS, ACUTE, HX OF 03/23/2007  . GASTRITIS 12/21/2005  . HIATAL HERNIA  12/21/2005    Past Medical History  Diagnosis Date  . Hiatal hernia   . Gastritis   . Pancreatitis   . Sinusitis   . IBS (irritable bowel syndrome)   . GERD (gastroesophageal reflux disease)   . Hypothyroidism     with nodules  . Osteoarthritis   . HTN (hypertension)   . Anisocoria     right eye  . Family history of colon cancer   . Abdominal pain 11/27/2010  . Elevated LFTs 11/27/2010  . Fatty infiltration of liver   . Allergy   . Accessory skin tags 01/25/2013  . Colon polyps 12/22/2010    Tubular Adenoma and Hyperplastic Polyp     Prior to Admission medications   Medication Sig Start Date End Date Taking? Authorizing Provider  aspirin 81 MG tablet Take 81 mg by mouth daily.     Yes Historical Provider, MD  Calcium Carbonate-Vitamin D (CALCIUM-D) 600-400 MG-UNIT TABS Take by mouth 2 (two) times daily.   Yes Historical Provider, MD  Cholecalciferol (VITAMIN D) 2000 UNITS CAPS Take by mouth.     Yes Historical Provider, MD  fish oil-omega-3 fatty acids 1000 MG capsule Take 1 g by mouth every other day.   Yes Historical Provider, MD  fluticasone (FLONASE) 50 MCG/ACT nasal spray Place 2 sprays into the nose daily. 07/26/12  Yes Thomasena Vandenheuvel, PA-C  Garlic 585 MG TABS Take by mouth.     Yes Historical Provider, MD  loratadine (CLARITIN REDITABS) 10 MG dissolvable tablet Take 10 mg by mouth daily.     Yes Historical Provider, MD  losartan-hydrochlorothiazide (HYZAAR) 100-25 MG per tablet Take 1 tablet by mouth daily 08/07/14  Yes Karmina Zufall, PA-C  meloxicam (MOBIC) 15 MG tablet Take 1 tablet (15 mg total) by mouth daily. 08/07/14  Yes Alyssamae Klinck, PA-C  Multiple Vitamin (MULTIVITAMIN PO) Take by mouth 1 day or 1 dose.     Yes Historical Provider, MD  pantoprazole (PROTONIX) 40 MG tablet Take 1 tablet (40 mg total) by mouth 2 (two) times daily. 08/07/14  Yes Jaimya Feliciano, PA-C  potassium chloride SA (K-DUR,KLOR-CON) 20 MEQ tablet Take 1 tablet by mouth  daily 08/07/14  Yes Keishawna Carranza, PA-C  propranolol ER (INDERAL LA) 80 MG 24 hr capsule TAKE 1 CAPSULE BY MOUTH DAILY 08/07/14  Yes Tripp Goins, PA-C  valACYclovir (VALTREX) 1000 MG tablet Take 2 tablets AS SOON as first symptoms of fever blister appear.  Take 2 tablets 12 hours later. 07/26/12  Yes Pa Tennant, PA-C  vitamin B-12 (CYANOCOBALAMIN) 1000 MCG tablet Take 1,000 mcg by mouth daily.   Yes Historical Provider, MD    Allergies  Allergen Reactions  . Amoxicillin     rash  . Codeine     REACTION: headache    Past Surgical History  Procedure Laterality Date  . Tubal ligation    . Patella reconstruction      pins  . Cholecystectomy    . Anal fistulectomy    . Eye surgery    . Colonoscopy w/ biopsies    . Esophagogastroduodenoscopy      Family History  Problem Relation Age of Onset  . Breast cancer Maternal Aunt   . Diabetes Mother   . Irritable bowel syndrome Mother   . Stroke Mother   . Heart attack Paternal Grandfather   . COPD Father     emphysema  . Cancer Father   . Cancer Brother 50    colorectal cancer  . Alzheimer's disease Paternal Grandmother   . Alzheimer's disease Maternal Aunt   . Cancer Sister     colorectal and liver  . Cancer Brother 50    colon  . Diabetes Sister   . Hyperlipidemia Sister   . Hypertension Sister   . Diabetes Maternal Grandmother   . Heart disease Neg Hx     History   Social History  . Marital Status: Divorced    Spouse Name: BF: Laddie Aquas  . Number of Children: 3  . Years of Education: 12+   Occupational History  . IT Support Other   Social History Main Topics  . Smoking status: Former Smoker    Types: Cigarettes    Quit date: 01/26/1998  . Smokeless tobacco: Never Used  . Alcohol Use: No  . Drug Use: No  . Sexual Activity: No   Other Topics Concern  . None   Social History Narrative   Lives with her boyfriend, Laddie Aquas.       Review of Systems  Constitutional: Negative.   HENT: Positive for ear pain ("feels weird"  on the RIGHT). Negative for congestion, dental problem, drooling, ear discharge, facial swelling, hearing loss, mouth sores, nosebleeds, postnasal drip, rhinorrhea, sinus pressure, sneezing, sore throat, tinnitus, trouble swallowing and voice change.   Eyes: Negative.   Respiratory: Negative.   Cardiovascular: Negative.   Gastrointestinal: Negative.   Endocrine: Negative.   Genitourinary: Negative.   Musculoskeletal: Positive for back pain (RIGHT flank). Negative for myalgias, joint swelling, arthralgias, gait problem, neck pain and neck stiffness.  Skin: Negative.   Allergic/Immunologic: Negative.  Neurological: Negative.   Psychiatric/Behavioral: Negative.         Objective:  Physical Exam  Constitutional: She is oriented to person, place, and time. Vital signs are normal. She appears well-developed and well-nourished. She is active and cooperative. No distress.  BP 138/74 mmHg  Pulse 51  Temp(Src) 98.1 F (36.7 C) (Oral)  Resp 16  Ht 5\' 5"  (1.651 m)  Wt 212 lb 12.8 oz (96.525 kg)  BMI 35.41 kg/m2  SpO2 98%   HENT:  Head: Normocephalic and atraumatic.  Right Ear: Hearing, tympanic membrane, external ear and ear canal normal. No foreign bodies.  Left Ear: Hearing, tympanic membrane, external ear and ear canal normal. No foreign bodies.  Nose: Nose normal.  Mouth/Throat: Uvula is midline, oropharynx is clear and moist and mucous membranes are normal. No oral lesions. Normal dentition. No dental abscesses or uvula swelling. No oropharyngeal exudate.  Eyes: Conjunctivae, EOM and lids are normal. Right eye exhibits no discharge. Left eye exhibits no discharge. No scleral icterus.  Fundoscopic exam:      The right eye shows no arteriolar narrowing, no AV nicking, no exudate, no hemorrhage and no papilledema. The right eye shows red reflex.       The left eye shows no arteriolar narrowing, no AV nicking, no exudate, no hemorrhage and no papilledema. The left eye shows red reflex.    RIGHT anisocoria, baseline  Neck: Trachea normal, normal range of motion and full passive range of motion without pain. Neck supple. No spinous process tenderness and no muscular tenderness present. No thyroid mass and no thyromegaly present.  Cardiovascular: Normal rate, regular rhythm, normal heart sounds, intact distal pulses and normal pulses.   Pulmonary/Chest: Effort normal and breath sounds normal.  Musculoskeletal: She exhibits no edema or tenderness.       Cervical back: Normal.       Thoracic back: Normal.       Lumbar back: Normal.  Lymphadenopathy:       Head (right side): No tonsillar, no preauricular, no posterior auricular and no occipital adenopathy present.       Head (left side): No tonsillar, no preauricular, no posterior auricular and no occipital adenopathy present.    She has no cervical adenopathy.       Right: No supraclavicular adenopathy present.       Left: No supraclavicular adenopathy present.  Neurological: She is alert and oriented to person, place, and time. She has normal strength and normal reflexes. No cranial nerve deficit. She exhibits normal muscle tone. Coordination and gait normal.  Skin: Skin is warm, dry and intact. No rash noted. She is not diaphoretic. No cyanosis or erythema. Nails show no clubbing.  Psychiatric: She has a normal mood and affect. Her speech is normal and behavior is normal. Judgment and thought content normal.   Results for orders placed or performed in visit on 08/07/14  POCT glucose (manual entry)  Result Value Ref Range   POC Glucose 97 70 - 99 mg/dl  POCT glycosylated hemoglobin (Hb A1C)  Result Value Ref Range   Hemoglobin A1C 5.4   POCT urinalysis dipstick  Result Value Ref Range   Color, UA Yellow    Clarity, UA Clear    Glucose, UA negative    Bilirubin, UA Negative    Ketones, UA Negative    Spec Grav, UA 1.015    Blood, UA Negative    pH, UA 7.5    Protein, UA Negative    Urobilinogen, UA  0.2    Nitrite, UA  Negative    Leukocytes, UA moderate (2+) (A) Negative           Assessment & Plan:  1. Annual physical exam Age appropriate anticipatory guidance provided.   2. Essential hypertension Controlled. Continue current treatment.  - losartan-hydrochlorothiazide (HYZAAR) 100-25 MG per tablet; Take 1 tablet by mouth daily  Dispense: 90 tablet; Refill: 3 - propranolol ER (INDERAL LA) 80 MG 24 hr capsule; TAKE 1 CAPSULE BY MOUTH DAILY  Dispense: 90 capsule; Refill: 3 - potassium chloride SA (K-DUR,KLOR-CON) 20 MEQ tablet; Take 1 tablet by mouth  daily  Dispense: 90 tablet; Refill: 3 - CBC with Differential/Platelet - Comprehensive metabolic panel - TSH - POCT urinalysis dipstick  3. Gastroesophageal reflux disease, esophagitis presence not specified Stable. Asymptomatic. Continue PPI. - pantoprazole (PROTONIX) 40 MG tablet; Take 1 tablet (40 mg total) by mouth 2 (two) times daily.  Dispense: 180 tablet; Refill: 3  4. DDD (degenerative disc disease), thoracic Stable. Likely the cause of the flank pain. - meloxicam (MOBIC) 15 MG tablet; Take 1 tablet (15 mg total) by mouth daily.  Dispense: 90 tablet; Refill: 3  5. Left flank pain, chronic This is likely musculoskeletal. Continue anti-inflammatory as needed. - meloxicam (MOBIC) 15 MG tablet; Take 1 tablet (15 mg total) by mouth daily.  Dispense: 90 tablet; Refill: 3  6. Hyperglycemia Well controlled with lifestyle. Congratulated on her efforts and success, and encouraged continued work to reach her goal. - POCT glucose (manual entry) - POCT glycosylated hemoglobin (Hb A1C)  7. Screening for HIV (human immunodeficiency virus) - HIV antibody  8. Bactiuria with pyuria Asymptomatic. Await Ucx. -Urine Culture   Fara Chute, PA-C Physician Assistant-Certified [facility]

## 2014-08-07 NOTE — Patient Instructions (Signed)
I will contact you with your lab results as soon as they are available.   If you have not heard from me in 2 weeks, please contact me.  The fastest way to get your results is to register for My Chart (see the instructions on the last page of this printout).  Keeping You Healthy  Get These Tests  Blood Pressure- Have your blood pressure checked by your healthcare provider at least once a year.  Normal blood pressure is 120/80.  Weight- Have your body mass index (BMI) calculated to screen for obesity.  BMI is a measure of body fat based on height and weight.  You can calculate your own BMI at www.nhlbisupport.com/bmi/  Cholesterol- Have your cholesterol checked every year.  Diabetes- Have your blood sugar checked every year if you have high blood pressure, high cholesterol, a family history of diabetes or if you are overweight.  Pap Test - Have a pap test every 1 to 5 years if you have been sexually active.  If you are older than 65 and recent pap tests have been normal you may not need additional pap tests.  In addition, if you have had a hysterectomy  for benign disease additional pap tests are not necessary.  Mammogram-Yearly mammograms are essential for early detection of breast cancer  Screening for Colon Cancer- Colonoscopy starting at age 50. Screening may begin sooner depending on your family history and other health conditions.  Follow up colonoscopy as directed by your Gastroenterologist.  Screening for Osteoporosis- Screening begins at age 65 with bone density scanning, sooner if you are at higher risk for developing Osteoporosis.  Get these medicines  Calcium with Vitamin D- Your body requires 1200-1500 mg of Calcium a day and 800-1000 IU of Vitamin D a day.  You can only absorb 500 mg of Calcium at a time therefore Calcium must be taken in 2 or 3 separate doses throughout the day.  Hormones- Hormone therapy has been associated with increased risk for certain cancers and heart  disease.  Talk to your healthcare provider about if you need relief from menopausal symptoms.  Aspirin- Ask your healthcare provider about taking Aspirin to prevent Heart Disease and Stroke.  Get these Immuniztions  Flu shot- Every fall  Pneumonia shot- Once after the age of 65; if you are younger ask your healthcare provider if you need a pneumonia shot.  Tetanus- Every ten years.  Zostavax- Once after the age of 60 to prevent shingles.  Take these steps  Don't smoke- Your healthcare provider can help you quit. For tips on how to quit, ask your healthcare provider or go to www.smokefree.gov or call 1-800 QUIT-NOW.  Be physically active- Exercise 5 days a week for a minimum of 30 minutes.  If you are not already physically active, start slow and gradually work up to 30 minutes of moderate physical activity.  Try walking, dancing, bike riding, swimming, etc.  Eat a healthy diet- Eat a variety of healthy foods such as fruits, vegetables, whole grains, low fat milk, low fat cheeses, yogurt, lean meats, chicken, fish, eggs, dried beans, tofu, etc.  For more information go to www.thenutritionsource.org  Dental visit- Brush and floss teeth twice daily; visit your dentist twice a year.  Eye exam- Visit your Optometrist or Ophthalmologist yearly.  Drink alcohol in moderation- Limit alcohol intake to one drink or less a day.  Never drink and drive.  Depression- Your emotional health is as important as your physical health.  If you're   feeling down or losing interest in things you normally enjoy, please talk to your healthcare provider.  Seat Belts- can save your life; always wear one  Smoke/Carbon Monoxide detectors- These detectors need to be installed on the appropriate level of your home.  Replace batteries at least once a year.  Violence- If anyone is threatening or hurting you, please tell your healthcare provider.  Living Will/ Health care power of attorney- Discuss with your  healthcare provider and family. 

## 2014-08-07 NOTE — Progress Notes (Signed)
   Subjective:    Patient ID: Catherine Navarro, female    DOB: [birth year], 59 y.o.   MRN: [id number]  HPI    Review of Systems  Constitutional: Negative.   HENT: Negative.   Eyes: Negative.   Respiratory: Negative.   Cardiovascular: Negative.   Gastrointestinal: Negative.   Endocrine: Negative.   Genitourinary: Negative.   Musculoskeletal: Negative.   Skin: Negative.   Allergic/Immunologic: Negative.   Neurological: Negative.   Hematological: Negative.   Psychiatric/Behavioral: Negative.        Objective:   Physical Exam        Assessment & Plan:

## 2014-08-08 LAB — URINE CULTURE
COLONY COUNT: NO GROWTH
Organism ID, Bacteria: NO GROWTH

## 2014-08-25 ENCOUNTER — Telehealth: Payer: Self-pay | Admitting: Family Medicine

## 2014-08-25 NOTE — Telephone Encounter (Signed)
lmom to call and reschedule appt in Jan 2017 for a 6 month ov with chelle

## 2014-09-25 ENCOUNTER — Ambulatory Visit (INDEPENDENT_AMBULATORY_CARE_PROVIDER_SITE_OTHER): Payer: 59 | Admitting: Emergency Medicine

## 2014-09-25 ENCOUNTER — Ambulatory Visit (INDEPENDENT_AMBULATORY_CARE_PROVIDER_SITE_OTHER): Payer: 59

## 2014-09-25 ENCOUNTER — Encounter: Payer: Self-pay | Admitting: Physician Assistant

## 2014-09-25 VITALS — BP 138/74 | HR 59 | Temp 98.6°F | Resp 16 | Ht 64.0 in | Wt 209.8 lb

## 2014-09-25 DIAGNOSIS — R109 Unspecified abdominal pain: Secondary | ICD-10-CM

## 2014-09-25 DIAGNOSIS — Z1159 Encounter for screening for other viral diseases: Secondary | ICD-10-CM | POA: Diagnosis not present

## 2014-09-25 DIAGNOSIS — R110 Nausea: Secondary | ICD-10-CM | POA: Diagnosis not present

## 2014-09-25 LAB — CBC WITH DIFFERENTIAL/PLATELET
BASOS PCT: 1 % (ref 0–1)
Basophils Absolute: 0.1 10*3/uL (ref 0.0–0.1)
Eosinophils Absolute: 0.1 10*3/uL (ref 0.0–0.7)
Eosinophils Relative: 1 % (ref 0–5)
HEMATOCRIT: 39.2 % (ref 36.0–46.0)
Hemoglobin: 13.5 g/dL (ref 12.0–15.0)
LYMPHS ABS: 1.5 10*3/uL (ref 0.7–4.0)
Lymphocytes Relative: 27 % (ref 12–46)
MCH: 28.4 pg (ref 26.0–34.0)
MCHC: 34.4 g/dL (ref 30.0–36.0)
MCV: 82.5 fL (ref 78.0–100.0)
MONO ABS: 0.4 10*3/uL (ref 0.1–1.0)
MPV: 9.2 fL (ref 8.6–12.4)
Monocytes Relative: 7 % (ref 3–12)
NEUTROS ABS: 3.6 10*3/uL (ref 1.7–7.7)
Neutrophils Relative %: 64 % (ref 43–77)
Platelets: 197 10*3/uL (ref 150–400)
RBC: 4.75 MIL/uL (ref 3.87–5.11)
RDW: 13.5 % (ref 11.5–15.5)
WBC: 5.6 10*3/uL (ref 4.0–10.5)

## 2014-09-25 LAB — POCT URINALYSIS DIPSTICK
BILIRUBIN UA: NEGATIVE
Blood, UA: NEGATIVE
Glucose, UA: NEGATIVE
Ketones, UA: NEGATIVE
Leukocytes, UA: NEGATIVE
Nitrite, UA: NEGATIVE
PROTEIN UA: NEGATIVE
Urobilinogen, UA: 0.2
pH, UA: 5.5

## 2014-09-25 LAB — POCT UA - MICROSCOPIC ONLY
Bacteria, U Microscopic: NEGATIVE
CRYSTALS, UR, HPF, POC: NEGATIVE
Casts, Ur, LPF, POC: NEGATIVE
EPITHELIAL CELLS, URINE PER MICROSCOPY: NEGATIVE
MUCUS UA: NEGATIVE
RBC, urine, microscopic: NEGATIVE
WBC, Ur, HPF, POC: NEGATIVE
YEAST UA: NEGATIVE

## 2014-09-25 LAB — COMPREHENSIVE METABOLIC PANEL
ALBUMIN: 4.5 g/dL (ref 3.6–5.1)
ALT: 16 U/L (ref 6–29)
AST: 18 U/L (ref 10–35)
Alkaline Phosphatase: 86 U/L (ref 33–130)
BUN: 17 mg/dL (ref 7–25)
CALCIUM: 10.1 mg/dL (ref 8.6–10.4)
CHLORIDE: 100 mmol/L (ref 98–110)
CO2: 27 mmol/L (ref 20–31)
Creat: 0.76 mg/dL (ref 0.50–0.99)
Glucose, Bld: 101 mg/dL — ABNORMAL HIGH (ref 65–99)
POTASSIUM: 3.8 mmol/L (ref 3.5–5.3)
SODIUM: 138 mmol/L (ref 135–146)
TOTAL PROTEIN: 7.2 g/dL (ref 6.1–8.1)
Total Bilirubin: 0.6 mg/dL (ref 0.2–1.2)

## 2014-09-25 LAB — AMYLASE: AMYLASE: 32 U/L (ref 0–105)

## 2014-09-25 LAB — LIPASE: LIPASE: 6 U/L — AB (ref 7–60)

## 2014-09-25 MED ORDER — ONDANSETRON 8 MG PO TBDP: 8.0000 mg | ORAL_TABLET | Freq: Three times a day (TID) | ORAL | Status: DC | PRN

## 2014-09-25 NOTE — Patient Instructions (Signed)
I will contact you with your lab results as soon as they are available.   If you have not heard from me in 2 weeks, please contact me.  The fastest way to get your results is to register for My Chart (see the instructions on the last page of this printout).   

## 2014-09-25 NOTE — Progress Notes (Signed)
Patient ID: Catherine Navarro, female    DOB: 13-Jun-1955, 59 y.o.   MRN: [id number]  PCP: Wynne Dust  Subjective:   Chief Complaint  Patient presents with  . Flank Pain    LEFT x 3 days with slight nausea  . wellness form to be completed    HPI Presents for evaluation of LEFT upper abdominal pain. Crampy left upper abdomen, with shooting and pinching around the belly button. Worse with eating. Seems to be getting worse since it began on 09/22/14. Hasn't had much to eat today as a result. Looser stool today than usual, but no diarrhea, but notes that she was more constipated than usual the previous week. No melena or hematochezia. No new or unusual foods. No fever, chills. Last week thought she had a yeast infection, so used OTC Monistat. No other vaginal symptoms. On the first day of symptoms she had frequent urination, but since then it has returned to normal.  History of pancreatitis, procedure induced, also history of gastritis, IBS, GERD, fatty liver and diverticulosis. S/p cholecystectomy.  The Wellness Forms just need the result of her most recent labs and my signature.  Review of Systems As above.    Patient Active Problem List   Diagnosis Date Noted  . Cutaneous skin tags 04/12/2014  . BMI 35.0-35.9,adult 08/01/2013  . 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 Family history of malignant neoplasm of gastrointestinal tract 12/22/2010  . Diverticulosis of colon (without mention of hemorrhage) 12/22/2010  . Benign neoplasm of colon 12/22/2010  . Fatty liver 11/27/2010  . Costochondral chest pain 11/27/2010  . GERD (gastroesophageal reflux disease) 11/27/2010  . HELICOBACTER PYLORI GASTRITIS, HX OF 05/02/2009  . ABDOMINAL PAIN-RUQ 07/12/2007  . Disorder of thyroid 03/23/2007  . HYPERTENSION 03/23/2007  . SINUSITIS 03/23/2007  . IBS 03/23/2007  . OSTEOARTHRITIS 03/23/2007  . LIVER FUNCTION TESTS, ABNORMAL, HX OF 03/23/2007  . PANCREATITIS, ACUTE, HX OF 03/23/2007  .  GASTRITIS 12/21/2005  . HIATAL HERNIA 12/21/2005     Prior to Admission medications   Medication Sig Start Date End Date Taking? Authorizing Provider  aspirin 81 MG tablet Take 81 mg by mouth daily.     Yes Historical Provider, MD  Calcium Carbonate-Vitamin D (CALCIUM-D) 600-400 MG-UNIT TABS Take by mouth 2 (two) times daily.   Yes Historical Provider, MD  Cholecalciferol (VITAMIN D) 2000 UNITS CAPS Take by mouth.     Yes Historical Provider, MD  fish oil-omega-3 fatty acids 1000 MG capsule Take 1 g by mouth every other day.   Yes Historical Provider, MD  fluticasone (FLONASE) 50 MCG/ACT nasal spray Place 2 sprays into the nose daily. 07/26/12  Yes Maija Biggers, PA-C  Garlic 009 MG TABS Take by mouth.     Yes Historical Provider, MD  loratadine (CLARITIN REDITABS) 10 MG dissolvable tablet Take 10 mg by mouth daily.     Yes Historical Provider, MD  losartan-hydrochlorothiazide (HYZAAR) 100-25 MG per tablet Take 1 tablet by mouth daily 08/07/14  Yes Brad Mcgaughy, PA-C  meloxicam (MOBIC) 15 MG tablet Take 1 tablet (15 mg total) by mouth daily. 08/07/14  Yes Solomiya Pascale, PA-C  Multiple Vitamin (MULTIVITAMIN PO) Take by mouth 1 day or 1 dose.     Yes Historical Provider, MD  pantoprazole (PROTONIX) 40 MG tablet Take 1 tablet (40 mg total) by mouth 2 (two) times daily. 08/07/14  Yes Karington Zarazua, PA-C  potassium chloride SA (K-DUR,KLOR-CON) 20 MEQ tablet Take 1 tablet by mouth  daily 08/07/14  Yes Lillianne Eick  Jacqulynn Cadet, PA-C  propranolol ER (INDERAL LA) 80 MG 24 hr capsule TAKE 1 CAPSULE BY MOUTH DAILY 08/07/14  Yes Ashten Prats, PA-C  pyridOXINE (VITAMIN B-6) 100 MG tablet Take 200 mg by mouth daily.   Yes Historical Provider, MD  vitamin B-12 (CYANOCOBALAMIN) 1000 MCG tablet Take 1,000 mcg by mouth daily.   Yes Historical Provider, MD  valACYclovir (VALTREX) 1000 MG tablet Take 2 tablets AS SOON as first symptoms of fever blister appear.  Take 2 tablets 12 hours later. Patient not taking: Reported on  09/25/2014 07/26/12   Harrison Mons, PA-C     Allergies  Allergen Reactions  . Amoxicillin     rash  . Codeine     REACTION: headache       Objective:  Physical Exam  Constitutional: She is oriented to person, place, and time. She appears well-developed and well-nourished. No distress.  BP 138/74 mmHg  Pulse 59  Temp(Src) 98.6 F (37 C) (Oral)  Resp 16  Ht 5\' 4"  (1.626 m)  Wt 209 lb 12.8 oz (95.165 kg)  BMI 35.99 kg/m2   Eyes: Conjunctivae and EOM are normal. No scleral icterus. Right pupil not round: RIGHT anisocoria.  Neck: No thyromegaly present.  Cardiovascular: Normal rate, regular rhythm, normal heart sounds and intact distal pulses.   Pulmonary/Chest: Effort normal and breath sounds normal.  Abdominal: Normal appearance and bowel sounds are normal. She exhibits no shifting dullness, no distension, no pulsatile liver, no fluid wave, no abdominal bruit, no ascites, no pulsatile midline mass and no mass. There is no tenderness. There is no CVA tenderness. No hernia.    Lymphadenopathy:    She has no cervical adenopathy.  Neurological: She is alert and oriented to person, place, and time.  Skin: Skin is warm and dry.  Psychiatric: She has a normal mood and affect. Her behavior is normal.    Acute Abdominal Series: [facility] reading (PRIMARY) by  Dr. Everlene Farrier. Normal. Increased stool in RIGHT colon.   Results for orders placed or performed in visit on 09/25/14  POCT UA - Microscopic Only  Result Value Ref Range   WBC, Ur, HPF, POC Negative    RBC, urine, microscopic Negative    Bacteria, U Microscopic Negative    Mucus, UA Negative    Epithelial cells, urine per micros Negative    Crystals, Ur, HPF, POC Negative    Casts, Ur, LPF, POC Negative    Yeast, UA Negative   POCT urinalysis dipstick  Result Value Ref Range   Color, UA Light Yellow    Clarity, UA Clear    Glucose, UA Negative    Bilirubin, UA Negative    Ketones, UA Negative    Spec Grav, UA <=1.005     Blood, UA negative    pH, UA 5.5    Protein, UA negative    Urobilinogen, UA 0.2    Nitrite, UA Negative    Leukocytes, UA Negative Negative       Assessment & Plan:   1. Left flank pain 2. Nausea without vomiting Normal acute abdominal series. Likely viral gastroenteritis. Rest. Fluids. Diet as tolerated. Ondansetron PRN. Anticipatory guidance. Await lab results.  - POCT UA - Microscopic Only - POCT urinalysis dipstick - DG Abd Acute W/Chest - CBC with Differential/Platelet - Comprehensive metabolic panel - Amylase - Lipase - ondansetron (ZOFRAN-ODT) 8 MG disintegrating tablet; Take 1 tablet (8 mg total) by mouth every 8 (eight) hours as needed for nausea.  Dispense: 30 tablet; Refill: 0  3. Need for hepatitis C screening test - Hepatitis C antibody   Fara Chute, PA-C Physician Assistant-Certified [facility]

## 2014-09-26 LAB — HEPATITIS C ANTIBODY: HCV Ab: NEGATIVE

## 2014-12-18 ENCOUNTER — Other Ambulatory Visit: Payer: Self-pay

## 2014-12-18 DIAGNOSIS — Z1231 Encounter for screening mammogram for malignant neoplasm of breast: Secondary | ICD-10-CM

## 2015-01-29 ENCOUNTER — Ambulatory Visit
Admission: RE | Admit: 2015-01-29 | Discharge: 2015-01-29 | Disposition: A | Discharge status: Home / Self Care | Payer: 59 | Source: Ambulatory Visit

## 2015-01-29 DIAGNOSIS — Z1231 Encounter for screening mammogram for malignant neoplasm of breast: Secondary | ICD-10-CM

## 2015-02-05 ENCOUNTER — Ambulatory Visit (INDEPENDENT_AMBULATORY_CARE_PROVIDER_SITE_OTHER): Payer: 59 | Admitting: Physician Assistant

## 2015-02-05 ENCOUNTER — Encounter: Payer: Self-pay | Admitting: Physician Assistant

## 2015-02-05 ENCOUNTER — Ambulatory Visit: Payer: 59 | Admitting: Physician Assistant

## 2015-02-05 VITALS — BP 134/76 | HR 62 | Temp 98.5°F | Resp 16 | Ht 65.0 in | Wt 222.0 lb

## 2015-02-05 DIAGNOSIS — E079 Disorder of thyroid, unspecified: Secondary | ICD-10-CM | POA: Diagnosis not present

## 2015-02-05 DIAGNOSIS — M5134 Other intervertebral disc degeneration, thoracic region: Secondary | ICD-10-CM

## 2015-02-05 DIAGNOSIS — K219 Gastro-esophageal reflux disease without esophagitis: Secondary | ICD-10-CM | POA: Diagnosis not present

## 2015-02-05 DIAGNOSIS — Z6836 Body mass index (BMI) 36.0-36.9, adult: Secondary | ICD-10-CM

## 2015-02-05 DIAGNOSIS — R739 Hyperglycemia, unspecified: Secondary | ICD-10-CM

## 2015-02-05 DIAGNOSIS — D126 Benign neoplasm of colon, unspecified: Secondary | ICD-10-CM

## 2015-02-05 DIAGNOSIS — Z23 Encounter for immunization: Secondary | ICD-10-CM

## 2015-02-05 DIAGNOSIS — I10 Essential (primary) hypertension: Secondary | ICD-10-CM

## 2015-02-05 LAB — GLUCOSE, POCT (MANUAL RESULT ENTRY): POC GLUCOSE: 99 mg/dL (ref 70–99)

## 2015-02-05 LAB — TSH: TSH: 0.83 u[IU]/mL (ref 0.350–4.500)

## 2015-02-05 LAB — T3, FREE: T3 FREE: 2.9 pg/mL (ref 2.3–4.2)

## 2015-02-05 LAB — COMPREHENSIVE METABOLIC PANEL
ALK PHOS: 92 U/L (ref 33–130)
ALT: 18 U/L (ref 6–29)
AST: 20 U/L (ref 10–35)
Albumin: 4.4 g/dL (ref 3.6–5.1)
BILIRUBIN TOTAL: 0.5 mg/dL (ref 0.2–1.2)
BUN: 17 mg/dL (ref 7–25)
CALCIUM: 10.1 mg/dL (ref 8.6–10.4)
CO2: 27 mmol/L (ref 20–31)
Chloride: 99 mmol/L (ref 98–110)
Creat: 0.67 mg/dL (ref 0.50–0.99)
GLUCOSE: 92 mg/dL (ref 65–99)
Potassium: 4.1 mmol/L (ref 3.5–5.3)
SODIUM: 136 mmol/L (ref 135–146)
Total Protein: 7.1 g/dL (ref 6.1–8.1)

## 2015-02-05 LAB — POCT GLYCOSYLATED HEMOGLOBIN (HGB A1C): HEMOGLOBIN A1C: 5.5

## 2015-02-05 LAB — T4, FREE: Free T4: 1.29 ng/dL (ref 0.80–1.80)

## 2015-02-05 MED ORDER — ZOSTER VACCINE LIVE 19400 UNT/0.65ML ~~LOC~~ SOLR: 0.6500 mL | Freq: Once | SUBCUTANEOUS | Status: DC

## 2015-02-05 MED ORDER — FLUTICASONE PROPIONATE 50 MCG/ACT NA SUSP: 2.0000 | Freq: Every day | NASAL | Status: DC

## 2015-02-05 NOTE — Patient Instructions (Signed)
Ask your pharmacist about a smaller potassium product. I am happy to switch you, but don't know what to switch you to!  Also, try the "chin-tuck" when you swallow.  I will contact you with your lab results as soon as they are available.   If you have not heard from me in 2 weeks, please contact me.  The fastest way to get your results is to register for My Chart (see the instructions on the last page of this printout).

## 2015-02-05 NOTE — Progress Notes (Signed)
Patient ID: Catherine Navarro, female    DOB: [birth year], 60 y.o.   MRN: [id number]  PCP: Wynne Dust  Subjective:   Chief Complaint  Patient presents with  . Follow-up    6 mos  . Hypertension  . Hyperglycemia  . Medication Refill     Flonase  . lower back pain    only hurts when sitting, "it hasn't stopped me so far from doing anything".  . GERD    "better"    HPI Presents for evaluation of HTN. She is due for follow-up of several other issues as well.  She has been monitoring her BP at home with a wrist device which she notes all readings below 140/80. Recently she has "fallen off the wagon" in regards to her diet and exercise. She has been successful in the past with healthy eating and increased exercise and plans to resume making those intentional lifestyle modifications.  Her brother, Catherine Navarro, passed away from [place] on 10/27/14. Her oldest sister, Catherine Navarro, also died of colon cancer at age 34. Her last colonoscopy was 2012.  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 However, she seems to have high spirits and be handling both situations well.  Patient complained of having difficulty swallowing her Potassium pill in the morning, stating "it gets stuck in her throat". Would like to discuss if other forms are available.    Review of Systems Constitutional: Negative for fever.  HENT: Positive for dental problem (Jaw pain x 1 month that flares with chewing on upper left side). Negative for ear pain (Resolved) and sore throat. Sinus pressure: Chronic.  Eyes: Negative for pain and visual disturbance.  Respiratory: Negative for shortness of breath.  Cardiovascular: Negative for chest pain and palpitations.  Gastrointestinal: Negative for nausea, vomiting, diarrhea and constipation.  Endocrine: Positive for heat intolerance ("Hot spells"). Negative for polyuria.  Genitourinary: Negative for urgency, frequency and difficulty  urinating.  Musculoskeletal: Positive for back pain (chronic).     Patient Active Problem List   Diagnosis Date Noted  . Cutaneous skin tags 04/12/2014  . BMI 35.0-35.9,adult 08/01/2013  . 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 Family history of malignant neoplasm of gastrointestinal tract 12/22/2010  . Diverticulosis of colon (without mention of hemorrhage) 12/22/2010  . Benign neoplasm of colon 12/22/2010  . Fatty liver 11/27/2010  . Costochondral chest pain 11/27/2010  . GERD (gastroesophageal reflux disease) 11/27/2010  . HELICOBACTER PYLORI GASTRITIS, HX OF 05/02/2009  . ABDOMINAL PAIN-RUQ 07/12/2007  . Disorder of thyroid 03/23/2007  . HYPERTENSION 03/23/2007  . SINUSITIS 03/23/2007  . IBS 03/23/2007  . OSTEOARTHRITIS 03/23/2007  . LIVER FUNCTION TESTS, ABNORMAL, HX OF 03/23/2007  . PANCREATITIS, ACUTE, HX OF 03/23/2007  . GASTRITIS 12/21/2005  . HIATAL HERNIA 12/21/2005     Prior to Admission medications   Medication Sig Start Date End Date Taking? Authorizing Provider  aspirin 81 MG tablet Take 81 mg by mouth daily.     Yes Historical Provider, MD  Calcium Carbonate-Vitamin D (CALCIUM-D) 600-400 MG-UNIT TABS Take by mouth 2 (two) times daily.   Yes Historical Provider, MD  Cholecalciferol (VITAMIN D) 2000 UNITS CAPS Take by mouth.     Yes Historical Provider, MD  fish oil-omega-3 fatty acids 1000 MG capsule Take 1 g by mouth every other day.   Yes Historical Provider, MD  fluticasone (FLONASE) 50 MCG/ACT nasal spray Place 2 sprays into the nose daily. 07/26/12  Yes Harrison Mons, PA-C  Garlic [id number] MG TABS Take by mouth.     Yes Historical Provider, MD  loratadine (CLARITIN REDITABS) 10 MG dissolvable tablet Take 10 mg by mouth daily.     Yes Historical Provider, MD  losartan-hydrochlorothiazide (HYZAAR) 100-25 MG per tablet Take 1 tablet by mouth daily 08/07/14  Yes Barney Gertsch, PA-C  meloxicam (MOBIC) 15 MG tablet Take 1 tablet (15 mg total) by mouth daily. 08/07/14  Yes Rydge Texidor, PA-C  Multiple Vitamin (MULTIVITAMIN PO) Take by mouth 1 day or 1 dose.     Yes Historical Provider, MD  pantoprazole (PROTONIX) 40 MG tablet Take 1 tablet (40 mg total) by mouth 2 (two) times daily. 08/07/14  Yes Harvey Lingo, PA-C  potassium chloride SA (K-DUR,KLOR-CON) 20 MEQ tablet Take 1 tablet by mouth  daily 08/07/14  Yes Francisco Ostrovsky, PA-C  propranolol ER (INDERAL LA) 80 MG 24 hr capsule TAKE 1 CAPSULE BY MOUTH DAILY 08/07/14  Yes Muneeb Veras, PA-C  pyridOXINE (VITAMIN B-6) 100 MG tablet Take 200 mg by mouth daily.   Yes Historical Provider, MD  valACYclovir (VALTREX) 1000 MG tablet Take 2 tablets AS SOON as first symptoms of fever blister appear.  Take 2 tablets 12 hours later. 07/26/12  Yes Avonna Iribe, PA-C  vitamin B-12 (CYANOCOBALAMIN) 1000 MCG tablet Take 1,000 mcg by mouth daily.   Yes Historical Provider, MD  ondansetron (ZOFRAN-ODT) 8 MG disintegrating tablet Take 1 tablet (8 mg total) by mouth every 8 (eight) hours as needed for nausea. Patient not taking: Reported on 02/05/2015 09/25/14   Harrison Mons, PA-C     Allergies  Allergen Reactions  . Amoxicillin     rash  . Codeine     REACTION: headache       Objective:  Physical Exam  Constitutional: She is oriented to person, place, and time. Vital signs are normal. She appears well-developed and well-nourished. She is active and cooperative. No distress.  BP 134/76 mmHg  Pulse 62  Temp(Src) 98.5 F (36.9 C) (Oral)  Resp 16  Ht 5\' 5"  (1.651 m)  Wt 222 lb (100.699 kg)  BMI 36.94 kg/m2  SpO2 98%  HENT:  Head: Normocephalic and atraumatic.  Right Ear: Hearing normal.  Left Ear: Hearing normal.  Eyes: Conjunctivae, EOM and lids are normal. No scleral icterus. Right pupil is reactive. Right pupil not round: anisocoria. Left pupil is round and reactive.  Neck: Normal range of motion. Neck supple. No thyromegaly present.  Cardiovascular: Normal rate, regular rhythm and normal heart sounds.   Pulses:       Radial pulses are 2+ on the right side, and 2+ on the left side.  Pulmonary/Chest: Effort normal and breath sounds normal.  Lymphadenopathy:       Head (right side): No tonsillar, no preauricular, no posterior auricular and no occipital adenopathy present.       Head (left side): No tonsillar, no preauricular, no posterior auricular and no occipital adenopathy present.    She has no cervical adenopathy.       Right: No supraclavicular adenopathy present.       Left: No supraclavicular adenopathy present.  Neurological: She is alert and oriented to person, place, and time. No sensory deficit.  Skin: Skin is warm, dry and intact. No rash noted. No cyanosis or erythema. Nails show no clubbing.  Psychiatric: She has a normal mood and affect.           Assessment & Plan:   1. Essential hypertension Controlled. She will  ask her pharmacist about alternate K+ tablets that may be easier to swallow. In the meantime, try chin-tuck when swallowing this tablet. - Comprehensive metabolic panel  2. Hyperglycemia Await lab results. Start metformin if indicated. - POCT glucose (manual entry) - POCT glycosylated hemoglobin (Hb A1C)  3. Gastroesophageal reflux disease, esophagitis presence not specified Stable.  4. DDD (degenerative disc disease), thoracic Stable.  5. Need for shingles vaccine - zoster vaccine live, PF, (ZOSTAVAX) 96295 UNT/0.65ML injection; Inject 19,400 Units into the skin once.  Dispense: 0.65 mL; Refill: 0  6. Disorder of thyroid Needs updated labs and [place] for monitoring. - TSH - T3, Free - T4, Free - US Soft Tissue Head/Neck; Future  7. Benign neoplasm of colon, unspecified part of colon Due for repeat screening. - Ambulatory referral to Gastroenterology  8. BMI 36.0-36.9,adult Lifestyle changes planned.   Return in about 6 months (around 08/05/2015).    Fara Chute, PA-C Physician Assistant-Certified [facility]

## 2015-02-06 NOTE — Progress Notes (Signed)
Subjective:    Patient ID: Catherine Navarro, female    DOB: 09-18-1955, 60 y.o.   MRN: [id number] Chief Complaint  Patient presents with  . Follow-up    6 mos  . Hypertension  . Hyperglycemia  . Medication Refill     Flonase  . lower back pain    only hurts when sitting, "it hasn't stopped me so far from doing anything".  . GERD    "better"   HPI Patient presents today for a 6 month f/u of her chronic medical problems and medication refills. She has been monitoring her BP at home with a wrist device which she notes all readings below 140/80. Recently she has "fallen off the wagon" in regards to her diet and exercise. We discussed what worked for her in the past and she seems motivated to begin exercising and monitoring her diet again.  Her brother, Nadara Mustard, passed away from [place] on 10/27/14. 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 However, she seems to have high spirits and be handling both situations well. Patient complained of having difficulty swallowing her Potassium pill in the morning, stating "it gets stuck in her throat". Would like to discuss if other forms are available.   Review of Systems  Constitutional: Negative for fever.  HENT: Positive for dental problem (Jaw pain x 1 month that flares with chewing on upper left side). Negative for ear pain (Resolved) and sore throat. Sinus pressure: Ongoing.   Eyes: Negative for pain and visual disturbance.  Respiratory: Negative for shortness of breath.   Cardiovascular: Negative for chest pain and palpitations.  Gastrointestinal: Negative for nausea, vomiting, diarrhea and constipation.  Endocrine: Positive for heat intolerance ("Hot spells"). Negative for polyuria.  Genitourinary: Negative for urgency, frequency and difficulty urinating.  Musculoskeletal: Positive for back pain (chronic).       Objective:   Physical Exam  Constitutional: She is oriented to person, place, and  time. Vital signs are normal. She appears well-developed and well-nourished. No distress.  BP 134/76 mmHg  Pulse 62  Temp(Src) 98.5 F (36.9 C) (Oral)  Resp 16  Ht 5\' 5"  (1.651 m)  Wt 222 lb (100.699 kg)  BMI 36.94 kg/m2  SpO2 98%  HENT:  Head: Normocephalic and atraumatic.  Right Ear: Hearing, tympanic membrane, external ear and ear canal normal.  Left Ear: Hearing, tympanic membrane, external ear and ear canal normal.  Nose: Nose normal.  Mouth/Throat: Uvula is midline, oropharynx is clear and moist and mucous membranes are normal.  Eyes: Conjunctivae, EOM and lids are normal. Right eye exhibits no discharge. Left eye exhibits no discharge. Right pupil is not round (Enlarged with evidence of Anisocoria). Right pupil is reactive. Left pupil is round and reactive. Pupils are equal.  Fundoscopic exam:      The right eye shows no AV nicking, no exudate, no hemorrhage and no papilledema. The right eye shows red reflex.       The left eye shows no AV nicking, no exudate, no hemorrhage and no papilledema. The left eye shows red reflex.  Neck: Trachea normal. Neck supple. Thyromegaly: possible.  Cardiovascular: Normal rate, regular rhythm, S1 normal, S2 normal, normal heart sounds and normal pulses.  Exam reveals no gallop and no friction rub.   No murmur heard. Pulmonary/Chest: Effort normal and breath sounds normal.  Lymphadenopathy:    She has no cervical adenopathy.  Neurological: She is alert and oriented to person,  place, and time.  Skin: Skin is warm and dry. She is not diaphoretic.  Psychiatric: She has a normal mood and affect. Her behavior is normal.  Vitals reviewed.  Patient Active Problem List   Diagnosis Date Noted  . Cutaneous skin tags 04/12/2014  . BMI 36.0-36.9,adult 08/01/2013  . 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 Family history of malignant neoplasm of gastrointestinal tract 12/22/2010  . Diverticulosis of colon (without mention of hemorrhage) 12/22/2010  . Benign neoplasm  of colon 12/22/2010  . Fatty liver 11/27/2010  . Costochondral chest pain 11/27/2010  . GERD (gastroesophageal reflux disease) 11/27/2010  . HELICOBACTER PYLORI GASTRITIS, HX OF 05/02/2009  . ABDOMINAL PAIN-RUQ 07/12/2007  . Disorder of thyroid 03/23/2007  . HYPERTENSION 03/23/2007  . SINUSITIS 03/23/2007  . IBS 03/23/2007  . OSTEOARTHRITIS 03/23/2007  . LIVER FUNCTION TESTS, ABNORMAL, HX OF 03/23/2007  . PANCREATITIS, ACUTE, HX OF 03/23/2007  . GASTRITIS 12/21/2005  . HIATAL HERNIA 12/21/2005    Prior to Admission medications   Medication Sig Start Date End Date Taking? Authorizing Provider  aspirin 81 MG tablet Take 81 mg by mouth daily.     Yes Historical Provider, MD  Calcium Carbonate-Vitamin D (CALCIUM-D) 600-400 MG-UNIT TABS Take by mouth 2 (two) times daily.   Yes Historical Provider, MD  Cholecalciferol (VITAMIN D) 2000 UNITS CAPS Take by mouth.     Yes Historical Provider, MD  fish oil-omega-3 fatty acids 1000 MG capsule Take 1 g by mouth every other day.   Yes Historical Provider, MD  fluticasone (FLONASE) 50 MCG/ACT nasal spray Place 2 sprays into both nostrils daily. 02/05/15  Yes Chelle Jeffery, PA-C  Garlic [id number] MG TABS Take by mouth.     Yes Historical Provider, MD  loratadine (CLARITIN REDITABS) 10 MG dissolvable tablet Take 10 mg by mouth daily.     Yes Historical Provider, MD  losartan-hydrochlorothiazide (HYZAAR) 100-25 MG per tablet Take 1 tablet by mouth daily 08/07/14  Yes Chelle Jeffery, PA-C  meloxicam (MOBIC) 15 MG tablet Take 1 tablet (15 mg total) by mouth daily. 08/07/14  Yes Chelle Jeffery, PA-C  Multiple Vitamin (MULTIVITAMIN PO) Take by mouth 1 day or 1 dose.     Yes Historical Provider, MD  pantoprazole (PROTONIX) 40 MG tablet Take 1 tablet (40 mg total) by mouth 2 (two) times daily. 08/07/14  Yes Chelle Jeffery, PA-C  potassium chloride SA (K-DUR,KLOR-CON) 20 MEQ tablet Take 1 tablet by mouth  daily 08/07/14  Yes Chelle Jeffery, PA-C  propranolol ER (INDERAL  LA) 80 MG 24 hr capsule TAKE 1 CAPSULE BY MOUTH DAILY 08/07/14  Yes Chelle Jeffery, PA-C  pyridOXINE (VITAMIN B-6) 100 MG tablet Take 200 mg by mouth daily.   Yes Historical Provider, MD  valACYclovir (VALTREX) 1000 MG tablet Take 2 tablets AS SOON as first symptoms of fever blister appear.  Take 2 tablets 12 hours later. 07/26/12  Yes Chelle Jeffery, PA-C  vitamin B-12 (CYANOCOBALAMIN) 1000 MCG tablet Take 1,000 mcg by mouth daily.   Yes Historical Provider, MD  ondansetron (ZOFRAN-ODT) 8 MG disintegrating tablet Take 1 tablet (8 mg total) by mouth every 8 (eight) hours as needed for nausea. Patient not taking: Reported on 02/05/2015 09/25/14   Chelle Jeffery, PA-C  zoster vaccine live, PF, (ZOSTAVAX) 16109 UNT/0.65ML injection Inject 19,400 Units into the skin once. 02/05/15   Harrison Mons, PA-C    Allergies  Allergen Reactions  . Amoxicillin     rash  . Codeine     REACTION: headache  Assessment & Plan:  1. Essential hypertension - Comprehensive metabolic panel  2. Hyperglycemia - POCT glucose (manual entry) - POCT glycosylated hemoglobin (Hb A1C)  3. Gastroesophageal reflux disease, esophagitis presence not specified -Will discuss with the pharmacist the different options for Potassium. If calls back with a better option we will call in one that does not aggravate her.   4. DDD (degenerative disc disease), thoracic  5. Need for shingles vaccine - zoster vaccine live, PF, (ZOSTAVAX) 28413 UNT/0.65ML injection; Inject 19,400 Units into the skin once.  Dispense: 0.65 mL; Refill: 0  6. Disorder of thyroid -Discussed the need for testing as she is having hot flashes and hasn't received a [place] since 2012.  - TSH - T3, Free - T4, Free - US Soft Tissue Head/Neck; Future  7. Benign neoplasm of colon, unspecified part of colon - Ambulatory referral to Gastroenterology. Due for a colonoscopy.   8. BMI 36.0-36.9,adult -Discussed about starting up home exercises and monitoring diet  again.

## 2015-02-07 ENCOUNTER — Ambulatory Visit: Payer: Self-pay | Admitting: Physician Assistant

## 2015-03-14 ENCOUNTER — Ambulatory Visit
Admission: RE | Admit: 2015-03-14 | Discharge: 2015-03-14 | Disposition: A | Discharge status: Home / Self Care | Payer: 59 | Source: Ambulatory Visit | Attending: Physician Assistant | Admitting: Physician Assistant

## 2015-03-14 ENCOUNTER — Other Ambulatory Visit: Payer: Self-pay | Admitting: Physician Assistant

## 2015-03-14 DIAGNOSIS — E041 Nontoxic single thyroid nodule: Secondary | ICD-10-CM

## 2015-03-14 DIAGNOSIS — E079 Disorder of thyroid, unspecified: Secondary | ICD-10-CM

## 2015-03-19 NOTE — Addendum Note (Signed)
Addended by: Fara Chute on: 03/19/2015 09:39 AM   Modules accepted: Orders

## 2015-06-04 ENCOUNTER — Encounter: Payer: Self-pay | Admitting: Adult Health

## 2015-06-04 ENCOUNTER — Ambulatory Visit (INDEPENDENT_AMBULATORY_CARE_PROVIDER_SITE_OTHER): Payer: 59 | Admitting: Adult Health

## 2015-06-04 ENCOUNTER — Other Ambulatory Visit (HOSPITAL_COMMUNITY)
Admission: RE | Admit: 2015-06-04 | Discharge: 2015-06-04 | Disposition: A | Discharge status: Home / Self Care | Payer: 59 | Source: Ambulatory Visit | Attending: Adult Health | Admitting: Adult Health

## 2015-06-04 VITALS — BP 134/70 | HR 60 | Ht 64.0 in | Wt 233.5 lb

## 2015-06-04 DIAGNOSIS — Z1212 Encounter for screening for malignant neoplasm of rectum: Secondary | ICD-10-CM

## 2015-06-04 DIAGNOSIS — M62830 Muscle spasm of back: Secondary | ICD-10-CM

## 2015-06-04 DIAGNOSIS — Z1151 Encounter for screening for human papillomavirus (HPV): Secondary | ICD-10-CM | POA: Insufficient documentation

## 2015-06-04 DIAGNOSIS — Z01419 Encounter for gynecological examination (general) (routine) without abnormal findings: Secondary | ICD-10-CM

## 2015-06-04 DIAGNOSIS — Z800 Family history of malignant neoplasm of digestive organs: Secondary | ICD-10-CM

## 2015-06-04 HISTORY — DX: Muscle spasm of back: M62.830

## 2015-06-04 LAB — HEMOCCULT GUIAC POC 1CARD (OFFICE): FECAL OCCULT BLD: NEGATIVE

## 2015-06-04 MED ORDER — CYCLOBENZAPRINE HCL 5 MG PO TABS: 5.0000 mg | ORAL_TABLET | Freq: Three times a day (TID) | ORAL | Status: DC | PRN

## 2015-06-04 NOTE — Progress Notes (Signed)
Patient ID: ANIIYA TRACY, female   DOB: 08-Mar-1955, 60 y.o.   MRN: [id number] History of Present Illness: Myldred is a 60 year old white female in for well woman gyn exam and pap and is complaining of cramps or spasms in back.She is losing her job in August. PCP Joaquin Courts PA.   Current Medications, Allergies, Past Medical History, Past Surgical History, Family History and Social History were reviewed in [employer] record.     Review of Systems: Patient denies any headaches, hearing loss, fatigue, blurred vision, shortness of breath, chest pain, abdominal pain, problems with bowel movements, urination, or intercourse(not having sex). No joint pain or mood swings.See HPI for positives.    Physical Exam:BP 134/70 mmHg  Pulse 60  Ht 5\' 4"  (1.626 m)  Wt 233 lb 8 oz (105.915 kg)  BMI 40.06 kg/m2 General:  Well developed, well nourished, no acute distress Skin:  Warm and dry Neck:  Midline trachea, normal thyroid, good ROM, no lymphadenopathy Lungs; Clear to auscultation bilaterally Breast:  No dominant palpable mass, retraction, or nipple discharge Cardiovascular: Regular rate and rhythm Abdomen:  Soft, non tender, no hepatosplenomegaly Pelvic:  External genitalia is normal in appearance, no lesions.  The vagina is normal in appearance. Urethra has no lesions or masses. The cervix is smooth, pap with HPV performed.  Uterus is felt to be normal size, shape, and contour.  No adnexal masses or tenderness noted.Bladder is non tender, no masses felt. Rectal: Good sphincter tone, no polyps, or hemorrhoids felt.  Hemoccult negative. Extremities/musculoskeletal:  No swelling or varicosities noted, no clubbing or cyanosis Psych:  No mood changes, alert and cooperative,seems happy   Impression: Well woman gyn exam and pap Muscle spasm, back Family history of colon cancer     Plan: Rx flexeril 5 mg #30 take 1 tid prn with 1 refill Physical in 1 year, pap in 3  if normal Mammogram yearly Colonoscopy this year

## 2015-06-04 NOTE — Patient Instructions (Signed)
Physical in 1 year, pap in 3 years Mammogram yearly Colonoscopy this year Labs at PCP

## 2015-06-06 LAB — CYTOLOGY - PAP

## 2015-07-12 ENCOUNTER — Other Ambulatory Visit: Payer: Self-pay | Admitting: Physician Assistant

## 2015-08-06 ENCOUNTER — Encounter: Payer: Self-pay | Admitting: Physician Assistant

## 2015-08-06 ENCOUNTER — Ambulatory Visit (INDEPENDENT_AMBULATORY_CARE_PROVIDER_SITE_OTHER): Payer: BLUE CROSS/BLUE SHIELD | Admitting: Physician Assistant

## 2015-08-06 VITALS — BP 136/72 | HR 62 | Temp 97.9°F | Resp 16 | Ht 65.5 in | Wt 234.6 lb

## 2015-08-06 DIAGNOSIS — I10 Essential (primary) hypertension: Secondary | ICD-10-CM

## 2015-08-06 DIAGNOSIS — Z0000 Encounter for general adult medical examination without abnormal findings: Secondary | ICD-10-CM

## 2015-08-06 DIAGNOSIS — E079 Disorder of thyroid, unspecified: Secondary | ICD-10-CM | POA: Diagnosis not present

## 2015-08-06 DIAGNOSIS — R739 Hyperglycemia, unspecified: Secondary | ICD-10-CM

## 2015-08-06 LAB — CBC WITH DIFFERENTIAL/PLATELET
BASOS ABS: 52 {cells}/uL (ref 0–200)
Basophils Relative: 1 %
EOS ABS: 104 {cells}/uL (ref 15–500)
EOS PCT: 2 %
HCT: 39.7 % (ref 35.0–45.0)
Hemoglobin: 13.4 g/dL (ref 11.7–15.5)
LYMPHS ABS: 1768 {cells}/uL (ref 850–3900)
Lymphocytes Relative: 34 %
MCH: 28.8 pg (ref 27.0–33.0)
MCHC: 33.8 g/dL (ref 32.0–36.0)
MCV: 85.2 fL (ref 80.0–100.0)
MONO ABS: 364 {cells}/uL (ref 200–950)
MONOS PCT: 7 %
MPV: 9.2 fL (ref 7.5–12.5)
NEUTROS PCT: 56 %
Neutro Abs: 2912 cells/uL (ref 1500–7800)
PLATELETS: 211 10*3/uL (ref 140–400)
RBC: 4.66 MIL/uL (ref 3.80–5.10)
RDW: 13.2 % (ref 11.0–15.0)
WBC: 5.2 10*3/uL (ref 3.8–10.8)

## 2015-08-06 LAB — COMPREHENSIVE METABOLIC PANEL
ALBUMIN: 4.5 g/dL (ref 3.6–5.1)
ALT: 14 U/L (ref 6–29)
AST: 17 U/L (ref 10–35)
Alkaline Phosphatase: 87 U/L (ref 33–130)
BILIRUBIN TOTAL: 0.7 mg/dL (ref 0.2–1.2)
BUN: 15 mg/dL (ref 7–25)
CO2: 28 mmol/L (ref 20–31)
CREATININE: 0.73 mg/dL (ref 0.50–0.99)
Calcium: 9.9 mg/dL (ref 8.6–10.4)
Chloride: 102 mmol/L (ref 98–110)
Glucose, Bld: 97 mg/dL (ref 65–99)
Potassium: 3.5 mmol/L (ref 3.5–5.3)
SODIUM: 139 mmol/L (ref 135–146)
TOTAL PROTEIN: 7.2 g/dL (ref 6.1–8.1)

## 2015-08-06 LAB — LIPID PANEL
Cholesterol: 182 mg/dL (ref 125–200)
HDL: 72 mg/dL (ref >=46)
LDL Cholesterol: 89 mg/dL (ref <130)
Total CHOL/HDL Ratio: 2.5 Ratio (ref <=5.0)
Triglycerides: 106 mg/dL (ref <150)
VLDL: 21 mg/dL (ref <30)

## 2015-08-06 LAB — T3, FREE: T3 FREE: 2.8 pg/mL (ref 2.3–4.2)

## 2015-08-06 LAB — TSH: TSH: 0.72 m[IU]/L

## 2015-08-06 LAB — POCT GLYCOSYLATED HEMOGLOBIN (HGB A1C): HEMOGLOBIN A1C: 5.7

## 2015-08-06 LAB — T4, FREE: FREE T4: 1.4 ng/dL (ref 0.8–1.8)

## 2015-08-06 NOTE — Progress Notes (Signed)
Patient ID: JANAIJA PISANI, female    DOB: January 18, 1956, 60 y.o.   MRN: [id number]  PCP: Harrison Mons, PA-C  Chief Complaint  Patient presents with  . Annual Exam    no pap    Subjective:   HPI: Presents for Annual Exam.   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 Last day will be 09/06/2015. New insurance has a higher deductible and covers less. Insurance will be paid until severance runs out.  Did not have thyroid biopsy. She was never contacted to schedule it and doesn't think that she needs one. Previous dr told her she'd not need another biopsy.  Cervical Cancer Screening: 06/04/2015 with GYN Breast Cancer Screening: CBE 05/2015, mammogram 01/2015 Colorectal Cancer Screening: 11/2010, repeat 12/2015 Bone Density Testing: not yet a candidate HIV Screening: completed STI Screening: low risk Seasonal Influenza Vaccination: annually recommended Td/Tdap Vaccination: current. Repeat 2019 Pneumococcal Vaccination: not yet a candidate Zoster Vaccination: has prescription, but hasn't received the vaccine.    Patient Active Problem List   Diagnosis Date Noted  . Muscle spasm of back 06/04/2015  . Family history of colon cancer 06/04/2015  . Cutaneous skin tags 04/12/2014  . BMI 36.0-36.9,adult 08/01/2013  . 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 Family history of malignant neoplasm of gastrointestinal tract 12/22/2010  . Diverticulosis of colon (without mention of hemorrhage) 12/22/2010  . Benign neoplasm of colon 12/22/2010  . Fatty liver 11/27/2010  . Costochondral chest pain 11/27/2010  . GERD (gastroesophageal reflux disease) 11/27/2010  . HELICOBACTER PYLORI GASTRITIS, HX OF 05/02/2009  . ABDOMINAL PAIN-RUQ 07/12/2007  . Disorder of thyroid 03/23/2007  . Essential hypertension 03/23/2007  . SINUSITIS 03/23/2007  . IBS 03/23/2007  . OSTEOARTHRITIS 03/23/2007  . LIVER FUNCTION TESTS, ABNORMAL,  HX OF 03/23/2007  . PANCREATITIS, ACUTE, HX OF 03/23/2007  . GASTRITIS 12/21/2005  . HIATAL HERNIA 12/21/2005    Past Medical History  Diagnosis Date  . Hiatal hernia   . Gastritis   . Pancreatitis   . Sinusitis   . IBS (irritable bowel syndrome)   . GERD (gastroesophageal reflux disease)   . Hypothyroidism     with nodules  . Osteoarthritis   . HTN (hypertension)   . Anisocoria     right eye  . Family history of colon cancer   . Abdominal pain 11/27/2010  . Elevated LFTs 11/27/2010  . Fatty infiltration of liver   . Allergy   . Accessory skin tags 01/25/2013  . Colon polyps 12/22/2010    Tubular Adenoma and Hyperplastic Polyp  . Muscle spasm of back 06/04/2015     Prior to Admission medications   Medication Sig Start Date End Date Taking? Authorizing Provider  aspirin 81 MG tablet Take 81 mg by mouth daily.     Yes Historical Provider, MD  Calcium Carbonate-Vitamin D (CALCIUM-D) 600-400 MG-UNIT TABS Take by mouth 2 (two) times daily.   Yes Historical Provider, MD  Cholecalciferol (VITAMIN D) 2000 UNITS CAPS Take by mouth.     Yes Historical Provider, MD  fish oil-omega-3 fatty acids 1000 MG capsule Take 1 g by mouth every other day.   Yes Historical Provider, MD  fluticasone (FLONASE) 50 MCG/ACT nasal spray Place 2 sprays into both nostrils daily. 02/05/15  Yes Jeryn Bertoni, PA-C  Garlic [id number] MG TABS Take by mouth.     Yes Historical Provider, MD  loratadine (CLARITIN REDITABS) 10 MG dissolvable tablet  Take 10 mg by mouth daily.     Yes Historical Provider, MD  losartan-hydrochlorothiazide [facility]) 100-25 MG tablet Take 1 tablet by mouth  daily 07/13/15  Yes Draco Malczewski, PA-C  meloxicam (MOBIC) 15 MG tablet Take 1 tablet by mouth  daily 07/13/15  Yes Estellar Cadena, PA-C  Multiple Vitamin (MULTIVITAMIN PO) Take by mouth 1 day or 1 dose.     Yes Historical Provider, MD  pantoprazole (PROTONIX) 40 MG tablet Take 1 tablet by mouth two  times daily 07/13/15  Yes Alanee Ting,  PA-C  potassium chloride SA (K-DUR,KLOR-CON) 20 MEQ tablet Take 1 tablet by mouth  daily 07/13/15  Yes Ihsan Nomura, PA-C  propranolol ER (INDERAL LA) 80 MG 24 hr capsule Take 1 capsule by mouth  daily 07/13/15  Yes Mandy Peeks, PA-C  pyridOXINE (VITAMIN B-6) 100 MG tablet Take 200 mg by mouth daily.   Yes Historical Provider, MD  vitamin B-12 (CYANOCOBALAMIN) 1000 MCG tablet Take 1,000 mcg by mouth daily.   Yes Historical Provider, MD  cyclobenzaprine (FLEXERIL) 5 MG tablet Take 1 tablet (5 mg total) by mouth 3 (three) times daily as needed for muscle spasms. Patient not taking: Reported on 08/06/2015 06/04/15   Estill Dooms, NP  valACYclovir (VALTREX) 1000 MG tablet Take 2 tablets AS SOON as first symptoms of fever blister appear.  Take 2 tablets 12 hours later. Patient not taking: Reported on 06/04/2015 07/26/12   Harrison Mons, PA-C  zoster vaccine live, PF, (ZOSTAVAX) 96295 UNT/0.65ML injection Inject 19,400 Units into the skin once. Patient not taking: Reported on 06/04/2015 02/05/15   Harrison Mons, PA-C    Allergies  Allergen Reactions  . Amoxicillin     rash  . Codeine     REACTION: headache    Past Surgical History  Procedure Laterality Date  . Tubal ligation    . Patella reconstruction      pins  . Cholecystectomy    . Anal fistulectomy    . Eye surgery    . Colonoscopy w/ biopsies    . Esophagogastroduodenoscopy      Family History  Problem Relation Age of Onset  . Breast cancer Maternal Aunt   . Diabetes Mother   . Irritable bowel syndrome Mother   . Stroke Mother   . Heart attack Paternal Grandfather   . COPD Father     emphysema  . Cancer Father   . Alzheimer's disease Paternal Grandmother   . Alzheimer's disease Maternal Aunt   . Cancer Sister     colorectal and liver  . Cancer Brother 50    colon  . Hyperlipidemia Sister   . Hypertension Sister   . Diabetes Maternal Grandmother   . Heart disease Neg Hx   . Hyperlipidemia Brother      triglycerides  . Hypertension Brother   . Colon polyps Brother   . Diabetes Sister   . Liver disease Sister     fatty liver, cirrhosis  . Heart attack Son 80    rare genetic condition    Social History   Social History  . Marital Status: Divorced    Spouse Name: BF: Laddie Aquas  . Number of Children: 3  . Years of Education: 12+   Occupational History  . IT Support Other   Social History Main Topics  . Smoking status: Former Smoker    Types: Cigarettes    Quit date: 01/26/1998  . Smokeless tobacco: Never Used  . Alcohol Use: No  . Drug  Use: No  . Sexual Activity: No   Other Topics Concern  . None   Social History Narrative   Lives with her boyfriend, Laddie Aquas.   Her ex-husband died of heart attack at age 31.    One son lives next door, one in [place], [place] and another in [place].       Review of Systems  Constitutional: Negative.   HENT: Negative.   Eyes: Negative.   Respiratory: Negative.   Cardiovascular: Negative.   Gastrointestinal: Negative.   Endocrine: Negative.   Genitourinary: Negative.   Musculoskeletal: Negative.   Skin: Negative.   Allergic/Immunologic: Negative.   Neurological: Negative.   Hematological: Negative.   Psychiatric/Behavioral: Negative.         Objective:  Physical Exam  Constitutional: She is oriented to person, place, and time. Vital signs are normal. She appears well-developed and well-nourished. She is active and cooperative. No distress.  BP 136/72 mmHg  Pulse 62  Temp(Src) 97.9 F (36.6 C) (Oral)  Resp 16  Ht 5' 5.5" (1.664 m)  Wt 234 lb 9.6 oz (106.414 kg)  BMI 38.43 kg/m2  SpO2 98% Wt Readings from Last 3 Encounters: 08/06/15 : 234 lb 9.6 oz (106.414 kg) 06/04/15 : 233 lb 8 oz (105.915 kg) 02/05/15 : 222 lb (100.699 kg)     HENT:  Head: Normocephalic and atraumatic.  Right Ear: Hearing, tympanic membrane, external ear and ear canal normal. No foreign bodies.  Left Ear: Hearing, tympanic membrane,  external ear and ear canal normal. No foreign bodies.  Nose: Nose normal.  Mouth/Throat: Uvula is midline, oropharynx is clear and moist and mucous membranes are normal. No oral lesions. Normal dentition. No dental abscesses or uvula swelling. No oropharyngeal exudate.  Eyes: Conjunctivae, EOM and lids are normal. Right eye exhibits no discharge. Left eye exhibits no discharge. No scleral icterus. Right pupil is not round (baseline anisocoria).  Fundoscopic exam:      The right eye shows no arteriolar narrowing, no AV nicking, no exudate, no hemorrhage and no papilledema. The right eye shows red reflex.       The left eye shows no arteriolar narrowing, no AV nicking, no exudate, no hemorrhage and no papilledema. The left eye shows red reflex.  Neck: Trachea normal, normal range of motion and full passive range of motion without pain. Neck supple. No spinous process tenderness and no muscular tenderness present. No thyroid mass and no thyromegaly present.  Cardiovascular: Normal rate, regular rhythm, normal heart sounds, intact distal pulses and normal pulses.   Pulmonary/Chest: Effort normal and breath sounds normal.  Musculoskeletal: She exhibits no edema or tenderness.       Cervical back: Normal.       Thoracic back: Normal.       Lumbar back: Normal.  Lymphadenopathy:       Head (right side): No tonsillar, no preauricular, no posterior auricular and no occipital adenopathy present.       Head (left side): No tonsillar, no preauricular, no posterior auricular and no occipital adenopathy present.    She has no cervical adenopathy.       Right: No supraclavicular adenopathy present.       Left: No supraclavicular adenopathy present.  Neurological: She is alert and oriented to person, place, and time. She has normal strength and normal reflexes. No cranial nerve deficit. She exhibits normal muscle tone. Coordination and gait normal.  Skin: Skin is warm, dry and intact. No rash noted. She is not  diaphoretic.  No cyanosis or erythema. Nails show no clubbing.  Psychiatric: She has a normal mood and affect. Her speech is normal and behavior is normal. Judgment and thought content normal.    Results for orders placed or performed in visit on 08/06/15  POCT glycosylated hemoglobin (Hb A1C)  Result Value Ref Range   Hemoglobin A1C 5.7           Assessment & Plan:  1. Annual physical exam Age appropriate anticipatory guidance provided.  2. Essential hypertension Controlled. Continue current regimen. - CBC with Differential/Platelet - TSH - Comprehensive metabolic panel  3. Disorder of thyroid Update labs. I will pull her paper record and see if we can find the pathology report from previous biopsy (2012). I will then ask the radiologist if the repeat biopsy is still recommended. - TSH - T4, Free - T3, Free  4. Hyperglycemia Await labs. Continue working on healthy eating and regular exercise. Her weight is up about 12 pounds as she has stopped counting her calories. - Lipid panel - Comprehensive metabolic panel - POCT glycosylated hemoglobin (Hb A1C)   Fara Chute, PA-C Physician Assistant-Certified [facility]

## 2015-08-06 NOTE — Patient Instructions (Addendum)
I will pull your paper record to check on the last thyroid biopsy. Take the shingles vaccine prescription to the pharmacy.    IF you received an x-ray today, you will receive an invoice from [facility]. Please contact [facility] at [phone] with questions or concerns regarding your invoice.   IF you received labwork today, you will receive an invoice from [employer]. Please contact [facility] at [phone] with questions or concerns regarding your invoice.   Our billing staff will not be able to assist you with questions regarding bills from these companies.  You will be contacted with the lab results as soon as they are available. The fastest way to get your results is to activate your My Chart account. Instructions are located on the last page of this paperwork. If you have not heard from [place] regarding the results in 2 weeks, please contact this office.    Keeping You Healthy  Get These Tests  Blood Pressure- Have your blood pressure checked by your healthcare provider at least once a year.  Normal blood pressure is 120/80.  Weight- Have your body mass index (BMI) calculated to screen for obesity.  BMI is a measure of body fat based on height and weight.  You can calculate your own BMI at https://www.west-esparza.com/  Cholesterol- Have your cholesterol checked every year.  Diabetes- Have your blood sugar checked every year if you have high blood pressure, high cholesterol, a family history of diabetes or if you are overweight.  Pap Test - Have a pap test every 1 to 5 years if you have been sexually active.  If you are older than 65 and recent pap tests have been normal you may not need additional pap tests.  In addition, if you have had a hysterectomy  for benign disease additional pap tests are not necessary.  Mammogram-Yearly mammograms are essential for early detection of breast cancer  Screening for Colon Cancer- Colonoscopy starting  at age 46. Screening may begin sooner depending on your family history and other health conditions.  Follow up colonoscopy as directed by your Gastroenterologist.  Screening for Osteoporosis- Screening begins at age 65 with bone density scanning, sooner if you are at higher risk for developing Osteoporosis.  Get these medicines  Calcium with Vitamin D- Your body requires 1200-1500 mg of Calcium a day and [phone] IU of Vitamin D a day.  You can only absorb 500 mg of Calcium at a time therefore Calcium must be taken in 2 or 3 separate doses throughout the day.  Hormones- Hormone therapy has been associated with increased risk for certain cancers and heart disease.  Talk to your healthcare provider about if you need relief from menopausal symptoms.  Aspirin- Ask your healthcare provider about taking Aspirin to prevent Heart Disease and Stroke.  Get these Immuniztions  Flu shot- Every fall  Pneumonia shot- Once after the age of 2; if you are younger ask your healthcare provider if you need a pneumonia shot.  Tetanus- Every ten years.  Zostavax- Once after the age of 52 to prevent shingles.  Take these steps  Don't smoke- Your healthcare provider can help you quit. For tips on how to quit, ask your healthcare provider or go to www.smokefree.gov or call 1-800 QUIT-NOW.  Be physically active- Exercise 5 days a week for a minimum of 30 minutes.  If you are not already physically active, start slow and gradually work up to 30 minutes of moderate physical activity.  Try walking,  dancing, bike riding, swimming, etc.  Eat a healthy diet- Eat a variety of healthy foods such as fruits, vegetables, whole grains, low fat milk, low fat cheeses, yogurt, lean meats, chicken, fish, eggs, dried beans, tofu, etc.  For more information go to www.thenutritionsource.org  Dental visit- Brush and floss teeth twice daily; visit your dentist twice a year.  Eye exam- Visit your Optometrist or Ophthalmologist  yearly.  Drink alcohol in moderation- Limit alcohol intake to one drink or less a day.  Never drink and drive.  Depression- Your emotional health is as important as your physical health.  If you're feeling down or losing interest in things you normally enjoy, please talk to your healthcare provider.  Seat Belts- can save your life; always wear one  Smoke/Carbon Monoxide detectors- These detectors need to be installed on the appropriate level of your home.  Replace batteries at least once a year.  Violence- If anyone is threatening or hurting you, please tell your healthcare provider.  Living Will/ Health care power of attorney- Discuss with your healthcare provider and family.

## 2015-08-15 ENCOUNTER — Other Ambulatory Visit: Payer: Self-pay

## 2015-08-15 MED ORDER — PROPRANOLOL HCL ER 80 MG PO CP24: ORAL_CAPSULE | ORAL | Status: DC

## 2015-08-15 MED ORDER — LOSARTAN POTASSIUM-HCTZ 100-25 MG PO TABS: ORAL_TABLET | ORAL | Status: DC

## 2015-08-15 MED ORDER — POTASSIUM CHLORIDE CRYS ER 20 MEQ PO TBCR: EXTENDED_RELEASE_TABLET | ORAL | Status: DC

## 2015-08-15 MED ORDER — PANTOPRAZOLE SODIUM 40 MG PO TBEC: DELAYED_RELEASE_TABLET | ORAL | Status: DC

## 2015-08-15 MED ORDER — MELOXICAM 15 MG PO TABS: ORAL_TABLET | ORAL | Status: DC

## 2015-08-15 NOTE — Telephone Encounter (Signed)
meds had to be refilled under collaborating MD; Inderal LA 80mg , Pantoprazole 40mg , Hyzaar 100-25, Meloxicam 15mg , Kdur 28meq sent under Brigitte Pulse

## 2015-09-10 ENCOUNTER — Encounter: Payer: Self-pay | Admitting: *Deleted

## 2015-10-07 ENCOUNTER — Encounter: Payer: Self-pay | Admitting: Internal Medicine

## 2015-10-16 ENCOUNTER — Encounter: Payer: Self-pay | Admitting: Internal Medicine

## 2015-10-29 ENCOUNTER — Encounter: Payer: Self-pay | Admitting: Physician Assistant

## 2015-10-29 ENCOUNTER — Other Ambulatory Visit: Payer: Self-pay | Admitting: Family Medicine

## 2015-10-30 MED ORDER — POTASSIUM CHLORIDE CRYS ER 20 MEQ PO TBCR
EXTENDED_RELEASE_TABLET | ORAL | 3 refills | Status: DC
Start: 2015-10-30 — End: 2016-12-30

## 2015-10-30 MED ORDER — LOSARTAN POTASSIUM-HCTZ 100-25 MG PO TABS: ORAL_TABLET | ORAL | 3 refills | Status: DC

## 2015-10-30 MED ORDER — PROPRANOLOL HCL ER 80 MG PO CP24: ORAL_CAPSULE | ORAL | 3 refills | Status: DC

## 2015-10-30 MED ORDER — MELOXICAM 15 MG PO TABS: ORAL_TABLET | ORAL | 3 refills | Status: DC

## 2015-10-30 MED ORDER — PANTOPRAZOLE SODIUM 40 MG PO TBEC: DELAYED_RELEASE_TABLET | ORAL | 3 refills | Status: DC

## 2015-10-30 NOTE — Telephone Encounter (Signed)
017 last exam and labs

## 2015-10-30 NOTE — Telephone Encounter (Signed)
Meds ordered this encounter  Medications  . propranolol ER (INDERAL LA) 80 MG 24 hr capsule    Sig: Take 1 capsule by mouth  daily    Dispense:  90 capsule    Refill:  3  . pantoprazole (PROTONIX) 40 MG tablet    Sig: Take 1 tablet by mouth two  times daily    Dispense:  180 tablet    Refill:  3  . losartan-hydrochlorothiazide (HYZAAR) 100-25 MG tablet    Sig: Take 1 tablet by mouth  daily    Dispense:  90 tablet    Refill:  3  . meloxicam (MOBIC) 15 MG tablet    Sig: Take 1 tablet by mouth  daily    Dispense:  90 tablet    Refill:  3  . potassium chloride SA (K-DUR,KLOR-CON) 20 MEQ tablet    Sig: Take 1 tablet by mouth  daily    Dispense:  90 tablet    Refill:  3

## 2015-11-19 MED ORDER — MELOXICAM 15 MG PO TABS: ORAL_TABLET | ORAL | 3 refills | Status: DC

## 2015-11-19 MED ORDER — LOSARTAN POTASSIUM-HCTZ 100-25 MG PO TABS: ORAL_TABLET | ORAL | 3 refills | Status: DC

## 2015-11-19 MED ORDER — PANTOPRAZOLE SODIUM 40 MG PO TBEC: DELAYED_RELEASE_TABLET | ORAL | 3 refills | Status: DC

## 2015-11-19 NOTE — Telephone Encounter (Signed)
[facility] mail order contacted [place] as her new pharmacy, not [facility]. Confirmed with patient. Will refill all meds to [facility] mail.

## 2015-11-28 ENCOUNTER — Ambulatory Visit (AMBULATORY_SURGERY_CENTER): Payer: Self-pay | Admitting: *Deleted

## 2015-11-28 VITALS — Ht 65.5 in | Wt 242.0 lb

## 2015-11-28 DIAGNOSIS — Z86010 Personal history of colonic polyps: Secondary | ICD-10-CM

## 2015-11-28 DIAGNOSIS — Z800 Family history of malignant neoplasm of digestive organs: Secondary | ICD-10-CM

## 2015-11-28 MED ORDER — NA SULFATE-K SULFATE-MG SULF 17.5-3.13-1.6 GM/177ML PO SOLN: 1.0000 | Freq: Once | ORAL | 0 refills | Status: AC

## 2015-11-28 NOTE — Progress Notes (Signed)
No egg or soy allergy known to patient  No issues with past sedation with any surgeries  or procedures, no intubation problems - pt states hard to wake post op  No diet pills per patient No home 02 use per patient  No blood thinners per patient  Pt denies issues with constipation  No A fib or A flutter

## 2015-12-10 ENCOUNTER — Encounter: Payer: Self-pay | Admitting: Internal Medicine

## 2015-12-23 ENCOUNTER — Encounter: Payer: Self-pay | Admitting: Internal Medicine

## 2015-12-23 ENCOUNTER — Ambulatory Visit (AMBULATORY_SURGERY_CENTER): Payer: BLUE CROSS/BLUE SHIELD | Admitting: Internal Medicine

## 2015-12-23 VITALS — BP 132/54 | HR 60 | Temp 96.8°F | Resp 15 | Ht 65.5 in | Wt 242.0 lb

## 2015-12-23 DIAGNOSIS — Z86010 Personal history of colonic polyps: Secondary | ICD-10-CM

## 2015-12-23 DIAGNOSIS — D123 Benign neoplasm of transverse colon: Secondary | ICD-10-CM | POA: Diagnosis not present

## 2015-12-23 DIAGNOSIS — D125 Benign neoplasm of sigmoid colon: Secondary | ICD-10-CM

## 2015-12-23 DIAGNOSIS — Z800 Family history of malignant neoplasm of digestive organs: Secondary | ICD-10-CM

## 2015-12-23 MED ORDER — SODIUM CHLORIDE 0.9 % IV SOLN: 500.0000 mL | INTRAVENOUS | Status: DC

## 2015-12-23 NOTE — Patient Instructions (Signed)
Impression/Recommendations:  Polyp handout given to patient. Diverticulosis handout given to patient. Hemorrhoid handout given to patient.  Repeat colonoscopy for surveillance based on pathology results.  YOU HAD AN ENDOSCOPIC PROCEDURE TODAY AT [facility]:   Refer to the procedure report that was given to you for any specific questions about what was found during the examination.  If the procedure report does not answer your questions, please call your gastroenterologist to clarify.  If you requested that your care partner not be given the details of your procedure findings, then the procedure report has been included in a sealed envelope for you to review at your convenience later.  YOU SHOULD EXPECT: Some feelings of bloating in the abdomen. Passage of more gas than usual.  Walking can help get rid of the air that was put into your GI tract during the procedure and reduce the bloating. If you had a lower endoscopy (such as a colonoscopy or flexible sigmoidoscopy) you may notice spotting of blood in your stool or on the toilet paper. If you underwent a bowel prep for your procedure, you may not have a normal bowel movement for a few days.  Please Note:  You might notice some irritation and congestion in your nose or some drainage.  This is from the oxygen used during your procedure.  There is no need for concern and it should clear up in a day or so.  SYMPTOMS TO REPORT IMMEDIATELY:   Following lower endoscopy (colonoscopy or flexible sigmoidoscopy):  Excessive amounts of blood in the stool  Significant tenderness or worsening of abdominal pains  Swelling of the abdomen that is new, acute  Fever of 100F or higher For urgent or emergent issues, a gastroenterologist can be reached at any hour by calling [phone].   DIET:  We do recommend a small meal at first, but then you may proceed to your regular diet.  Drink plenty of fluids but you should avoid alcoholic  beverages for 24 hours.  ACTIVITY:  You should plan to take it easy for the rest of today and you should NOT DRIVE or use heavy machinery until tomorrow (because of the sedation medicines used during the test).    FOLLOW UP: Our staff will call the number listed on your records the next business day following your procedure to check on you and address any questions or concerns that you may have regarding the information given to you following your procedure. If we do not reach you, we will leave a message.  However, if you are feeling well and you are not experiencing any problems, there is no need to return our call.  We will assume that you have returned to your regular daily activities without incident.  If any biopsies were taken you will be contacted by phone or by letter within the next 1-3 weeks.  Please call us at [phone] if you have not heard about the biopsies in 3 weeks.    SIGNATURES/CONFIDENTIALITY: You and/or your care partner have signed paperwork which will be entered into your electronic medical record.  These signatures attest to the fact that that the information above on your After Visit Summary has been reviewed and is understood.  Full responsibility of the confidentiality of this discharge information lies with you and/or your care-partner.

## 2015-12-23 NOTE — Progress Notes (Signed)
appro 1124  Pt coughing and more than usual drool looking fluid at oral cavity.  Pt head dropped and suctioned.  Sedation haulted until pt more awake and could swallow on command.  Sedation then continued lightly.  No fluid made it to suction cannister

## 2015-12-23 NOTE — Op Note (Signed)
[place] Patient Name: Catherine Navarro Procedure Date: 12/23/2015 11:00 AM MRN: [id number] Endoscopist: Jerene Bears , MD Age: 60 Referring MD:  Date of Birth: [birth year] Gender: Female Account #: [id number] Procedure:                Colonoscopy Indications:              Surveillance: Personal history of adenomatous                            polyps on last colonoscopy 5 years ago, Family                            history of colon cancer in multiple first-degree                            relatives (brother and sister) Medicines:                Monitored Anesthesia Care Procedure:                Pre-Anesthesia Assessment:                           - Prior to the procedure, a History and Physical                            was performed, and patient medications and                            allergies were reviewed. The patient's tolerance of                            previous anesthesia was also reviewed. The risks                            and benefits of the procedure and the sedation                            options and risks were discussed with the patient.                            All questions were answered, and informed consent                            was obtained. Prior Anticoagulants: The patient has                            taken no previous anticoagulant or antiplatelet                            agents. ASA Grade Assessment: III - A patient with                            severe systemic disease. After reviewing the risks  and benefits, the patient was deemed in                            satisfactory condition to undergo the procedure.                           After obtaining informed consent, the colonoscope                            was passed under direct vision. Throughout the                            procedure, the patient's blood pressure, pulse, and                            oxygen saturations were monitored  continuously. The                            Model PCF-[id number]) scope was introduced                            through the anus and advanced to the the cecum,                            identified by appendiceal orifice and ileocecal                            valve. The colonoscopy was performed without                            difficulty. The patient tolerated the procedure                            well. The quality of the bowel preparation was                            good. The ileocecal valve, appendiceal orifice, and                            rectum were photographed. Scope In: [id number] AM Scope Out: [id number] AM Scope Withdrawal Time: 0 hours 11 minutes 32 seconds  Total Procedure Duration: 0 hours 21 minutes 11 seconds  Findings:                 The digital rectal exam was normal.                           There was a medium-sized lipoma, 15 mm in diameter,                            in the ascending colon.                           Two sessile polyps were found in the transverse  colon. The polyps were 3 to 4 mm in size. These                            polyps were removed with a cold snare. Resection                            and retrieval were complete.                           A 5 mm polyp was found in the distal sigmoid colon.                            The polyp was sessile. The polyp was removed with a                            cold snare. Resection and retrieval were complete.                           Multiple small-mouthed diverticula were found in                            the sigmoid colon and descending colon.                           Internal hemorrhoids were found during                            retroflexion. The hemorrhoids were small. Complications:            No immediate complications. Estimated Blood Loss:     Estimated blood loss was minimal. Impression:               - Medium-sized lipoma in the ascending  colon.                           - Two 3 to 4 mm polyps in the transverse colon,                            removed with a cold snare. Resected and retrieved.                           - One 5 mm polyp in the distal sigmoid colon,                            removed with a cold snare. Resected and retrieved.                           - Moderate diverticulosis in the sigmoid colon and                            in the descending colon.                           - Internal hemorrhoids.  Recommendation:           - Patient has a contact number available for                            emergencies. The signs and symptoms of potential                            delayed complications were discussed with the                            patient. Return to normal activities tomorrow.                            Written discharge instructions were provided to the                            patient.                           - Resume previous diet.                           - Continue present medications.                           - Await pathology results.                           - Repeat colonoscopy is recommended for                            surveillance. The colonoscopy date will be                            determined after pathology results from today's                            exam become available for review. Jerene Bears, MD 12/23/2015 11:41:28 AM This report has been signed electronically.

## 2015-12-23 NOTE — Progress Notes (Signed)
Called to room to assist during endoscopic procedure.  Patient ID and intended procedure confirmed with present staff. Received instructions for my participation in the procedure from the performing physician.  

## 2015-12-23 NOTE — Progress Notes (Signed)
Report to PACU, RN, vss, BBS= Clear.  

## 2015-12-24 ENCOUNTER — Telehealth: Payer: Self-pay | Admitting: *Deleted

## 2015-12-24 NOTE — Telephone Encounter (Signed)
  Follow up Call-  Call back number 12/23/2015  Post procedure Call Back phone  # [phone] cell  Permission to leave phone message Yes  Some recent data might be hidden     Patient questions:  Do you have a fever, pain , or abdominal swelling? No. Pain Score  0 *  Have you tolerated food without any problems? Yes.    Have you been able to return to your normal activities? Yes.    Do you have any questions about your discharge instructions: Diet   No. Medications  No. Follow up visit  No.  Do you have questions or concerns about your Care? No.  Actions: * If pain score is 4 or above: No action needed, pain <4.

## 2016-01-03 ENCOUNTER — Encounter: Payer: Self-pay | Admitting: Internal Medicine

## 2016-01-09 ENCOUNTER — Encounter: Payer: Self-pay | Admitting: Physician Assistant

## 2016-04-14 IMAGING — US US SOFT TISSUE HEAD/NECK
1 series · 13 of 25 positions shown · non-contrast
Comparison: 01/22/2010

CLINICAL DATA: Follow-up nodules

EXAM:
THYROID ULTRASOUND
TECHNIQUE: Ultrasound examination of the thyroid gland and adjacent soft
tissues was performed.

[Series 1: us soft tissue head/neck · 0.06mm/px · 13 of 49 slices shown]
[im 1/49]
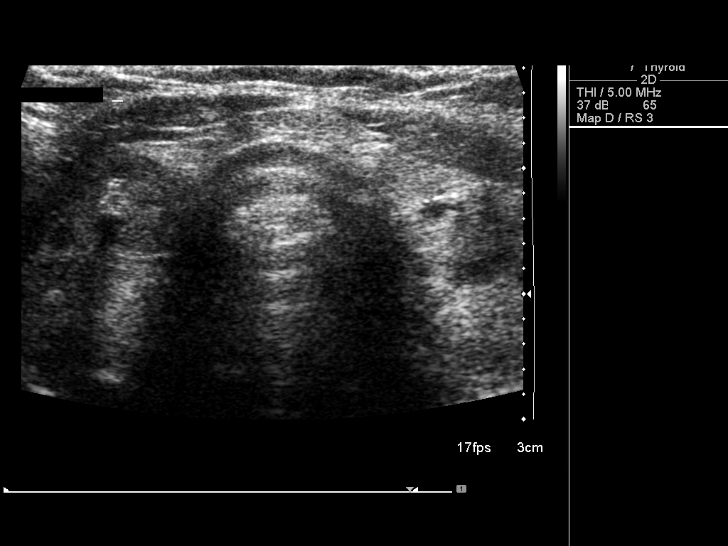
[im 5/49]
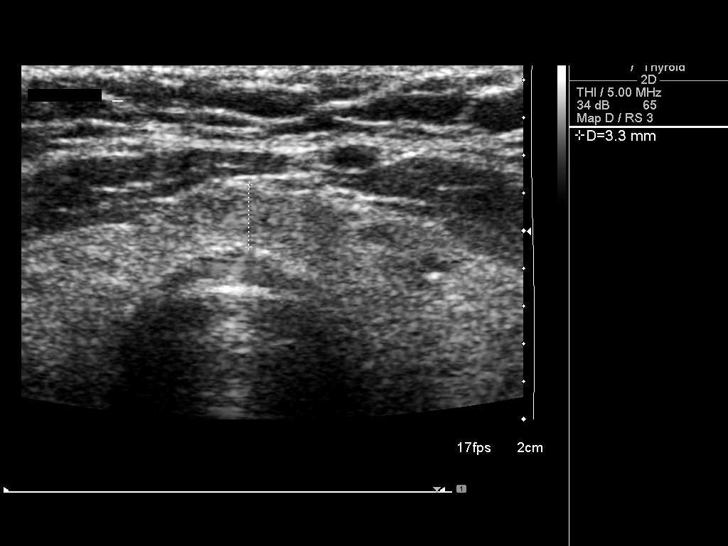
[im 9/49]
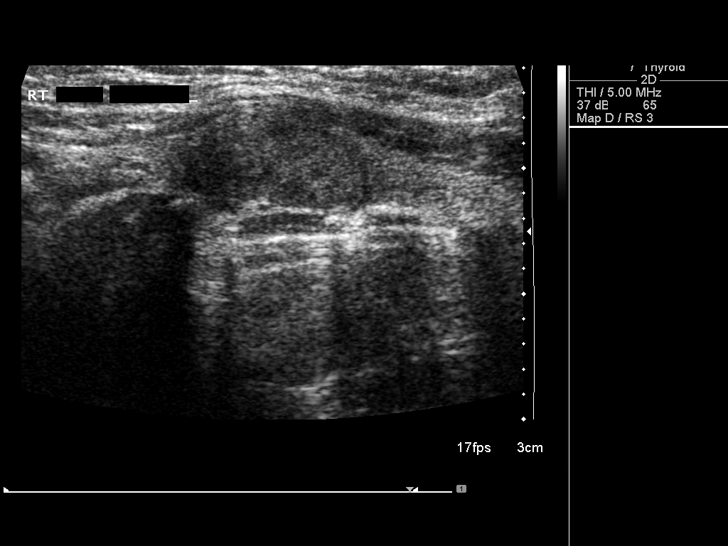
[im 13/49]
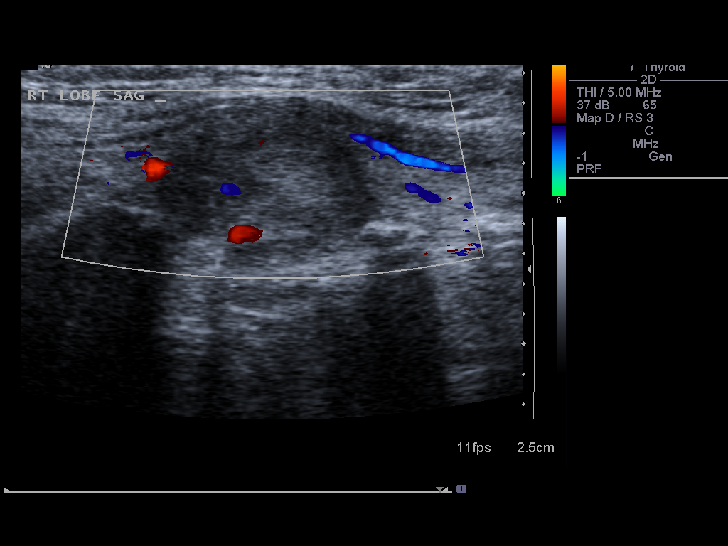
[im 17/49]
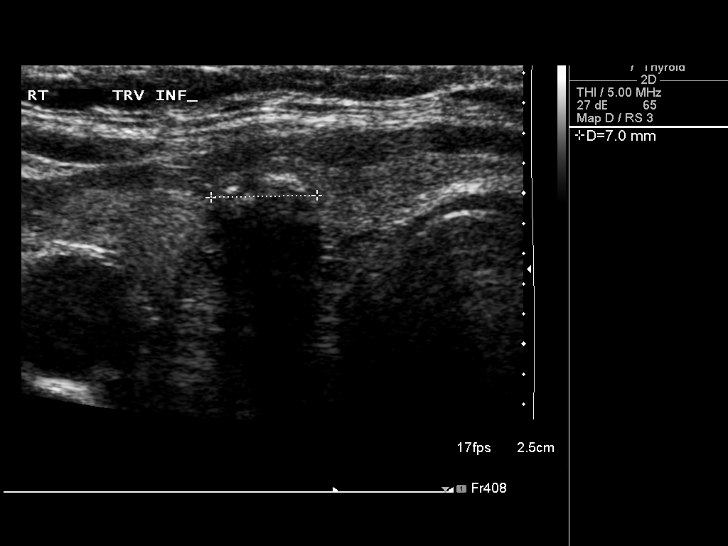
[im 21/49]
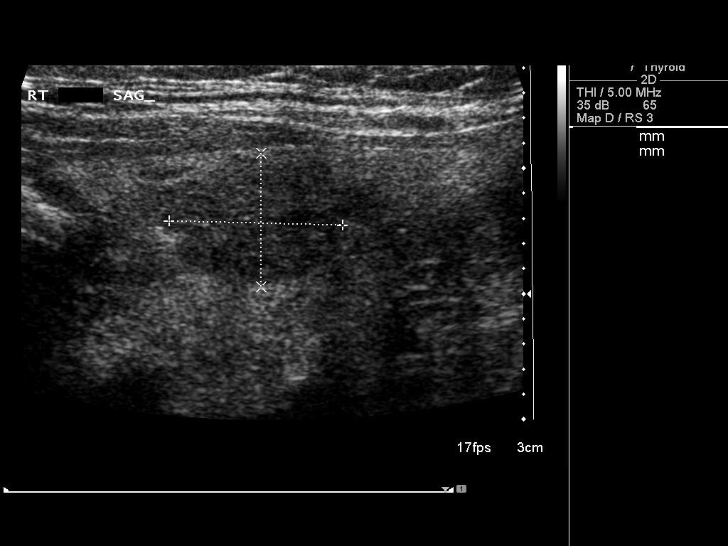
[im 25/49]
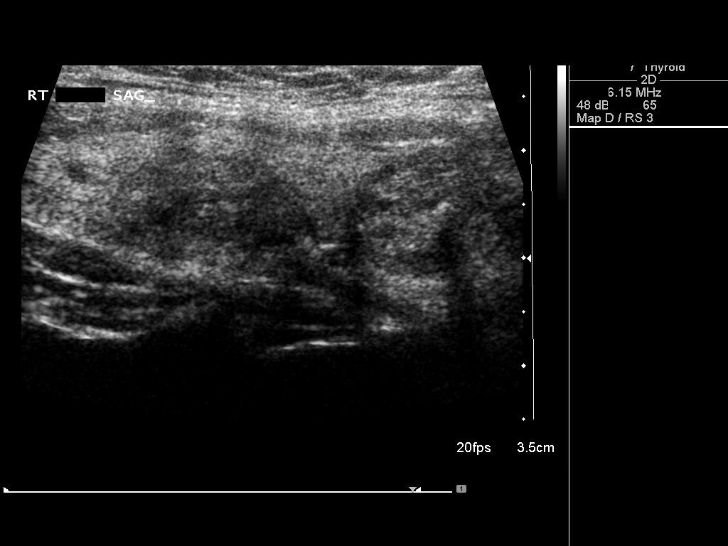
[im 29/49]
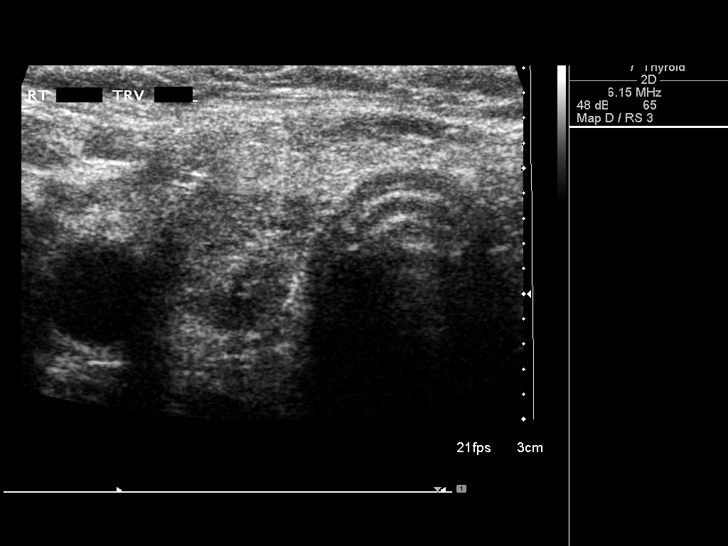
[im 33/49]
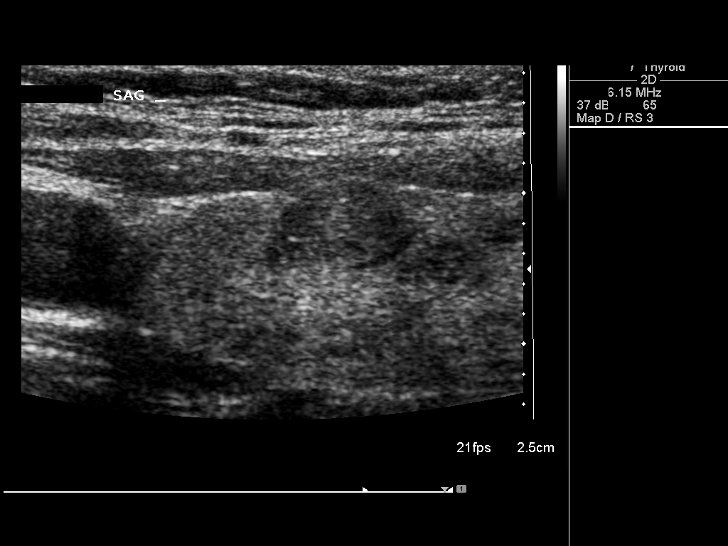
[im 37/49]
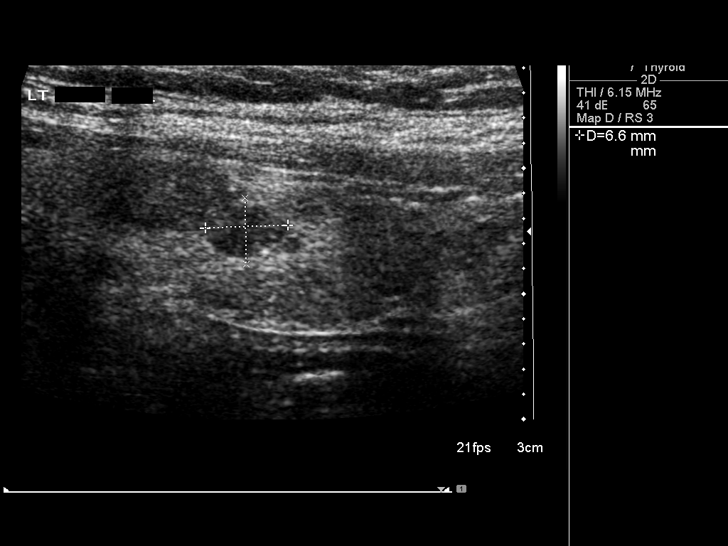
[im 41/49]
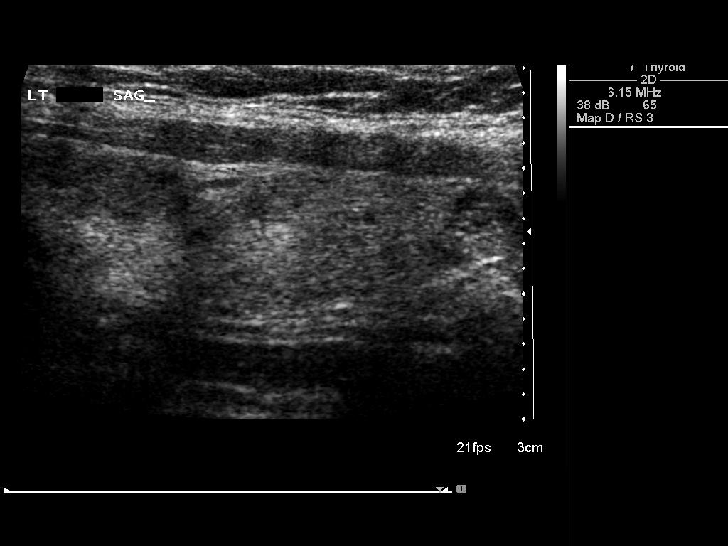
[im 45/49]
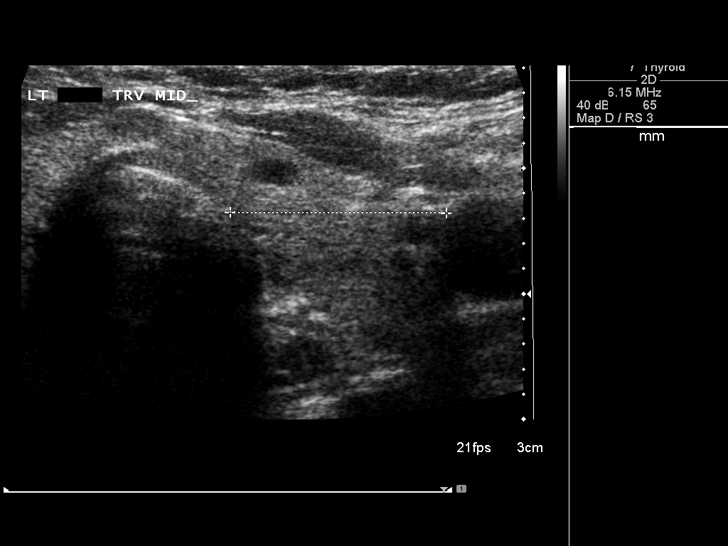
[im 49/49]
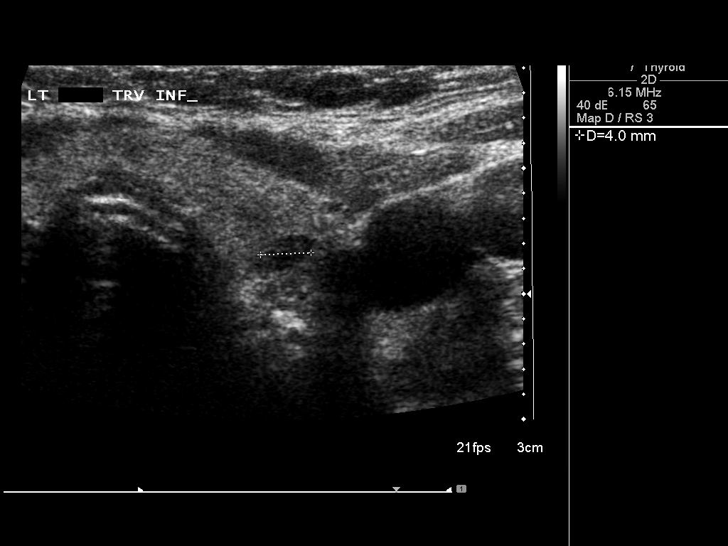

[13 of 25 positions shown; findings below may reference images not displayed]

FINDINGS: Right thyroid lobe

Measurements: 6.6 x 2.5 x 1.9 cm. Solid mid lobe nodule with
calcifications measures 14 x 10 x 10 mm. Previously, the largest mid
lobe nodule measured 9 mm. Solid lower pole nodule measures 14 x 11
x 10 mm and previously measured up to 16 mm. Other smaller nodules
are noted.

Left thyroid lobe

Measurements: 4.8 x 1.7 x 1.7 cm. Solid upper pole nodule measures 6
x 9 x 10 mm. Smaller nodules are scattered throughout the left lobe.

Isthmus

Thickness: 3 mm.  No nodules visualized.

Lymphadenopathy

None visualized.
IMPRESSION: 14 mm right mid lobe nodule with calcifications has enlarged.
Findings meet consensus criteria for biopsy. Ultrasound-guided fine
needle aspiration should be considered, as per the consensus
statement: Management of Thyroid Nodules Detected at US: Society of
Radiologists in Ultrasound Consensus Conference Statement. Radiology
9993; [DATE].

Other nodules are below measurement criteria for fine needle
aspiration biopsy or are not significantly changed.

## 2016-06-17 ENCOUNTER — Telehealth: Payer: Self-pay | Admitting: Physician Assistant

## 2016-06-17 ENCOUNTER — Encounter: Payer: Self-pay | Admitting: Physician Assistant

## 2016-06-17 DIAGNOSIS — E042 Nontoxic multinodular goiter: Secondary | ICD-10-CM | POA: Insufficient documentation

## 2016-06-17 DIAGNOSIS — E041 Nontoxic single thyroid nodule: Secondary | ICD-10-CM

## 2016-06-17 NOTE — Telephone Encounter (Signed)
Chart reviewed. Spoke with [facility]. Message to patient in My Chart.

## 2016-06-18 ENCOUNTER — Telehealth: Payer: Self-pay | Admitting: Physician Assistant

## 2016-06-18 DIAGNOSIS — E041 Nontoxic single thyroid nodule: Secondary | ICD-10-CM

## 2016-06-18 NOTE — Telephone Encounter (Signed)
Faxed US Thyroid Biopsy order and called [facility] to schedule pt for a Monday. [facility] said pt will need to first have just an Ultrasound Thyroid because she has not had one in the past year. If we can get this order changed, I'll be happy to schedule pt. Thank you!

## 2016-06-18 NOTE — Telephone Encounter (Signed)
Orders Placed This Encounter  Procedures  . US THYROID    Standing Status:   Future    Standing Expiration Date:   08/18/2017    Order Specific Question:   Reason for Exam (SYMPTOM  OR DIAGNOSIS REQUIRED)    Answer:   14 mm right mid lobe nodule with calcifications has enlarged.    Order Specific Question:   Preferred imaging location?    Answer:   Internal

## 2016-06-19 NOTE — Telephone Encounter (Signed)
Spoke with pt and let her know order has been sent to [facility] and provided phone number if she needs to call them.

## 2016-07-20 ENCOUNTER — Ambulatory Visit
Admission: RE | Admit: 2016-07-20 | Discharge: 2016-07-20 | Disposition: A | Discharge status: Home / Self Care | Payer: BLUE CROSS/BLUE SHIELD | Source: Ambulatory Visit | Attending: Physician Assistant | Admitting: Physician Assistant

## 2016-07-20 DIAGNOSIS — E042 Nontoxic multinodular goiter: Secondary | ICD-10-CM | POA: Diagnosis not present

## 2016-07-20 DIAGNOSIS — E041 Nontoxic single thyroid nodule: Secondary | ICD-10-CM

## 2016-12-14 ENCOUNTER — Other Ambulatory Visit: Payer: Self-pay | Admitting: Obstetrics and Gynecology

## 2016-12-14 DIAGNOSIS — Z1231 Encounter for screening mammogram for malignant neoplasm of breast: Secondary | ICD-10-CM

## 2016-12-30 ENCOUNTER — Encounter: Payer: Self-pay | Admitting: Physician Assistant

## 2016-12-30 ENCOUNTER — Ambulatory Visit (INDEPENDENT_AMBULATORY_CARE_PROVIDER_SITE_OTHER): Payer: BLUE CROSS/BLUE SHIELD

## 2016-12-30 ENCOUNTER — Ambulatory Visit (INDEPENDENT_AMBULATORY_CARE_PROVIDER_SITE_OTHER): Payer: BLUE CROSS/BLUE SHIELD | Admitting: Physician Assistant

## 2016-12-30 ENCOUNTER — Other Ambulatory Visit: Payer: Self-pay

## 2016-12-30 VITALS — BP 138/84 | HR 75 | Temp 98.9°F | Resp 18 | Ht 65.5 in | Wt 242.4 lb

## 2016-12-30 DIAGNOSIS — Z13220 Encounter for screening for lipoid disorders: Secondary | ICD-10-CM | POA: Diagnosis not present

## 2016-12-30 DIAGNOSIS — Z6836 Body mass index (BMI) 36.0-36.9, adult: Secondary | ICD-10-CM

## 2016-12-30 DIAGNOSIS — G8929 Other chronic pain: Secondary | ICD-10-CM | POA: Diagnosis not present

## 2016-12-30 DIAGNOSIS — B001 Herpesviral vesicular dermatitis: Secondary | ICD-10-CM

## 2016-12-30 DIAGNOSIS — I10 Essential (primary) hypertension: Secondary | ICD-10-CM | POA: Diagnosis not present

## 2016-12-30 DIAGNOSIS — M25561 Pain in right knee: Secondary | ICD-10-CM

## 2016-12-30 DIAGNOSIS — Z23 Encounter for immunization: Secondary | ICD-10-CM

## 2016-12-30 DIAGNOSIS — J309 Allergic rhinitis, unspecified: Secondary | ICD-10-CM | POA: Diagnosis not present

## 2016-12-30 DIAGNOSIS — M545 Low back pain: Secondary | ICD-10-CM | POA: Diagnosis not present

## 2016-12-30 DIAGNOSIS — E042 Nontoxic multinodular goiter: Secondary | ICD-10-CM | POA: Diagnosis not present

## 2016-12-30 DIAGNOSIS — K219 Gastro-esophageal reflux disease without esophagitis: Secondary | ICD-10-CM

## 2016-12-30 DIAGNOSIS — Z0000 Encounter for general adult medical examination without abnormal findings: Secondary | ICD-10-CM | POA: Diagnosis not present

## 2016-12-30 MED ORDER — MELOXICAM 15 MG PO TABS: ORAL_TABLET | ORAL | 3 refills | Status: DC

## 2016-12-30 MED ORDER — VALACYCLOVIR HCL 1 G PO TABS: ORAL_TABLET | ORAL | 0 refills | Status: DC

## 2016-12-30 MED ORDER — PANTOPRAZOLE SODIUM 40 MG PO TBEC: DELAYED_RELEASE_TABLET | ORAL | 3 refills | Status: DC

## 2016-12-30 MED ORDER — LOSARTAN POTASSIUM-HCTZ 100-25 MG PO TABS: ORAL_TABLET | ORAL | 3 refills | Status: DC

## 2016-12-30 MED ORDER — PROPRANOLOL HCL ER 80 MG PO CP24: ORAL_CAPSULE | ORAL | 3 refills | Status: DC

## 2016-12-30 MED ORDER — FLUTICASONE PROPIONATE 50 MCG/ACT NA SUSP: 2.0000 | Freq: Every day | NASAL | 3 refills | Status: AC

## 2016-12-30 MED ORDER — POTASSIUM CHLORIDE CRYS ER 20 MEQ PO TBCR: EXTENDED_RELEASE_TABLET | ORAL | 3 refills | Status: DC

## 2016-12-30 NOTE — Patient Instructions (Addendum)
   IF you received an x-ray today, you will receive an invoice from [facility]. Please contact [facility] at [phone] with questions or concerns regarding your invoice.   IF you received labwork today, you will receive an invoice from [facility]. Please contact [facility] at [phone] with questions or concerns regarding your invoice.   Our billing staff will not be able to assist you with questions regarding bills from these companies.  You will be contacted with the lab results as soon as they are available. The fastest way to get your results is to activate your My Chart account. Instructions are located on the last page of this paperwork. If you have not heard from us regarding the results in 2 weeks, please contact this office.      Preventive Care 40-64 Years, Female Preventive care refers to lifestyle choices and visits with your health care provider that can promote health and wellness. What does preventive care include?  A yearly physical exam. This is also called an annual well check.  Dental exams once or twice a year.  Routine eye exams. Ask your health care provider how often you should have your eyes checked.  Personal lifestyle choices, including: ? Daily care of your teeth and gums. ? Regular physical activity. ? Eating a healthy diet. ? Avoiding tobacco and drug use. ? Limiting alcohol use. ? Practicing safe sex. ? Taking low-dose aspirin daily starting at age 50. ? Taking vitamin and mineral supplements as recommended by your health care provider. What happens during an annual well check? The services and screenings done by your health care provider during your annual well check will depend on your age, overall health, lifestyle risk factors, and family history of disease. Counseling Your health care provider may ask you questions about your:  Alcohol use.  Tobacco use.  Drug use.  Emotional well-being.  Home and relationship  well-being.  Sexual activity.  Eating habits.  Work and work environment.  Method of birth control.  Menstrual cycle.  Pregnancy history.  Screening You may have the following tests or measurements:  Height, weight, and BMI.  Blood pressure.  Lipid and cholesterol levels. These may be checked every 5 years, or more frequently if you are over 50 years old.  Skin check.  Lung cancer screening. You may have this screening every year starting at age 55 if you have a 30-pack-year history of smoking and currently smoke or have quit within the past 15 years.  Fecal occult blood test (FOBT) of the stool. You may have this test every year starting at age 50.  Flexible sigmoidoscopy or colonoscopy. You may have a sigmoidoscopy every 5 years or a colonoscopy every 10 years starting at age 50.  Hepatitis C blood test.  Hepatitis B blood test.  Sexually transmitted disease (STD) testing.  Diabetes screening. This is done by checking your blood sugar (glucose) after you have not eaten for a while (fasting). You may have this done every 1-3 years.  Mammogram. This may be done every 1-2 years. Talk to your health care provider about when you should start having regular mammograms. This may depend on whether you have a family history of breast cancer.  BRCA-related cancer screening. This may be done if you have a family history of breast, ovarian, tubal, or peritoneal cancers.  Pelvic exam and Pap test. This may be done every 3 years starting at age 21. Starting at age 30, this may be done every 5 years if   you have a Pap test in combination with an HPV test.  Bone density scan. This is done to screen for osteoporosis. You may have this scan if you are at high risk for osteoporosis.  Discuss your test results, treatment options, and if necessary, the need for more tests with your health care provider. Vaccines Your health care provider may recommend certain vaccines, such  as:  Influenza vaccine. This is recommended every year.  Tetanus, diphtheria, and acellular pertussis (Tdap, Td) vaccine. You may need a Td booster every 10 years.  Varicella vaccine. You may need this if you have not been vaccinated.  Zoster vaccine. You may need this after age 60.  Measles, mumps, and rubella (MMR) vaccine. You may need at least one dose of MMR if you were born in 1957 or later. You may also need a second dose.  Pneumococcal 13-valent conjugate (PCV13) vaccine. You may need this if you have certain conditions and were not previously vaccinated.  Pneumococcal polysaccharide (PPSV23) vaccine. You may need one or two doses if you smoke cigarettes or if you have certain conditions.  Meningococcal vaccine. You may need this if you have certain conditions.  Hepatitis A vaccine. You may need this if you have certain conditions or if you travel or work in places where you may be exposed to hepatitis A.  Hepatitis B vaccine. You may need this if you have certain conditions or if you travel or work in places where you may be exposed to hepatitis B.  Haemophilus influenzae type b (Hib) vaccine. You may need this if you have certain conditions.  Talk to your health care provider about which screenings and vaccines you need and how often you need them. This information is not intended to replace advice given to you by your health care provider. Make sure you discuss any questions you have with your health care provider. Document Released: 02/08/2015 Document Revised: 10/02/2015 Document Reviewed: 11/13/2014 [employer] Interactive Patient Education  2017 [employer]  

## 2016-12-30 NOTE — Progress Notes (Signed)
Chief Complaint  Patient presents with  . Annual Exam  . Medication Refill    Pt states she needs all meds refilled   Subjective:    Patient ID: Catherine Navarro, female    DOB: 12-30-1955, 61 y.o.   MRN: [id number]  HPI  [name] is a 61 year old Caucasian female who presents for an annual exam and medication refill  Patient states that she has been doing well since her last visit except for right knee pain that she has been experiencing for two weeks. Usually happens when walking. The pain was causing her to limp so she used a cane and that helped.  She has a history of injury to that knee in her in her 48s. She has been taking Ibuprofen for the pain and that has been helping. She states that the pain goes as high as a 10/10 with walking, but in the past few days it has been at a 2/10. The knee swells up some times.  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  Patient goes to [facility] for eye exams yearly. Last visit was supposed to be this past September. She states she will book an appointment soon. She gets dental exams every 6 months, she can't recall the name of her new dentist.   Review of Systems  Constitutional: Negative for appetite change, fatigue, fever and unexpected weight change.  HENT: Negative for rhinorrhea and sore throat.   Respiratory: Negative for shortness of breath.   Cardiovascular: Negative for chest pain and palpitations.  Gastrointestinal: Positive for constipation (activia yoghurt helps. Citrucel fiber helps too). Negative for abdominal pain, nausea and vomiting.  Genitourinary: Negative for dysuria and hematuria.  Musculoskeletal: Positive for back pain (lower back midline. She has a history of degenerative disk disease).  Neurological: Negative for light-headedness and headaches.      Patient Active Problem List   Diagnosis Date Noted  . Multinodular goiter 06/17/2016  . Muscle spasm of back 06/04/2015  . Cutaneous skin tags  04/12/2014  . BMI 36.0-36.9,adult 08/01/2013  . 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 Family history of malignant neoplasm of gastrointestinal tract 12/22/2010  . Diverticulosis of colon (without mention of hemorrhage) 12/22/2010  . Benign neoplasm of colon 12/22/2010  . Fatty liver 11/27/2010  . Costochondral chest pain 11/27/2010  . GERD (gastroesophageal reflux disease) 11/27/2010  . HELICOBACTER PYLORI GASTRITIS, HX OF 05/02/2009  . ABDOMINAL PAIN-RUQ 07/12/2007  . Essential hypertension 03/23/2007  . Allergic rhinitis 03/23/2007  . IBS 03/23/2007  . OSTEOARTHRITIS 03/23/2007  . History of acute pancreatitis 03/23/2007  . GASTRITIS 12/21/2005  . HIATAL HERNIA 12/21/2005   Past Medical History:  Diagnosis Date  . Abdominal pain 11/27/2010  . Accessory skin tags 01/25/2013  . Allergy   . Anemia    past hx  . Anisocoria    right eye  . Colon polyps 12/22/2010   Tubular Adenoma and Hyperplastic Polyp  . DDD (degenerative disc disease), cervical   . Elevated LFTs 11/27/2010  . Family history of colon cancer   . Fatty infiltration of liver   . Gastritis   . GERD (gastroesophageal reflux disease)   . Hiatal hernia   . HTN (hypertension)   . Hypothyroidism    with nodules  . IBS (irritable bowel syndrome)   . Muscle spasm of back 06/04/2015  . Osteoarthritis   . Pancreatitis   . Sinusitis    Past Surgical History:  Procedure Laterality Date  . ANAL FISTULECTOMY    .  CHOLECYSTECTOMY    . COLONOSCOPY    . COLONOSCOPY W/ BIOPSIES    . ESOPHAGOGASTRODUODENOSCOPY    . EYE SURGERY    . PATELLA RECONSTRUCTION     pins  . POLYPECTOMY    . TUBAL LIGATION    . UPPER GASTROINTESTINAL ENDOSCOPY     Current Meds  Medication Sig  . aspirin 81 MG tablet Take 81 mg by mouth every other day.   . Calcium Carbonate-Vitamin D (CALCIUM-D) 600-400 MG-UNIT TABS Take by mouth 2 (two) times daily.  . Cholecalciferol (VITAMIN D) 2000 UNITS CAPS Take by mouth.    . fish oil-omega-3 fatty acids 1000  MG capsule Take 1 g by mouth every other day.  . fluticasone (FLONASE) 50 MCG/ACT nasal spray Place 2 sprays into both nostrils daily.  . Garlic 852 MG TABS Take by mouth.    . loratadine (CLARITIN REDITABS) 10 MG dissolvable tablet Take 10 mg by mouth daily.    Marland Kitchen losartan-hydrochlorothiazide (HYZAAR) 100-25 MG tablet Take 1 tablet by mouth  daily  . meloxicam (MOBIC) 15 MG tablet Take 1 tablet by mouth  daily  . Multiple Vitamin (MULTIVITAMIN PO) Take by mouth 1 day or 1 dose.    . pantoprazole (PROTONIX) 40 MG tablet Take 1 tablet by mouth two  times daily  . potassium chloride SA (K-DUR,KLOR-CON) 20 MEQ tablet Take 1 tablet by mouth  daily  . propranolol ER (INDERAL LA) 80 MG 24 hr capsule Take 1 capsule by mouth  daily  . pyridOXINE (VITAMIN B-6) 100 MG tablet Take 200 mg by mouth daily.  . valACYclovir (VALTREX) 1000 MG tablet Take 2 tablets AS SOON as first symptoms of fever blister appear.  Take 2 tablets 12 hours later.  . vitamin B-12 (CYANOCOBALAMIN) 1000 MCG tablet Take 1,000 mcg by mouth daily.  Marland Kitchen zoster vaccine live, PF, (ZOSTAVAX) 77824 UNT/0.65ML injection Inject 19,400 Units into the skin once.  . [DISCONTINUED] fluticasone (FLONASE) 50 MCG/ACT nasal spray Place 2 sprays into both nostrils daily.  . [DISCONTINUED] losartan-hydrochlorothiazide (HYZAAR) 100-25 MG tablet Take 1 tablet by mouth  daily  . [DISCONTINUED] meloxicam (MOBIC) 15 MG tablet Take 1 tablet by mouth  daily (Patient taking differently: Take 15 mg by mouth daily. Take 1 tablet by mouth  daily)  . [DISCONTINUED] pantoprazole (PROTONIX) 40 MG tablet Take 1 tablet by mouth two  times daily  . [DISCONTINUED] potassium chloride SA (K-DUR,KLOR-CON) 20 MEQ tablet Take 1 tablet by mouth  daily  . [DISCONTINUED] propranolol ER (INDERAL LA) 80 MG 24 hr capsule Take 1 capsule by mouth  daily  . [DISCONTINUED] valACYclovir (VALTREX) 1000 MG tablet Take 2 tablets AS SOON as first symptoms of fever blister appear.  Take 2  tablets 12 hours later.   Current Facility-Administered Medications for the 12/30/60 encounter (Office Visit) with Harrison Mons, PA-C  Medication  . 0.9 %  sodium chloride infusion   Allergies  Allergen Reactions  . Amoxicillin     rash  . Codeine     REACTION: headache   Social History   Socioeconomic History  . Marital status: Divorced    Spouse name: BF: Laddie Aquas  . Number of children: 3  . Years of education: 12+  . Highest education level: Not on file  Social Needs  . Financial resource strain: Not on file  . Food insecurity - worry: Not on file  . Food insecurity - inability: Not on file  . Transportation needs - medical: Not on file  .  Transportation needs - non-medical: Not on file  Occupational History  . Occupation: Education officer, environmental: OTHER  Tobacco Use  . Smoking status: Former Smoker    Types: Cigarettes    Last attempt to quit: 01/26/1998    Years since quitting: 18.9  . Smokeless tobacco: Never Used  Substance and Sexual Activity  . Alcohol use: No  . Drug use: No  . Sexual activity: No  Other Topics Concern  . Not on file  Social History Narrative   Lives with her boyfriend, Laddie Aquas.   Her ex-husband died of heart attack at age 64.    One son lives next door, one in [place], [place] and another in [place].    Objective:   Physical Exam  Constitutional: She appears well-developed and well-nourished. No distress.  HENT:  Head: Normocephalic and atraumatic.  Right Ear: External ear normal.  Left Ear: External ear normal.  Nose: Nose normal.  Mouth/Throat: Oropharynx is clear and moist.  Eyes: Conjunctivae are normal.  Right eye anisocoria  Neck: Neck supple. No thyromegaly present.  Cardiovascular: Normal rate, regular rhythm, normal heart sounds and intact distal pulses.  Pulmonary/Chest: Effort normal and breath sounds normal.  Abdominal: Soft. Bowel sounds are normal.  Musculoskeletal:  Mild edema in right knee  Lymphadenopathy:     She has no cervical adenopathy.  Neurological: She is alert.  Psychiatric: She has a normal mood and affect.      Assessment & Plan:    1. Annual physical exam Age appropriate preventative care information provided  2. Essential hypertension Controlled. Continue current regimen - CBC with Differential/Platelet - Comprehensive metabolic panel - TSH - Urinalysis, dipstick only - propranolol ER (INDERAL LA) 80 MG 24 hr capsule; Take 1 capsule by mouth  daily  Dispense: 90 capsule; Refill: 3 - potassium chloride SA (K-DUR,KLOR-CON) 20 MEQ tablet; Take 1 tablet by mouth  daily  Dispense: 90 tablet; Refill: 3 - losartan-hydrochlorothiazide (HYZAAR) 100-25 MG tablet; Take 1 tablet by mouth  daily  Dispense: 90 tablet; Refill: 3  3. Multinodular goiter Follow up based on lab results - TSH  4. BMI 36.0-36.9,adult Lifestyle management discussed  5. Need for influenza vaccination Administered in clinic today - Flu Vaccine QUAD 36+ mos IM  6. Screening for hyperlipidemia Discussed healthy eating and increasing exercise as tolerated. - Lipid panel  7. Chronic pain of right knee Continue current medication regimen. Further treatment will be considered based on imaging results. - meloxicam (MOBIC) 15 MG tablet; Take 1 tablet by mouth  daily  Dispense: 90 tablet; Refill: 3 - DG Knee Complete 4 Views Right; Future - DG Knee Complete 4 Views Right  8. Fever blister Medication refilled - valACYclovir (VALTREX) 1000 MG tablet; Take 2 tablets AS SOON as first symptoms of fever blister appear.  Take 2 tablets 12 hours later.  Dispense: 30 tablet; Refill: 0  9. Allergic rhinitis, unspecified seasonality, unspecified trigger Medication refilled - fluticasone (FLONASE) 50 MCG/ACT nasal spray; Place 2 sprays into both nostrils daily.  Dispense: 48 g; Refill: 3  10. Gastroesophageal reflux disease, esophagitis presence not specified Medication refilled - pantoprazole (PROTONIX) 40 MG  tablet; Take 1 tablet by mouth two  times daily  Dispense: 180 tablet; Refill: 3  11. Chronic right-sided low back pain without sciatica Treatment will be considered based on imaging results. - DG Lumbar Spine Complete; Future - DG Lumbar Spine Complete  Follow up in 6 months for re-evaluation of blood pressure. Follow up  sooner based on imaging results.   [place], [place]

## 2016-12-30 NOTE — Progress Notes (Signed)
Patient ID: Catherine Navarro, female    DOB: 01/28/1955, 61 y.o.   MRN: [id number]  PCP: Harrison Mons, PA-C  Chief Complaint  Patient presents with  . Annual Exam  . Medication Refill    Pt states she needs all meds refilled    Subjective:   Presents for [employer] [employer] Visit.  She is doing well other than 2 weeks of RIGHT knee pain, usually with walking. Using a cane helped. Ibuprofen helped. Remote history of RIGHT knee injury. Intermittent swelling. Pain ranges 2-10/10.  Cervical Cancer Screening: no longer a candidate Breast Cancer Screening: normal mammogram 01/29/2015, normal. Repeat 01/2017 Colorectal Cancer Screening: 12/23/2015, repeat in 11/2018 Bone Density Testing: not yet HIV Screening: completed. Very low risk. STI Screening: declined. Very low risk. Seasonal Influenza Vaccination: today Td/Tdap Vaccination: 01/12/2008. Pneumococcal Vaccination: not yet a candidate Zoster Vaccination: not yet. Frequency of Dental evaluation: Q6 months Frequency of Eye evaluation: annually with [facility]     Patient Active Problem List   Diagnosis Date Noted  . Multinodular goiter 06/17/2016  . Muscle spasm of back 06/04/2015  . Cutaneous skin tags 04/12/2014  . BMI 36.0-36.9,adult 08/01/2013  . 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 Family history of malignant neoplasm of gastrointestinal tract 12/22/2010  . Diverticulosis of colon (without mention of hemorrhage) 12/22/2010  . Benign neoplasm of colon 12/22/2010  . Fatty liver 11/27/2010  . Costochondral chest pain 11/27/2010  . GERD (gastroesophageal reflux disease) 11/27/2010  . HELICOBACTER PYLORI GASTRITIS, HX OF 05/02/2009  . ABDOMINAL PAIN-RUQ 07/12/2007  . Essential hypertension 03/23/2007  . Allergic rhinitis 03/23/2007  . IBS 03/23/2007  . OSTEOARTHRITIS 03/23/2007  . History of acute pancreatitis 03/23/2007  . GASTRITIS 12/21/2005  . HIATAL HERNIA 12/21/2005    Past Medical History:  Diagnosis Date  .  Abdominal pain 11/27/2010  . Accessory skin tags 01/25/2013  . Allergy   . Anemia    past hx  . Anisocoria    right eye  . Colon polyps 12/22/2010   Tubular Adenoma and Hyperplastic Polyp  . DDD (degenerative disc disease), cervical   . Elevated LFTs 11/27/2010  . Family history of colon cancer   . Fatty infiltration of liver   . Gastritis   . GERD (gastroesophageal reflux disease)   . Hiatal hernia   . HTN (hypertension)   . Hypothyroidism    with nodules  . IBS (irritable bowel syndrome)   . Muscle spasm of back 06/04/2015  . Osteoarthritis   . Pancreatitis   . Sinusitis      Prior to Admission medications   Medication Sig Start Date End Date Taking? Authorizing Provider  aspirin 81 MG tablet Take 81 mg by mouth every other day.    Yes [provider]  Calcium Carbonate-Vitamin D (CALCIUM-D) 600-400 MG-UNIT TABS Take by mouth 2 (two) times daily.   Yes [provider]  Cholecalciferol (VITAMIN D) 2000 UNITS CAPS Take by mouth.     Yes [provider]  fish oil-omega-3 fatty acids 1000 MG capsule Take 1 g by mouth every other day.   Yes [provider]  fluticasone (FLONASE) 50 MCG/ACT nasal spray Place 2 sprays into both nostrils daily. 02/05/15  Yes Tonati[place] Mallon, PA-C  Garlic 676 MG TABS Take by mouth.     Yes [provider]  loratadine (CLARITIN REDITABS) 10 MG dissolvable tablet Take 10 mg by mouth daily.     Yes [provider]  losartan-hydrochlorothiazide (HYZAAR) 100-25 MG tablet Take 1  tablet by mouth  daily 11/19/15  Yes Devario Bucklew, PA-C  meloxicam (MOBIC) 15 MG tablet Take 1 tablet by mouth  daily Patient taking differently: Take 15 mg by mouth daily. Take 1 tablet by mouth  daily 11/19/15  Yes Cambri Plourde, PA-C  Multiple Vitamin (MULTIVITAMIN PO) Take by mouth 1 day or 1 dose.     Yes [provider]  pantoprazole (PROTONIX) 40 MG tablet Take 1 tablet by mouth two  times daily 11/19/15  Yes  Tanijah Morais, PA-C  potassium chloride SA (K-DUR,KLOR-CON) 20 MEQ tablet Take 1 tablet by mouth  daily 10/30/15  Yes Melana Hingle, PA-C  propranolol ER (INDERAL LA) 80 MG 24 hr capsule Take 1 capsule by mouth  daily 10/30/15  Yes Katelynne Revak, PA-C  pyridOXINE (VITAMIN B-6) 100 MG tablet Take 200 mg by mouth daily.   Yes [provider]  valACYclovir (VALTREX) 1000 MG tablet Take 2 tablets AS SOON as first symptoms of fever blister appear.  Take 2 tablets 12 hours later. 07/26/12  Yes Aryonna Gunnerson, PA-C  vitamin B-12 (CYANOCOBALAMIN) 1000 MCG tablet Take 1,000 mcg by mouth daily.   Yes [provider]  zoster vaccine live, PF, (ZOSTAVAX) 29518 UNT/0.65ML injection Inject 19,400 Units into the skin once. 02/05/15  Yes Jjesus Dingley, PA-C  cyclobenzaprine (FLEXERIL) 5 MG tablet Take 1 tablet (5 mg total) by mouth 3 (three) times daily as needed for muscle spasms. Patient not taking: Reported on 12/23/2015 06/04/15   Estill Dooms, NP    Allergies  Allergen Reactions  . Amoxicillin     rash  . Codeine     REACTION: headache    Past Surgical History:  Procedure Laterality Date  . ANAL FISTULECTOMY    . CHOLECYSTECTOMY    . COLONOSCOPY    . COLONOSCOPY W/ BIOPSIES    . ESOPHAGOGASTRODUODENOSCOPY    . EYE SURGERY    . PATELLA RECONSTRUCTION     pins  . POLYPECTOMY    . TUBAL LIGATION    . UPPER GASTROINTESTINAL ENDOSCOPY      Family History  Problem Relation Age of Onset  . Breast cancer Maternal Aunt   . Diabetes Mother   . Irritable bowel syndrome Mother   . Stroke Mother   . Heart attack Paternal Grandfather   . COPD Father        emphysema  . Cancer Father   . Alzheimer's disease Paternal Grandmother   . Alzheimer's disease Maternal Aunt   . Cancer Sister        colorectal and liver  . Colon cancer Sister   . Cancer Brother 77       colon-colorectal colon cancer  . Colon cancer Brother   . Hyperlipidemia Sister   . Hypertension Sister    . Colon polyps Sister   . Diabetes Maternal Grandmother   . Hyperlipidemia Brother        triglycerides  . Hypertension Brother   . Colon polyps Brother   . Rectal cancer Brother   . Diabetes Sister   . Liver disease Sister        fatty liver, cirrhosis  . Colon polyps Sister   . Heart attack Son 57       rare genetic condition  . Heart disease Neg Hx   . Esophageal cancer Neg Hx   . Stomach cancer Neg Hx     Social History   Socioeconomic History  . Marital status: Divorced    Spouse  name: BF: Laddie Aquas  . Number of children: 3  . Years of education: 12+  . Highest education level: None  Social Needs  . Financial resource strain: None  . Food insecurity - worry: None  . Food insecurity - inability: None  . Transportation needs - medical: None  . Transportation needs - non-medical: None  Occupational History  . Occupation: Education officer, environmental: OTHER  Tobacco Use  . Smoking status: Former Smoker    Types: Cigarettes    Last attempt to quit: 01/26/1998    Years since quitting: 18.9  . Smokeless tobacco: Never Used  Substance and Sexual Activity  . Alcohol use: No  . Drug use: No  . Sexual activity: No  Other Topics Concern  . None  Social History Narrative   Lives with her boyfriend, Laddie Aquas.   Her ex-husband died of heart attack at age 32.    One son lives next door, one in [place], [place] and another in [place].       Review of Systems  Constitutional: Negative.   HENT: Negative.   Eyes: Negative.   Respiratory: Negative.   Cardiovascular: Negative.   Gastrointestinal: Positive for constipation (Activia yogurt and dietary fiber help). Negative for abdominal distention, abdominal pain, anal bleeding, blood in stool, diarrhea, nausea, rectal pain and vomiting.  Endocrine: Negative.   Genitourinary: Negative.   Musculoskeletal: Positive for arthralgias (knee), back pain (lower back), gait problem and joint swelling. Negative for myalgias, neck  pain and neck stiffness.  Skin: Negative.   Allergic/Immunologic: Negative.   Neurological: Negative for dizziness, tremors, seizures, syncope, facial asymmetry, speech difficulty, weakness, light-headedness, numbness and headaches.  Hematological: Negative.   Psychiatric/Behavioral: Negative.         Objective:  Physical Exam  Constitutional: She is oriented to person, place, and time. Vital signs are normal. She appears well-developed and well-nourished. She is active and cooperative. No distress.  BP 138/84 (BP Location: Left Arm, Patient Position: Sitting, Cuff Size: Large)   Pulse 75   Temp 98.9 F (37.2 C) (Oral)   Resp 18   Ht 5' 5.5" (1.664 m)   Wt 242 lb 6.4 oz (110 kg)   SpO2 97%   BMI 39.72 kg/m    HENT:  Head: Normocephalic and atraumatic.  Right Ear: Hearing, tympanic membrane, external ear and ear canal normal. No foreign bodies.  Left Ear: Hearing, tympanic membrane, external ear and ear canal normal. No foreign bodies.  Nose: Nose normal.  Mouth/Throat: Uvula is midline, oropharynx is clear and moist and mucous membranes are normal. No oral lesions. Normal dentition. No dental abscesses or uvula swelling. No oropharyngeal exudate.  Eyes: Conjunctivae, EOM and lids are normal. Right eye exhibits no discharge. Left eye exhibits no discharge. No scleral icterus. Right pupil is not round (long-standing anisocoria).  Fundoscopic exam:      The right eye shows no arteriolar narrowing, no AV nicking, no exudate, no hemorrhage and no papilledema. The right eye shows red reflex.       The left eye shows no arteriolar narrowing, no AV nicking, no exudate, no hemorrhage and no papilledema. The left eye shows red reflex.  Neck: Trachea normal, normal range of motion and full passive range of motion without pain. Neck supple. No spinous process tenderness and no muscular tenderness present. No thyroid mass and no thyromegaly present.  Cardiovascular: Normal rate, regular rhythm,  normal heart sounds, intact distal pulses and normal pulses.  Pulmonary/Chest: Effort normal  and breath sounds normal. Right breast exhibits no inverted nipple, no mass, no nipple discharge, no skin change and no tenderness. Left breast exhibits no inverted nipple, no mass, no nipple discharge, no skin change and no tenderness. Breasts are symmetrical.  Musculoskeletal: She exhibits no edema or tenderness.       Right knee: She exhibits swelling (mild). She exhibits normal range of motion, no effusion, no ecchymosis, no deformity, no laceration, no erythema, normal alignment, no LCL laxity, normal patellar mobility, no bony tenderness, normal meniscus and no MCL laxity. No tenderness found.       Cervical back: Normal.       Thoracic back: Normal.       Lumbar back: Normal.  Lymphadenopathy:       Head (right side): No tonsillar, no preauricular, no posterior auricular and no occipital adenopathy present.       Head (left side): No tonsillar, no preauricular, no posterior auricular and no occipital adenopathy present.    She has no cervical adenopathy.       Right: No supraclavicular adenopathy present.       Left: No supraclavicular adenopathy present.  Neurological: She is alert and oriented to person, place, and time. She has normal strength and normal reflexes. No cranial nerve deficit. She exhibits normal muscle tone. Coordination and gait normal.  Skin: Skin is warm, dry and intact. No rash noted. She is not diaphoretic. No cyanosis or erythema. Nails show no clubbing.  Psychiatric: She has a normal mood and affect. Her speech is normal and behavior is normal. Judgment and thought content normal.           Assessment & Plan:   Problem List Items Addressed This Visit    Essential hypertension    Controlled.      Relevant Medications   propranolol ER (INDERAL LA) 80 MG 24 hr capsule   potassium chloride SA (K-DUR,KLOR-CON) 20 MEQ tablet   losartan-hydrochlorothiazide (HYZAAR)  100-25 MG tablet   Other Relevant Orders   CBC with Differential/Platelet (Completed)   Comprehensive metabolic panel (Completed)   TSH (Completed)   Urinalysis, dipstick only (Completed)   Allergic rhinitis    Stable.      Relevant Medications   fluticasone (FLONASE) 50 MCG/ACT nasal spray   GERD (gastroesophageal reflux disease)    Stable.      Relevant Medications   pantoprazole (PROTONIX) 40 MG tablet   BMI 36.0-36.9,adult   Multinodular goiter   Relevant Medications   propranolol ER (INDERAL LA) 80 MG 24 hr capsule   Other Relevant Orders   TSH (Completed)    Other Visit Diagnoses    Annual physical exam    -  Primary   Age appropriate health guidance provided.   Need for influenza vaccination       Relevant Orders   Flu Vaccine QUAD 36+ mos IM (Completed)   Screening for hyperlipidemia       Relevant Orders   Lipid panel (Completed)   Chronic pain of right knee       Relevant Medications   meloxicam (MOBIC) 15 MG tablet   Other Relevant Orders   DG Knee Complete 4 Views Right (Completed)   Fever blister       Relevant Medications   valACYclovir (VALTREX) 1000 MG tablet   Chronic right-sided low back pain without sciatica       Relevant Medications   meloxicam (MOBIC) 15 MG tablet   Other Relevant Orders   DG Lumbar  Spine Complete (Completed)       Return in about 6 months (around 06/30/2017) for re-evalaution of blood pressure.   Fara Chute, PA-C [facility]

## 2016-12-31 LAB — CBC WITH DIFFERENTIAL/PLATELET
BASOS ABS: 0.1 10*3/uL (ref 0.0–0.2)
Basos: 1 %
EOS (ABSOLUTE): 0.1 10*3/uL (ref 0.0–0.4)
Eos: 1 %
HEMOGLOBIN: 13.3 g/dL (ref 11.1–15.9)
Hematocrit: 40.9 % (ref 34.0–46.6)
Immature Grans (Abs): 0 10*3/uL (ref 0.0–0.1)
Immature Granulocytes: 0 %
LYMPHS ABS: 1.8 10*3/uL (ref 0.7–3.1)
Lymphs: 30 %
MCH: 28.4 pg (ref 26.6–33.0)
MCHC: 32.5 g/dL (ref 31.5–35.7)
MCV: 87 fL (ref 79–97)
MONOCYTES: 7 %
Monocytes Absolute: 0.4 10*3/uL (ref 0.1–0.9)
Neutrophils Absolute: 3.7 10*3/uL (ref 1.4–7.0)
Neutrophils: 61 %
PLATELETS: 239 10*3/uL (ref 150–379)
RBC: 4.69 x10E6/uL (ref 3.77–5.28)
RDW: 13.7 % (ref 12.3–15.4)
WBC: 6.1 10*3/uL (ref 3.4–10.8)

## 2016-12-31 LAB — COMPREHENSIVE METABOLIC PANEL
A/G RATIO: 1.7 (ref 1.2–2.2)
ALBUMIN: 4.5 g/dL (ref 3.6–4.8)
ALT: 33 IU/L — ABNORMAL HIGH (ref 0–32)
AST: 29 IU/L (ref 0–40)
Alkaline Phosphatase: 91 IU/L (ref 39–117)
BILIRUBIN TOTAL: 0.6 mg/dL (ref 0.0–1.2)
BUN / CREAT RATIO: 14 (ref 12–28)
BUN: 11 mg/dL (ref 8–27)
CHLORIDE: 101 mmol/L (ref 96–106)
CO2: 26 mmol/L (ref 20–29)
Calcium: 10.1 mg/dL (ref 8.7–10.3)
Creatinine, Ser: 0.81 mg/dL (ref 0.57–1.00)
GFR calc non Af Amer: 78 mL/min/{1.73_m2} (ref >59)
GFR, EST AFRICAN AMERICAN: 90 mL/min/{1.73_m2} (ref >59)
GLOBULIN, TOTAL: 2.6 g/dL (ref 1.5–4.5)
GLUCOSE: 105 mg/dL — AB (ref 65–99)
POTASSIUM: 4.1 mmol/L (ref 3.5–5.2)
SODIUM: 143 mmol/L (ref 134–144)
Total Protein: 7.1 g/dL (ref 6.0–8.5)

## 2016-12-31 LAB — LIPID PANEL
CHOLESTEROL TOTAL: 163 mg/dL (ref 100–199)
Chol/HDL Ratio: 2.6 ratio (ref 0.0–4.4)
HDL: 62 mg/dL (ref >39)
LDL Calculated: 78 mg/dL (ref 0–99)
Triglycerides: 114 mg/dL (ref 0–149)
VLDL CHOLESTEROL CAL: 23 mg/dL (ref 5–40)

## 2016-12-31 LAB — URINALYSIS, DIPSTICK ONLY
BILIRUBIN UA: NEGATIVE
Glucose, UA: NEGATIVE
KETONES UA: NEGATIVE
NITRITE UA: NEGATIVE
Protein, UA: NEGATIVE
RBC UA: NEGATIVE
SPEC GRAV UA: 1.021 (ref 1.005–1.030)
Urobilinogen, Ur: 0.2 mg/dL (ref 0.2–1.0)
pH, UA: 6 (ref 5.0–7.5)

## 2016-12-31 LAB — TSH: TSH: 0.97 u[IU]/mL (ref 0.450–4.500)

## 2017-01-04 ENCOUNTER — Encounter: Payer: Self-pay | Admitting: Physician Assistant

## 2017-01-04 DIAGNOSIS — I700 Atherosclerosis of aorta: Secondary | ICD-10-CM | POA: Insufficient documentation

## 2017-01-04 DIAGNOSIS — M5136 Other intervertebral disc degeneration, lumbar region: Secondary | ICD-10-CM | POA: Insufficient documentation

## 2017-01-04 NOTE — Assessment & Plan Note (Signed)
Stable

## 2017-01-04 NOTE — Assessment & Plan Note (Signed)
Controlled.  

## 2017-01-12 ENCOUNTER — Ambulatory Visit
Admission: RE | Admit: 2017-01-12 | Discharge: 2017-01-12 | Disposition: A | Discharge status: Home / Self Care | Payer: BLUE CROSS/BLUE SHIELD | Source: Ambulatory Visit | Attending: Obstetrics and Gynecology | Admitting: Obstetrics and Gynecology

## 2017-01-12 DIAGNOSIS — Z1231 Encounter for screening mammogram for malignant neoplasm of breast: Secondary | ICD-10-CM | POA: Diagnosis not present

## 2017-01-13 ENCOUNTER — Other Ambulatory Visit: Payer: Self-pay | Admitting: Obstetrics and Gynecology

## 2017-01-13 DIAGNOSIS — R928 Other abnormal and inconclusive findings on diagnostic imaging of breast: Secondary | ICD-10-CM

## 2017-01-18 ENCOUNTER — Ambulatory Visit: Payer: BLUE CROSS/BLUE SHIELD

## 2017-01-18 ENCOUNTER — Ambulatory Visit
Admission: RE | Admit: 2017-01-18 | Discharge: 2017-01-18 | Disposition: A | Discharge status: Home / Self Care | Payer: BLUE CROSS/BLUE SHIELD | Source: Ambulatory Visit | Attending: Obstetrics and Gynecology | Admitting: Obstetrics and Gynecology

## 2017-01-18 DIAGNOSIS — R928 Other abnormal and inconclusive findings on diagnostic imaging of breast: Secondary | ICD-10-CM

## 2017-01-18 DIAGNOSIS — R922 Inconclusive mammogram: Secondary | ICD-10-CM | POA: Diagnosis not present

## 2017-03-04 ENCOUNTER — Telehealth: Payer: Self-pay | Admitting: Physician Assistant

## 2017-03-04 NOTE — Telephone Encounter (Signed)
Called pt to reschedule appt with Harrison Mons. She will not be in the office that day. When pt calls back in, please reschedule pt for any other day Left message.  Thanks!

## 2017-05-03 ENCOUNTER — Encounter: Payer: Self-pay | Admitting: Physician Assistant

## 2017-07-06 ENCOUNTER — Ambulatory Visit: Payer: BLUE CROSS/BLUE SHIELD | Admitting: Physician Assistant

## 2017-07-19 ENCOUNTER — Telehealth: Payer: Self-pay | Admitting: Family Medicine

## 2017-07-19 NOTE — Telephone Encounter (Unsigned)
Copied from [place] [id number]. Topic: Quick Communication - See Telephone Encounter >> Jul 19, 2017 11:35 AM Neva Seat wrote: Pt needing her recent Xrays put on a disc for her Alain [facility] appt on Wed. 07-21-17. Please call pt to let her know when to pick up.

## 2017-07-21 DIAGNOSIS — M1711 Unilateral primary osteoarthritis, right knee: Secondary | ICD-10-CM | POA: Diagnosis not present

## 2017-07-21 DIAGNOSIS — M25561 Pain in right knee: Secondary | ICD-10-CM | POA: Insufficient documentation

## 2017-09-01 DIAGNOSIS — M25561 Pain in right knee: Secondary | ICD-10-CM | POA: Diagnosis not present

## 2017-09-01 DIAGNOSIS — M1711 Unilateral primary osteoarthritis, right knee: Secondary | ICD-10-CM | POA: Diagnosis not present

## 2017-11-08 DIAGNOSIS — M1711 Unilateral primary osteoarthritis, right knee: Secondary | ICD-10-CM | POA: Diagnosis not present

## 2017-11-08 DIAGNOSIS — M25561 Pain in right knee: Secondary | ICD-10-CM | POA: Diagnosis not present

## 2017-12-03 DIAGNOSIS — M1711 Unilateral primary osteoarthritis, right knee: Secondary | ICD-10-CM | POA: Diagnosis not present

## 2017-12-03 DIAGNOSIS — M25561 Pain in right knee: Secondary | ICD-10-CM | POA: Diagnosis not present

## 2017-12-14 ENCOUNTER — Ambulatory Visit: Payer: BLUE CROSS/BLUE SHIELD | Admitting: Family Medicine

## 2017-12-14 ENCOUNTER — Encounter: Payer: Self-pay | Admitting: Family Medicine

## 2017-12-14 VITALS — BP 132/78 | HR 59 | Ht 65.5 in | Wt 243.0 lb

## 2017-12-14 DIAGNOSIS — Z01818 Encounter for other preprocedural examination: Secondary | ICD-10-CM | POA: Diagnosis not present

## 2017-12-14 DIAGNOSIS — Z23 Encounter for immunization: Secondary | ICD-10-CM

## 2017-12-14 DIAGNOSIS — R739 Hyperglycemia, unspecified: Secondary | ICD-10-CM

## 2017-12-14 DIAGNOSIS — I10 Essential (primary) hypertension: Secondary | ICD-10-CM | POA: Diagnosis not present

## 2017-12-14 DIAGNOSIS — M1711 Unilateral primary osteoarthritis, right knee: Secondary | ICD-10-CM

## 2017-12-14 LAB — POCT URINALYSIS DIP (MANUAL ENTRY)
Bilirubin, UA: NEGATIVE
Blood, UA: NEGATIVE
Glucose, UA: NEGATIVE mg/dL
Ketones, POC UA: NEGATIVE mg/dL
Leukocytes, UA: NEGATIVE
Nitrite, UA: NEGATIVE
Protein Ur, POC: NEGATIVE mg/dL
Spec Grav, UA: 1.015 (ref 1.010–1.025)
Urobilinogen, UA: 0.2 E.U./dL
pH, UA: 7.5 (ref 5.0–8.0)

## 2017-12-14 LAB — HEMOGLOBIN A1C
Est. average glucose Bld gHb Est-mCnc: 117 mg/dL
Hgb A1c MFr Bld: 5.7 % — ABNORMAL HIGH (ref 4.8–5.6)

## 2017-12-14 LAB — CBC WITH DIFFERENTIAL/PLATELET
Basophils Absolute: 0.1 10*3/uL (ref 0.0–0.2)
Basos: 1 %
EOS (ABSOLUTE): 0.1 10*3/uL (ref 0.0–0.4)
Eos: 2 %
Hematocrit: 37.5 % (ref 34.0–46.6)
Hemoglobin: 12.9 g/dL (ref 11.1–15.9)
Immature Grans (Abs): 0 10*3/uL (ref 0.0–0.1)
Immature Granulocytes: 0 %
Lymphocytes Absolute: 1.8 10*3/uL (ref 0.7–3.1)
Lymphs: 32 %
MCH: 28.3 pg (ref 26.6–33.0)
MCHC: 34.4 g/dL (ref 31.5–35.7)
MCV: 82 fL (ref 79–97)
Monocytes Absolute: 0.5 10*3/uL (ref 0.1–0.9)
Monocytes: 9 %
Neutrophils Absolute: 3 10*3/uL (ref 1.4–7.0)
Neutrophils: 56 %
Platelets: 236 10*3/uL (ref 150–450)
RBC: 4.56 x10E6/uL (ref 3.77–5.28)
RDW: 12.5 % (ref 12.3–15.4)
WBC: 5.5 10*3/uL (ref 3.4–10.8)

## 2017-12-14 NOTE — Patient Instructions (Signed)
° ° ° °  If you have lab work done today you will be contacted with your lab results within the next 2 weeks.  If you have not heard from us then please contact us. The fastest way to get your results is to register for My Chart. ° ° °IF you received an x-ray today, you will receive an invoice from [facility]. Please contact [facility] at [phone] with questions or concerns regarding your invoice.  ° °IF you received labwork today, you will receive an invoice from [facility]. Please contact [facility] at [phone] with questions or concerns regarding your invoice.  ° °Our billing staff will not be able to assist you with questions regarding bills from these companies. ° °You will be contacted with the lab results as soon as they are available. The fastest way to get your results is to activate your My Chart account. Instructions are located on the last page of this paperwork. If you have not heard from us regarding the results in 2 weeks, please contact this office. °  ° ° ° °

## 2017-12-14 NOTE — Progress Notes (Signed)
11/19/201912:17 PM  JANEANN PAISLEY [birth year], 62 y.o. female [id number]  Chief Complaint  Patient presents with  . Pre-op Exam    having total knee replacement in the right knee    HPI:   Patient is a 62 y.o. female with past medical history significant for HTN, GERD, OA right knee  who presents today for preop for R TKA  RCRI: Surgery risk: mod right TKA H/o ischemic heart disease: no  H/o of heart failure: no H/o CVA: no DM on insulin: no Lab Results  Component Value Date   HGBA1C 5.7 08/06/2015   Preoperative creatinine > 2mg /dl: no Rides stationary bicycle and doing PT, does vigorous yard work and housework without issues other than her knee pain  Denies any issues with anesthesia   Fall Risk  12/14/2017 12/30/2016 08/06/2015 02/05/2015 09/25/2014  Falls in the past year? 0 No No No No     Depression screen [facility] 2/9 12/30/2016 08/06/2015 02/05/2015  Decreased Interest 0 0 0  Down, Depressed, Hopeless 0 0 0  PHQ - 2 Score 0 0 0    Allergies  Allergen Reactions  . Amoxicillin     rash  . Codeine     REACTION: headache    Prior to Admission medications   Medication Sig Start Date End Date Taking? Authorizing Provider  aspirin 81 MG tablet Take 81 mg by mouth every other day.    Yes [provider]  Calcium Carbonate-Vitamin D (CALCIUM-D) 600-400 MG-UNIT TABS Take by mouth 2 (two) times daily.   Yes [provider]  Cholecalciferol (VITAMIN D) 2000 UNITS CAPS Take by mouth.     Yes [provider]  fish oil-omega-3 fatty acids 1000 MG capsule Take 1 g by mouth every other day.   Yes [provider]  fluticasone (FLONASE) 50 MCG/ACT nasal spray Place 2 sprays into both nostrils daily. 12/30/16  Yes Jeffery, Chelle, PA  Garlic 962 MG TABS Take by mouth.     Yes [provider]  loratadine (CLARITIN REDITABS) 10 MG dissolvable tablet Take 10 mg by mouth daily.     Yes [provider]    losartan-hydrochlorothiazide (HYZAAR) 100-25 MG tablet Take 1 tablet by mouth  daily 12/30/16  Yes Harrison Mons, PA  meloxicam (MOBIC) 15 MG tablet Take 1 tablet by mouth  daily 12/30/16  Yes Jeffery, Domingo Mend, PA  Multiple Vitamin (MULTIVITAMIN PO) Take by mouth 1 day or 1 dose.     Yes [provider]  pantoprazole (PROTONIX) 40 MG tablet Take 1 tablet by mouth two  times daily 12/30/16  Yes Jeffery, Chelle, PA  potassium chloride SA (K-DUR,KLOR-CON) 20 MEQ tablet Take 1 tablet by mouth  daily 12/30/16  Yes Jeffery, Chelle, PA  propranolol ER (INDERAL LA) 80 MG 24 hr capsule Take 1 capsule by mouth  daily 12/30/16  Yes Jeffery, Chelle, PA  pyridOXINE (VITAMIN B-6) 100 MG tablet Take 200 mg by mouth daily.   Yes [provider]  valACYclovir (VALTREX) 1000 MG tablet Take 2 tablets AS SOON as first symptoms of fever blister appear.  Take 2 tablets 12 hours later. 12/30/16  Yes Jeffery, Domingo Mend, PA  vitamin B-12 (CYANOCOBALAMIN) 1000 MCG tablet Take 1,000 mcg by mouth daily.   Yes [provider]  zoster vaccine live, PF, (ZOSTAVAX) 22979 UNT/0.65ML injection Inject 19,400 Units into the skin once. 02/05/15  Yes Harrison Mons, PA  propranolol (INNOPRAN XL) 80 MG 24 hr capsule Take 1 capsule (80 mg total)  by mouth at bedtime. 04/21/12 12/29/12  Harrison Mons, PA    Past Medical History:  Diagnosis Date  . Abdominal pain 11/27/2010  . Accessory skin tags 01/25/2013  . Allergy   . Anemia    past hx  . Anisocoria    right eye  . Colon polyps 12/22/2010   Tubular Adenoma and Hyperplastic Polyp  . DDD (degenerative disc disease), cervical   . Elevated LFTs 11/27/2010  . Family history of colon cancer   . Fatty infiltration of liver   . Gastritis   . GERD (gastroesophageal reflux disease)   . Hiatal hernia   . HTN (hypertension)   . Hypothyroidism    with nodules  . IBS (irritable bowel syndrome)   . Muscle spasm of back 06/04/2015  . Osteoarthritis   . Pancreatitis    . Sinusitis     Past Surgical History:  Procedure Laterality Date  . ANAL FISTULECTOMY    . CHOLECYSTECTOMY    . COLONOSCOPY    . COLONOSCOPY W/ BIOPSIES    . ESOPHAGOGASTRODUODENOSCOPY    . EYE SURGERY    . PATELLA RECONSTRUCTION     pins  . POLYPECTOMY    . TUBAL LIGATION    . UPPER GASTROINTESTINAL ENDOSCOPY      Social History   Tobacco Use  . Smoking status: Former Smoker    Types: Cigarettes    Last attempt to quit: 01/26/1998    Years since quitting: 19.8  . Smokeless tobacco: Never Used  Substance Use Topics  . Alcohol use: No    Family History  Problem Relation Age of Onset  . Breast cancer Maternal Aunt 65  . Diabetes Mother   . Irritable bowel syndrome Mother   . Stroke Mother   . Heart attack Paternal Grandfather   . COPD Father        emphysema  . Cancer Father   . Alzheimer's disease Paternal Grandmother   . Alzheimer's disease Maternal Aunt   . Cancer Sister        colorectal and liver  . Colon cancer Sister   . Cancer Brother 68       colon-colorectal colon cancer  . Colon cancer Brother   . Hyperlipidemia Sister   . Hypertension Sister   . Colon polyps Sister   . Diabetes Maternal Grandmother   . Hyperlipidemia Brother        triglycerides  . Hypertension Brother   . Colon polyps Brother   . Rectal cancer Brother   . Diabetes Sister   . Liver disease Sister        fatty liver, cirrhosis  . Colon polyps Sister   . Heart attack Son 2       rare genetic condition  . Heart disease Neg Hx   . Esophageal cancer Neg Hx   . Stomach cancer Neg Hx     Review of Systems  Constitutional: Negative for chills and fever.  Respiratory: Negative for cough and shortness of breath.   Cardiovascular: Negative for chest pain, palpitations and leg swelling.  Gastrointestinal: Negative for abdominal pain, nausea and vomiting.  Musculoskeletal: Positive for joint pain.  All other systems reviewed and are negative.    OBJECTIVE:  Blood pressure  132/78, pulse (!) 59, height 5' 5.5" (1.664 m), weight 243 lb (110.2 kg), SpO2 97 %. Body mass index is 39.82 kg/m.   Physical Exam  Constitutional: She is oriented to person, place, and time. She appears well-developed and well-nourished.  HENT:  Head: Normocephalic and atraumatic.  Right Ear: Hearing, tympanic membrane, external ear and ear canal normal.  Left Ear: Hearing, tympanic membrane, external ear and ear canal normal.  Mouth/Throat: Oropharynx is clear and moist.  Eyes: Pupils are equal, round, and reactive to light. Conjunctivae and EOM are normal.  Neck: Neck supple. No thyromegaly present.  Cardiovascular: Normal rate, regular rhythm, normal heart sounds and intact distal pulses. Exam reveals no gallop and no friction rub.  No murmur heard. Pulmonary/Chest: Effort normal and breath sounds normal. She has no wheezes. She has no rales.  Abdominal: Soft. Bowel sounds are normal. She exhibits no distension and no mass. There is no tenderness.  Musculoskeletal: Normal range of motion. She exhibits no edema.  Lymphadenopathy:    She has no cervical adenopathy.  Neurological: She is alert and oriented to person, place, and time. She has normal reflexes. No cranial nerve deficit. Gait normal.  Skin: Skin is warm and dry.  Psychiatric: She has a normal mood and affect.  Nursing note and vitals reviewed.   Results for orders placed or performed in visit on 12/14/17 (from the past 24 hour(s))  POCT urinalysis dipstick     Status: None   Collection Time: 12/14/17 12:22 PM  Result Value Ref Range   Color, UA yellow yellow   Clarity, UA clear clear   Glucose, UA negative negative mg/dL   Bilirubin, UA negative negative   Ketones, POC UA negative negative mg/dL   Spec Grav, UA 1.015 1.010 - 1.025   Blood, UA negative negative   pH, UA 7.5 5.0 - 8.0   Protein Ur, POC negative negative mg/dL   Urobilinogen, UA 0.2 0.2 or 1.0 E.U./dL   Nitrite, UA Negative Negative   Leukocytes,  UA Negative Negative    My interpretation of EKG:  Sinus, HR 58, normal intervals, no ST changes  ASSESSMENT and PLAN  1. Pre-op evaluation 4. Primary osteoarthritis of right knee Patient medically optimized for this moderate risk surgery.  - CBC with Differential/Platelet - POCT urinalysis dipstick - EKG 12-Lead - Comprehensive metabolic panel - Care order/instruction:  2. Hyperglycemia - Hemoglobin A1c  3. Essential hypertension Controlled. Continue current regime.    Other orders - Flu Vaccine QUAD 36+ mos IM   Return in about 6 months (around 06/14/2018).    Rutherford Guys, MD Primary Care at [facility], [place] [address] Ph.  [phone] Fax [phone]

## 2017-12-15 LAB — COMPREHENSIVE METABOLIC PANEL
ALT: 35 IU/L — ABNORMAL HIGH (ref 0–32)
AST: 28 IU/L (ref 0–40)
Albumin/Globulin Ratio: 1.8 (ref 1.2–2.2)
Albumin: 4.6 g/dL (ref 3.6–4.8)
Alkaline Phosphatase: 85 IU/L (ref 39–117)
BUN/Creatinine Ratio: 20 (ref 12–28)
BUN: 18 mg/dL (ref 8–27)
Bilirubin Total: 0.5 mg/dL (ref 0.0–1.2)
CO2: 25 mmol/L (ref 20–29)
Calcium: 10.5 mg/dL — ABNORMAL HIGH (ref 8.7–10.3)
Chloride: 99 mmol/L (ref 96–106)
Creatinine, Ser: 0.91 mg/dL (ref 0.57–1.00)
GFR calc Af Amer: 78 mL/min/{1.73_m2} (ref >59)
GFR calc non Af Amer: 67 mL/min/{1.73_m2} (ref >59)
Globulin, Total: 2.5 g/dL (ref 1.5–4.5)
Glucose: 99 mg/dL (ref 65–99)
Potassium: 4.1 mmol/L (ref 3.5–5.2)
Sodium: 141 mmol/L (ref 134–144)
Total Protein: 7.1 g/dL (ref 6.0–8.5)

## 2017-12-16 ENCOUNTER — Encounter: Payer: Self-pay | Admitting: Family Medicine

## 2017-12-26 HISTORY — PX: REPLACEMENT TOTAL KNEE: SUR1224

## 2017-12-27 DIAGNOSIS — M25761 Osteophyte, right knee: Secondary | ICD-10-CM | POA: Diagnosis not present

## 2017-12-27 DIAGNOSIS — M1711 Unilateral primary osteoarthritis, right knee: Secondary | ICD-10-CM | POA: Diagnosis not present

## 2017-12-27 DIAGNOSIS — Z978 Presence of other specified devices: Secondary | ICD-10-CM | POA: Diagnosis not present

## 2017-12-27 DIAGNOSIS — Z96651 Presence of right artificial knee joint: Secondary | ICD-10-CM | POA: Diagnosis not present

## 2018-01-13 ENCOUNTER — Encounter: Payer: Self-pay | Admitting: Family Medicine

## 2018-01-14 ENCOUNTER — Other Ambulatory Visit: Payer: Self-pay | Admitting: *Deleted

## 2018-01-14 DIAGNOSIS — I10 Essential (primary) hypertension: Secondary | ICD-10-CM

## 2018-01-14 MED ORDER — PROPRANOLOL HCL ER 80 MG PO CP24: ORAL_CAPSULE | ORAL | 0 refills | Status: DC

## 2018-01-14 MED ORDER — LOSARTAN POTASSIUM-HCTZ 100-25 MG PO TABS: ORAL_TABLET | ORAL | 0 refills | Status: DC

## 2018-01-18 ENCOUNTER — Other Ambulatory Visit: Payer: Self-pay | Admitting: *Deleted

## 2018-01-18 DIAGNOSIS — I10 Essential (primary) hypertension: Secondary | ICD-10-CM

## 2018-01-18 MED ORDER — PROPRANOLOL HCL ER 80 MG PO CP24: ORAL_CAPSULE | ORAL | 0 refills | Status: DC

## 2018-02-09 DIAGNOSIS — Z471 Aftercare following joint replacement surgery: Secondary | ICD-10-CM | POA: Diagnosis not present

## 2018-02-09 DIAGNOSIS — Z96651 Presence of right artificial knee joint: Secondary | ICD-10-CM | POA: Diagnosis not present

## 2018-02-23 ENCOUNTER — Other Ambulatory Visit: Payer: Self-pay | Admitting: Family Medicine

## 2018-02-23 DIAGNOSIS — Z1231 Encounter for screening mammogram for malignant neoplasm of breast: Secondary | ICD-10-CM

## 2018-03-02 ENCOUNTER — Ambulatory Visit
Admission: RE | Admit: 2018-03-02 | Discharge: 2018-03-02 | Disposition: A | Discharge status: Home / Self Care | Payer: BLUE CROSS/BLUE SHIELD | Source: Ambulatory Visit

## 2018-03-02 DIAGNOSIS — Z1231 Encounter for screening mammogram for malignant neoplasm of breast: Secondary | ICD-10-CM

## 2018-04-05 ENCOUNTER — Other Ambulatory Visit: Payer: Self-pay | Admitting: Family Medicine

## 2018-04-05 DIAGNOSIS — I10 Essential (primary) hypertension: Secondary | ICD-10-CM

## 2018-04-05 NOTE — Telephone Encounter (Signed)
Requested Prescriptions  Pending Prescriptions Disp Refills  . losartan-hydrochlorothiazide (HYZAAR) 100-25 MG tablet [Pharmacy Med Name: LOSARTAN/HCTZ 100/25MG  TABLETS] 90 tablet 0    Sig: TAKE 1 TABLET BY MOUTH DAILY     Cardiovascular: ARB + Diuretic Combos Failed - 04/05/2018 11:57 AM      Failed - Ca in normal range and within 180 days    Calcium  Date Value Ref Range Status  12/14/2017 10.5 (H) 8.7 - 10.3 mg/dL Final         Passed - K in normal range and within 180 days    Potassium  Date Value Ref Range Status  12/14/2017 4.1 3.5 - 5.2 mmol/L Final         Passed - Na in normal range and within 180 days    Sodium  Date Value Ref Range Status  12/14/2017 141 134 - 144 mmol/L Final         Passed - Cr in normal range and within 180 days    Creat  Date Value Ref Range Status  08/06/2015 0.73 0.50 - 0.99 mg/dL Final    Comment:      For patients > or = 64 years of age: The upper reference limit for Creatinine is approximately 13% higher for people identified as African-American.      Creatinine, Ser  Date Value Ref Range Status  12/14/2017 0.91 0.57 - 1.00 mg/dL Final         Passed - Patient is not pregnant      Passed - Last BP in normal range    BP Readings from Last 1 Encounters:  12/14/17 132/78         Passed - Valid encounter within last 6 months    Recent Outpatient Visits          3 months ago Pre-op evaluation   [facility], Lilia Argue, MD   1 year ago Annual physical exam   Primary Care at [facility], [place], [place]   2 years ago Annual physical exam   Primary Care at [place], [place], [place]   3 years ago Essential hypertension   Primary Care at [facility], [place], [place]   3 years ago Left flank pain   Primary Care at [facility], [facility]      Future Appointments            In 2 months Rutherford Guys, MD Primary Care at [place], [facility]

## 2018-06-14 ENCOUNTER — Ambulatory Visit: Payer: BLUE CROSS/BLUE SHIELD | Admitting: Family Medicine

## 2018-06-30 ENCOUNTER — Ambulatory Visit: Payer: BC Managed Care – PPO | Admitting: Family Medicine

## 2018-06-30 ENCOUNTER — Encounter: Payer: Self-pay | Admitting: Family Medicine

## 2018-06-30 ENCOUNTER — Other Ambulatory Visit: Payer: Self-pay

## 2018-06-30 VITALS — BP 132/84 | HR 66 | Temp 98.4°F | Resp 18

## 2018-06-30 DIAGNOSIS — R2231 Localized swelling, mass and lump, right upper limb: Secondary | ICD-10-CM

## 2018-06-30 DIAGNOSIS — Z23 Encounter for immunization: Secondary | ICD-10-CM | POA: Diagnosis not present

## 2018-06-30 DIAGNOSIS — G8929 Other chronic pain: Secondary | ICD-10-CM

## 2018-06-30 DIAGNOSIS — I10 Essential (primary) hypertension: Secondary | ICD-10-CM

## 2018-06-30 DIAGNOSIS — M25561 Pain in right knee: Secondary | ICD-10-CM

## 2018-06-30 DIAGNOSIS — R739 Hyperglycemia, unspecified: Secondary | ICD-10-CM | POA: Diagnosis not present

## 2018-06-30 DIAGNOSIS — K219 Gastro-esophageal reflux disease without esophagitis: Secondary | ICD-10-CM

## 2018-06-30 MED ORDER — LOSARTAN POTASSIUM-HCTZ 100-25 MG PO TABS: 1.0000 | ORAL_TABLET | Freq: Every day | ORAL | 0 refills | Status: DC

## 2018-06-30 MED ORDER — MELOXICAM 15 MG PO TABS: ORAL_TABLET | ORAL | 3 refills | Status: DC

## 2018-06-30 MED ORDER — PROPRANOLOL HCL ER 80 MG PO CP24: ORAL_CAPSULE | ORAL | 0 refills | Status: DC

## 2018-06-30 MED ORDER — POTASSIUM CHLORIDE CRYS ER 20 MEQ PO TBCR: EXTENDED_RELEASE_TABLET | ORAL | 3 refills | Status: DC

## 2018-06-30 MED ORDER — PANTOPRAZOLE SODIUM 40 MG PO TBEC: DELAYED_RELEASE_TABLET | ORAL | 3 refills | Status: DC

## 2018-06-30 NOTE — Progress Notes (Signed)
6/4/20209:27 AM  Catherine Navarro 01-13-1956, 63 y.o., female [id number]  Chief Complaint  Patient presents with  . Follow-up    chronic condition, wanting to talk about getting a tetanus shot  . Medication Refill    losartan, potassium, protonix, mobic, propanolol    HPI:   Patient is a 63 y.o. female with past medical history significant for HTN, GERD, pre-diabetes, OA right knee  who presents today for routine followup  Last OV Nov 2019 for preop for R TKA She reports that she has been doing well from her surgery - takes meloxican as needed She is requesting td booster today Has not gotten her shingles vaccine yet She has been taking care of her sister who had a kidney removal  She walks for about a mile and half a day Loss weight with surgery has regained some back but not all gerd well controlled on PPI  She has noticed a non painful lump at base of 4th finger, right hand. Denies any trigger finger. Has been growing. She is left handed  Needs to schedule her WWE with Dr Glo Herring at [place] - due for pap, mammo UTD  Due for colonoscopy this year, has received reminder already  Lab Results  Component Value Date   HGBA1C 5.7 (H) 12/14/2017   HGBA1C 5.7 08/06/2015   HGBA1C 5.5 02/05/2015   Lab Results  Component Value Date   LDLCALC 78 12/30/2016   CREATININE 0.91 12/14/2017    Most Recent Immunizations  Administered Date(s) Administered  . Influenza Split 11/17/2013  . Influenza,inj,Quad PF,6+ Mos 12/14/2017  . Influenza-Unspecified 11/21/2014  . Tdap 01/12/2008    Fall Risk  06/30/2018 12/14/2017 12/14/2017 12/30/2016 08/06/2015  Falls in the past year? 0 0 0 No No  Number falls in past yr: 0 - - - -  Injury with Fall? 0 - - - -     Depression screen [facility] 2/9 06/30/2018 12/14/2017 12/30/2016  Decreased Interest 0 0 0  Down, Depressed, Hopeless 0 0 0  PHQ - 2 Score 0 0 0    Allergies  Allergen Reactions  . Other Other (See Comments)  . Amoxicillin      rash  . Codeine     REACTION: headache    Prior to Admission medications   Medication Sig Start Date End Date Taking? Authorizing Provider  aspirin 81 MG tablet Take 81 mg by mouth every other day.    Yes [provider]  Calcium Carbonate-Vitamin D (CALCIUM-D) 600-400 MG-UNIT TABS Take by mouth 2 (two) times daily.   Yes [provider]  Cholecalciferol (VITAMIN D) 2000 UNITS CAPS Take by mouth.     Yes [provider]  fish oil-omega-3 fatty acids 1000 MG capsule Take 1 g by mouth every other day.   Yes [provider]  fluticasone (FLONASE) 50 MCG/ACT nasal spray Place 2 sprays into both nostrils daily. 12/30/16  Yes Jeffery, Chelle, PA  Garlic 791 MG TABS Take by mouth.     Yes [provider]  loratadine (CLARITIN REDITABS) 10 MG dissolvable tablet Take 10 mg by mouth daily.     Yes [provider]  losartan-hydrochlorothiazide (HYZAAR) 100-25 MG tablet TAKE 1 TABLET BY MOUTH DAILY 04/05/18  Yes Rutherford Guys, MD  meloxicam [facility]) 15 MG tablet Take 1 tablet by mouth  daily 12/30/16  Yes Jeffery, Domingo Mend, PA  Multiple Vitamin (MULTIVITAMIN PO) Take by mouth 1 day or 1 dose.     Yes [provider]  pantoprazole (PROTONIX) 40 MG tablet Take 1 tablet by mouth two  times daily 12/30/16  Yes Jeffery, Chelle, PA  potassium chloride SA (K-DUR,KLOR-CON) 20 MEQ tablet Take 1 tablet by mouth  daily 12/30/16  Yes Jeffery, Chelle, PA  propranolol ER (INDERAL LA) 80 MG 24 hr capsule Take 1 capsule by mouth  daily 01/18/18  Yes Rutherford Guys, MD  pyridOXINE (VITAMIN B-6) 100 MG tablet Take 200 mg by mouth daily.   Yes [provider]  vitamin B-12 (CYANOCOBALAMIN) 1000 MCG tablet Take 1,000 mcg by mouth daily.   Yes [provider]  zoster vaccine live, PF, (ZOSTAVAX) 08657 UNT/0.65ML injection Inject 19,400 Units into the skin once. 02/05/15  Yes Jeffery, Chelle, PA  propranolol (INNOPRAN XL) 80 MG 24 hr capsule Take  1 capsule (80 mg total) by mouth at bedtime. 04/21/12 12/29/12  Harrison Mons, PA    Past Medical History:  Diagnosis Date  . Abdominal pain 11/27/2010  . Accessory skin tags 01/25/2013  . Allergy   . Anemia    past hx  . Anisocoria    right eye  . Colon polyps 12/22/2010   Tubular Adenoma and Hyperplastic Polyp  . DDD (degenerative disc disease), cervical   . Elevated LFTs 11/27/2010  . Family history of colon cancer   . Fatty infiltration of liver   . Gastritis   . GERD (gastroesophageal reflux disease)   . Hiatal hernia   . HTN (hypertension)   . Hypothyroidism    with nodules  . IBS (irritable bowel syndrome)   . Muscle spasm of back 06/04/2015  . Osteoarthritis   . Pancreatitis   . Sinusitis     Past Surgical History:  Procedure Laterality Date  . ANAL FISTULECTOMY    . CHOLECYSTECTOMY    . COLONOSCOPY    . COLONOSCOPY W/ BIOPSIES    . ESOPHAGOGASTRODUODENOSCOPY    . EYE SURGERY    . PATELLA RECONSTRUCTION     pins  . POLYPECTOMY    . TUBAL LIGATION    . UPPER GASTROINTESTINAL ENDOSCOPY      Social History   Tobacco Use  . Smoking status: Former Smoker    Types: Cigarettes    Last attempt to quit: 01/26/1998    Years since quitting: 20.4  . Smokeless tobacco: Never Used  Substance Use Topics  . Alcohol use: No    Family History  Problem Relation Age of Onset  . Breast cancer Maternal Aunt 65  . Diabetes Mother   . Irritable bowel syndrome Mother   . Stroke Mother   . Heart attack Paternal Grandfather   . COPD Father        emphysema  . Cancer Father   . Alzheimer's disease Paternal Grandmother   . Alzheimer's disease Maternal Aunt   . Cancer Sister        colorectal and liver  . Colon cancer Sister   . Cancer Brother 66       colon-colorectal colon cancer  . Colon cancer Brother   . Hyperlipidemia Sister   . Hypertension Sister   . Colon polyps Sister   . Diabetes Maternal Grandmother   . Hyperlipidemia Brother        triglycerides  .  Hypertension Brother   . Colon polyps Brother   . Rectal cancer Brother   . Diabetes Sister   . Liver disease Sister        fatty liver, cirrhosis  . Colon polyps Sister   .  Heart attack Son 27       rare genetic condition  . Heart disease Neg Hx   . Esophageal cancer Neg Hx   . Stomach cancer Neg Hx     Review of Systems  Constitutional: Negative for chills and fever.  Respiratory: Negative for cough and shortness of breath.   Cardiovascular: Negative for chest pain, palpitations and leg swelling.  Gastrointestinal: Positive for constipation (manages with fiber and probiotics). Negative for abdominal pain, nausea and vomiting.     OBJECTIVE:  Today's Vitals   06/30/18 0857  BP: 132/84  Pulse: 66  Resp: 18  Temp: 98.4 F (36.9 C)  TempSrc: Oral  SpO2: 96%   There is no height or weight on file to calculate BMI.   Physical Exam Vitals signs and nursing note reviewed.  Constitutional:      Appearance: She is well-developed.  HENT:     Head: Normocephalic and atraumatic.     Mouth/Throat:     Pharynx: No oropharyngeal exudate.  Eyes:     General: No scleral icterus.    Conjunctiva/sclera: Conjunctivae normal.     Pupils: Pupils are equal, round, and reactive to light.  Neck:     Musculoskeletal: Neck supple.  Cardiovascular:     Rate and Rhythm: Normal rate and regular rhythm.     Heart sounds: Normal heart sounds. No murmur. No friction rub. No gallop.   Pulmonary:     Effort: Pulmonary effort is normal.     Breath sounds: Normal breath sounds. No wheezing or rales.  Musculoskeletal:       Hands:     Right lower leg: No edema.     Left lower leg: No edema.  Skin:    General: Skin is warm and dry.  Neurological:     Mental Status: She is alert and oriented to person, place, and time.     ASSESSMENT and PLAN  1. Essential hypertension Controlled. Continue current regime.  - Lipid panel - TSH - CMP14+EGFR - propranolol ER (INDERAL LA) 80 MG 24 hr  capsule; Take 1 capsule by mouth  daily - potassium chloride SA (K-DUR) 20 MEQ tablet; Take 1 tablet by mouth  daily - losartan-hydrochlorothiazide (HYZAAR) 100-25 MG tablet; Take 1 tablet by mouth daily.  2. Hyperglycemia Discussed LFM - Hemoglobin A1c  3. Hypercalcemia Checking ionized calcium, discussed limiting supplement intake, HCTZ?  - Lipid panel - TSH - CMP14+EGFR - Calcium, ionized  4. Mass of right hand - [place] RT UPPER EXTREM LTD SOFT TISSUE NON VASCULAR; Future  5. Gastroesophageal reflux disease, esophagitis presence not specified - pantoprazole (PROTONIX) 40 MG tablet; Take 1 tablet by mouth two  times daily  6. Chronic pain of right knee - meloxicam (MOBIC) 15 MG tablet; Take 1 tablet by mouth  daily  Other orders - Td vaccine greater than or equal to 7yo preservative free IM  Return in about 6 months (around 12/30/2018).    Rutherford Guys, MD Primary Care at [facility], [place] [address] Ph.  [phone] Fax [phone]

## 2018-06-30 NOTE — Patient Instructions (Addendum)
If you have lab work done today you will be contacted with your lab results within the next 2 weeks.  If you have not heard from [place] then please contact us. The fastest way to get your results is to register for My Chart.   IF you received an x-ray today, you will receive an invoice from [facility]. Please contact [facility] at [phone] with questions or concerns regarding your invoice.   IF you received labwork today, you will receive an invoice from [facility]. Please contact [facility] at [phone] with questions or concerns regarding your invoice.   Our billing staff will not be able to assist you with questions regarding bills from these companies.  You will be contacted with the lab results as soon as they are available. The fastest way to get your results is to activate your My Chart account. Instructions are located on the last page of this paperwork. If you have not heard from [place] regarding the results in 2 weeks, please contact this office.     How to Increase Your Level of Physical Activity  Getting regular physical activity is important for your overall health and well-being. Most people do not get enough exercise. There are easy ways to increase your level of physical activity, even if you have not been very active in the past or you are just starting out. Why is physical activity important? Physical activity has many short-term and long-term health benefits. Regular exercise can:  Help you lose weight or maintain a healthy weight.  Strengthen your muscles and bones.  Boost your mood and improve self-esteem.  Reduce your risk of certain long-term (chronic) diseases, like heart disease, cancer, and diabetes.  Help you stay capable of walking and moving around (mobile) as you age.  Prevent accidents, such as falls, as you age.  Increase life expectancy. What are the benefits of being physically active on a regular basis? In addition to  improving your physical health, being physically active on most days of the week can help you in ways that you may not expect. Benefits of regular physical activity may include:  Feeling good about your body.  Being able to move around more easily and for longer periods of time without getting tired (increased stamina).  Finding new sources of fun and enjoyment.  Meeting new people who share a common interest.  Being able to fight off illness better (enhanced immunity).  Being able to sleep better. What can happen if I am not physically active on a regular basis? Not getting enough physical activity can lead to an unhealthy lifestyle and future health problems. This can increase your chances of:  Becoming overweight or obese.  Becoming sick.  Developing chronic illnesses, like heart disease or diabetes.  Having mental health problems, like depression or anxiety.  Having sleep problems.  Having trouble walking or getting yourself around (reduced mobility).  Injuring yourself in a fall as you get older. What steps can I take to be more physically active?  Check with your health care provider about how to get started. Ask your health care provider what activities are safe for you.  Start out slowly. Walking or doing some simple chair exercises is a good place to start, especially if you have not been active before or for a long time.  Try to find activities that you enjoy. You are more likely to commit to an exercise routine if it does not feel like a chore.  If you  have bone or joint problems, choose low-impact exercises, like walking or swimming.  Include physical activity in your everyday routine.  Invite friends or family members to exercise with you. This also will help you commit to your workout plan.  Set goals that you can work toward.  Aim for at least 150 minutes of moderate-intensity exercise each week. Examples of moderate-intensity exercise include walking or  riding a bike. Where to find more information  Centers for Disease Control and Prevention: BowlingGrip.is  President's Council on Fitness, Sports & Nutrition www.http://villegas.org/  ChooseMyPlate: WirelessM[employer]gages.dk Contact a health care provider if:  You have headaches, muscle aches, or joint pain.  You feel dizzy or light-headed while exercising.  You faint.  You have chest pain while exercising. Summary  Exercise benefits your mind and body at any age, even if you are just starting out.  If you have a chronic illness or have not been active for a while, check with your health care provider before increasing your physical activity.  Choose activities that are safe and enjoyable for you.Ask your health care provider what activities are safe for you.  Start slowly. Tell your health care provider if you have problems as you start to increase your activity level. This information is not intended to replace advice given to you by your health care provider. Make sure you discuss any questions you have with your health care provider. Document Released: 01/02/2016 Document Revised: 01/02/2016 Document Reviewed: 01/02/2016 Elsevier Interactive Patient Education  2019 Elsevier Inc.  Prediabetes Eating Plan Prediabetes is a condition that causes blood sugar (glucose) levels to be higher than normal. This increases the risk for developing diabetes. In order to prevent diabetes from developing, your health care provider may recommend a diet and other lifestyle changes to help you:  Control your blood glucose levels.  Improve your cholesterol levels.  Manage your blood pressure. Your health care provider may recommend working with a diet and nutrition specialist (dietitian) to make a meal plan that is best for you. What are tips for following this plan? Lifestyle  Set weight loss goals with the help of your health care team. It is  recommended that most people with prediabetes lose 7% of their current body weight.  Exercise for at least 30 minutes at least 5 days a week.  Attend a support group or seek ongoing support from a mental health counselor.  Take over-the-counter and prescription medicines only as told by your health care provider. Reading food labels  Read food labels to check the amount of fat, salt (sodium), and sugar in prepackaged foods. Avoid foods that have: ? Saturated fats. ? Trans fats. ? Added sugars.  Avoid foods that have more than 300 milligrams (mg) of sodium per serving. Limit your daily sodium intake to less than 2,300 mg each day. Shopping  Avoid buying pre-made and processed foods. Cooking  Cook with olive oil. Do not use butter, lard, or ghee.  Bake, broil, grill, or boil foods. Avoid frying. Meal planning   Work with your dietitian to develop an eating plan that is right for you. This may include: ? Tracking how many calories you take in. Use a food diary, notebook, or mobile application to track what you eat at each meal. ? Using the glycemic index (GI) to plan your meals. The index tells you how quickly a food will raise your blood glucose. Choose low-GI foods. These foods take a longer time to raise blood glucose.  Consider following a Mediterranean  diet. This diet includes: ? Several servings each day of fresh fruits and vegetables. ? Eating fish at least twice a week. ? Several servings each day of whole grains, beans, nuts, and seeds. ? Using olive oil instead of other fats. ? Moderate alcohol consumption. ? Eating small amounts of red meat and whole-fat dairy.  If you have high blood pressure, you may need to limit your sodium intake or follow a diet such as the DASH eating plan. DASH is an eating plan that aims to lower high blood pressure. What foods are recommended? The items listed below may not be a complete list. Talk with your dietitian about what dietary  choices are best for you. Grains Whole grains, such as whole-wheat or whole-grain breads, crackers, cereals, and pasta. Unsweetened oatmeal. Bulgur. Barley. Quinoa. Brown rice. Corn or whole-wheat flour tortillas or taco shells. Vegetables Lettuce. Spinach. Peas. Beets. Cauliflower. Cabbage. Broccoli. Carrots. Tomatoes. Squash. Eggplant. Herbs. Peppers. Onions. Cucumbers. Brussels sprouts. Fruits Berries. Bananas. Apples. Oranges. Grapes. Papaya. Mango. Pomegranate. Kiwi. Grapefruit. Cherries. Meats and other protein foods Seafood. Poultry without skin. Lean cuts of pork and beef. Tofu. Eggs. Nuts. Beans. Dairy Low-fat or fat-free dairy products, such as yogurt, cottage cheese, and cheese. Beverages Water. Tea. Coffee. Sugar-free or diet soda. Seltzer water. Lowfat or no-fat milk. Milk alternatives, such as soy or almond milk. Fats and oils Olive oil. Canola oil. Sunflower oil. Grapeseed oil. Avocado. Walnuts. Sweets and desserts Sugar-free or low-fat pudding. Sugar-free or low-fat ice cream and other frozen treats. Seasoning and other foods Herbs. Sodium-free spices. Mustard. Relish. Low-fat, low-sugar ketchup. Low-fat, low-sugar barbecue sauce. Low-fat or fat-free mayonnaise. What foods are not recommended? The items listed below may not be a complete list. Talk with your dietitian about what dietary choices are best for you. Grains Refined white flour and flour products, such as bread, pasta, snack foods, and cereals. Vegetables Canned vegetables. Frozen vegetables with butter or cream sauce. Fruits Fruits canned with syrup. Meats and other protein foods Fatty cuts of meat. Poultry with skin. Breaded or fried meat. Processed meats. Dairy Full-fat yogurt, cheese, or milk. Beverages Sweetened drinks, such as sweet iced tea and soda. Fats and oils Butter. Lard. Ghee. Sweets and desserts Baked goods, such as cake, cupcakes, pastries, cookies, and cheesecake. Seasoning and other  foods Spice mixes with added salt. Ketchup. Barbecue sauce. Mayonnaise. Summary  To prevent diabetes from developing, you may need to make diet and other lifestyle changes to help control blood sugar, improve cholesterol levels, and manage your blood pressure.  Set weight loss goals with the help of your health care team. It is recommended that most people with prediabetes lose 7 percent of their current body weight.  Consider following a Mediterranean diet that includes plenty of fresh fruits and vegetables, whole grains, beans, nuts, seeds, fish, lean meat, low-fat dairy, and healthy oils. This information is not intended to replace advice given to you by your health care provider. Make sure you discuss any questions you have with your health care provider. Document Released: 05/29/2014 Document Revised: 03/18/2016 Document Reviewed: 03/18/2016 Elsevier Interactive Patient Education  2019 [employer].

## 2018-07-01 LAB — CMP14+EGFR
ALT: 25 IU/L (ref 0–32)
AST: 23 IU/L (ref 0–40)
Albumin/Globulin Ratio: 1.6 (ref 1.2–2.2)
Albumin: 4.3 g/dL (ref 3.8–4.8)
Alkaline Phosphatase: 100 IU/L (ref 39–117)
BUN/Creatinine Ratio: 23 (ref 12–28)
BUN: 19 mg/dL (ref 8–27)
Bilirubin Total: 0.4 mg/dL (ref 0.0–1.2)
CO2: 23 mmol/L (ref 20–29)
Calcium: 10.5 mg/dL — ABNORMAL HIGH (ref 8.7–10.3)
Chloride: 102 mmol/L (ref 96–106)
Creatinine, Ser: 0.81 mg/dL (ref 0.57–1.00)
GFR calc Af Amer: 89 mL/min/{1.73_m2} (ref >59)
GFR calc non Af Amer: 78 mL/min/{1.73_m2} (ref >59)
Globulin, Total: 2.7 g/dL (ref 1.5–4.5)
Glucose: 110 mg/dL — ABNORMAL HIGH (ref 65–99)
Potassium: 4.1 mmol/L (ref 3.5–5.2)
Sodium: 140 mmol/L (ref 134–144)
Total Protein: 7 g/dL (ref 6.0–8.5)

## 2018-07-01 LAB — LIPID PANEL
Chol/HDL Ratio: 2.5 ratio (ref 0.0–4.4)
Cholesterol, Total: 174 mg/dL (ref 100–199)
HDL: 69 mg/dL (ref >39)
LDL Calculated: 85 mg/dL (ref 0–99)
Triglycerides: 101 mg/dL (ref 0–149)
VLDL Cholesterol Cal: 20 mg/dL (ref 5–40)

## 2018-07-01 LAB — TSH: TSH: 1.45 u[IU]/mL (ref 0.450–4.500)

## 2018-07-02 LAB — HEMOGLOBIN A1C
Est. average glucose Bld gHb Est-mCnc: 114 mg/dL
Hgb A1c MFr Bld: 5.6 % (ref 4.8–5.6)

## 2018-07-02 LAB — CALCIUM, IONIZED: Calcium, Ion: 5.4 mg/dL (ref 4.5–5.6)

## 2018-07-14 ENCOUNTER — Encounter: Payer: Self-pay | Admitting: Family Medicine

## 2018-08-24 ENCOUNTER — Ambulatory Visit
Admission: RE | Admit: 2018-08-24 | Discharge: 2018-08-24 | Disposition: A | Discharge status: Home / Self Care | Payer: BC Managed Care – PPO | Source: Ambulatory Visit | Attending: Family Medicine | Admitting: Family Medicine

## 2018-08-24 DIAGNOSIS — R2231 Localized swelling, mass and lump, right upper limb: Secondary | ICD-10-CM

## 2018-09-28 ENCOUNTER — Other Ambulatory Visit: Payer: Self-pay | Admitting: Family Medicine

## 2018-09-28 DIAGNOSIS — I10 Essential (primary) hypertension: Secondary | ICD-10-CM

## 2018-09-29 ENCOUNTER — Other Ambulatory Visit: Payer: Self-pay | Admitting: Family Medicine

## 2018-09-29 DIAGNOSIS — I10 Essential (primary) hypertension: Secondary | ICD-10-CM

## 2018-12-15 ENCOUNTER — Encounter: Payer: Self-pay | Admitting: *Deleted

## 2018-12-28 DIAGNOSIS — Z471 Aftercare following joint replacement surgery: Secondary | ICD-10-CM | POA: Diagnosis not present

## 2018-12-28 DIAGNOSIS — Z96651 Presence of right artificial knee joint: Secondary | ICD-10-CM | POA: Diagnosis not present

## 2018-12-29 ENCOUNTER — Encounter: Payer: Self-pay | Admitting: Family Medicine

## 2018-12-29 ENCOUNTER — Ambulatory Visit: Payer: BC Managed Care – PPO | Admitting: Family Medicine

## 2018-12-29 ENCOUNTER — Other Ambulatory Visit: Payer: Self-pay

## 2018-12-29 VITALS — BP 132/84 | HR 61 | Temp 97.9°F | Ht 65.5 in | Wt 243.0 lb

## 2018-12-29 DIAGNOSIS — R739 Hyperglycemia, unspecified: Secondary | ICD-10-CM | POA: Diagnosis not present

## 2018-12-29 DIAGNOSIS — Z23 Encounter for immunization: Secondary | ICD-10-CM

## 2018-12-29 DIAGNOSIS — Z8639 Personal history of other endocrine, nutritional and metabolic disease: Secondary | ICD-10-CM

## 2018-12-29 DIAGNOSIS — K219 Gastro-esophageal reflux disease without esophagitis: Secondary | ICD-10-CM | POA: Diagnosis not present

## 2018-12-29 DIAGNOSIS — G8929 Other chronic pain: Secondary | ICD-10-CM

## 2018-12-29 DIAGNOSIS — I10 Essential (primary) hypertension: Secondary | ICD-10-CM

## 2018-12-29 DIAGNOSIS — M25561 Pain in right knee: Secondary | ICD-10-CM

## 2018-12-29 MED ORDER — POTASSIUM CHLORIDE CRYS ER 20 MEQ PO TBCR: EXTENDED_RELEASE_TABLET | ORAL | 3 refills | Status: DC

## 2018-12-29 MED ORDER — PROPRANOLOL HCL ER 80 MG PO CP24: ORAL_CAPSULE | ORAL | 1 refills | Status: DC

## 2018-12-29 MED ORDER — MELOXICAM 15 MG PO TABS: 15.0000 mg | ORAL_TABLET | Freq: Every day | ORAL | 1 refills | Status: DC | PRN

## 2018-12-29 MED ORDER — PANTOPRAZOLE SODIUM 40 MG PO TBEC: DELAYED_RELEASE_TABLET | ORAL | 3 refills | Status: DC

## 2018-12-29 MED ORDER — LOSARTAN POTASSIUM-HCTZ 100-25 MG PO TABS: ORAL_TABLET | ORAL | 1 refills | Status: DC

## 2018-12-29 NOTE — Patient Instructions (Signed)
° ° ° °  If you have lab work done today you will be contacted with your lab results within the next 2 weeks.  If you have not heard from us then please contact us. The fastest way to get your results is to register for My Chart. ° ° °IF you received an x-ray today, you will receive an invoice from [facility]. Please contact [facility] at [phone] with questions or concerns regarding your invoice.  ° °IF you received labwork today, you will receive an invoice from [facility]. Please contact [facility] at [phone] with questions or concerns regarding your invoice.  ° °Our billing staff will not be able to assist you with questions regarding bills from these companies. ° °You will be contacted with the lab results as soon as they are available. The fastest way to get your results is to activate your My Chart account. Instructions are located on the last page of this paperwork. If you have not heard from us regarding the results in 2 weeks, please contact this office. °  ° ° ° °

## 2018-12-29 NOTE — Progress Notes (Signed)
12/3/20209:03 AM  Catherine Navarro [birth year], 63 y.o., female [id number]  Chief Complaint  Patient presents with  . Follow-up    6 month follow up, having slight earache in right ear    HPI:   Patient is a 63 y.o. female with past medical history significant for HTN, GERD, pre-diabetes, OA right knee, b12 deficiency who presents today for routine followup  Last OV June 2020 - no changes Had right TKA, doing really well Now takes meloxicam about 2-3 x week Has resumed walking daily   Takes her BP meds daily wo any issues Has been on K supplement for years  Had to increase PPI to BID as she was having nighttime coughing and increased secretions Since then these symptoms have resolved  Pap, mammo and colonoscopy scheduled in Jan 2021  Has h/o b12 deficiency Takes mvit and b12 1049mcg a day    Lab Results  Component Value Date   HGBA1C 5.6 06/30/2018   Lab Results  Component Value Date   CHOL 174 06/30/2018   HDL 69 06/30/2018   LDLCALC 85 06/30/2018   TRIG 101 06/30/2018   CHOLHDL 2.5 06/30/2018   Lab Results  Component Value Date   CREATININE 0.81 06/30/2018   BUN 19 06/30/2018   NA 140 06/30/2018   K 4.1 06/30/2018   CL 102 06/30/2018   CO2 23 06/30/2018    Depression screen PHQ 2/9 12/29/2018 06/30/2018 12/14/2017  Decreased Interest 0 0 0  Down, Depressed, Hopeless 0 0 0  PHQ - 2 Score 0 0 0    Fall Risk  12/29/2018 06/30/2018 12/14/2017 12/14/2017 12/30/2016  Falls in the past year? 0 0 0 0 No  Number falls in past yr: 0 0 - - -  Injury with Fall? 0 0 - - -     Allergies  Allergen Reactions  . Other Other (See Comments)  . Amoxicillin     rash  . Codeine     REACTION: headache    Prior to Admission medications   Medication Sig Start Date End Date Taking? Authorizing Provider  aspirin 81 MG tablet Take 81 mg by mouth every other day.    Yes [provider]  Calcium Carbonate-Vitamin D (CALCIUM-D) 600-400 MG-UNIT TABS Take by mouth  2 (two) times daily.   Yes [provider]  Cholecalciferol (VITAMIN D) 2000 UNITS CAPS Take by mouth.     Yes [provider]  fish oil-omega-3 fatty acids 1000 MG capsule Take 1 g by mouth every other day.   Yes [provider]  fluticasone (FLONASE) 50 MCG/ACT nasal spray Place 2 sprays into both nostrils daily. 12/30/16  Yes Jeffery, Chelle, PA  Garlic [id number] MG TABS Take by mouth.     Yes [provider]  loratadine (CLARITIN REDITABS) 10 MG dissolvable tablet Take 10 mg by mouth daily.     Yes [provider]  losartan-hydrochlorothiazide (HYZAAR) 100-25 MG tablet TAKE 1 TABLET BY MOUTH DAILY GENERIC EQUIVALENT FOR HYZAAR 09/28/18  Yes Rutherford Guys, MD  meloxicam [facility]) 15 MG tablet Take 1 tablet by mouth  daily 06/30/18  Yes Rutherford Guys, MD  Multiple Vitamin (MULTIVITAMIN PO) Take by mouth 1 day or 1 dose.     Yes [provider]  pantoprazole (PROTONIX) 40 MG tablet Take 1 tablet by mouth two  times daily 06/30/18  Yes Rutherford Guys, MD  potassium chloride SA (K-DUR) 20 MEQ tablet Take 1 tablet by mouth  daily  06/30/18  Yes Rutherford Guys, MD  propranolol ER (INDERAL LA) 80 MG 24 hr capsule TAKE 1 CAPSULE BY MOUTH EVERY DAY 09/29/18  Yes Rutherford Guys, MD  pyridOXINE (VITAMIN B-6) 100 MG tablet Take 200 mg by mouth daily.   Yes [provider]  vitamin B-12 (CYANOCOBALAMIN) 1000 MCG tablet Take 1,000 mcg by mouth daily.   Yes [provider]  zoster vaccine live, PF, (ZOSTAVAX) 60454 UNT/0.65ML injection Inject 19,400 Units into the skin once. 02/05/15  Yes Jeffery, Chelle, PA  propranolol (INNOPRAN XL) 80 MG 24 hr capsule Take 1 capsule (80 mg total) by mouth at bedtime. 04/21/12 12/29/12  Harrison Mons, PA    Past Medical History:  Diagnosis Date  . Abdominal pain 11/27/2010  . Accessory skin tags 01/25/2013  . Allergy   . Anemia    past hx  . Anisocoria    right eye  . Colon polyps 12/22/2010   Tubular  Adenoma and Hyperplastic Polyp  . DDD (degenerative disc disease), cervical   . Elevated LFTs 11/27/2010  . Family history of colon cancer   . Fatty infiltration of liver   . Gastritis   . GERD (gastroesophageal reflux disease)   . Hiatal hernia   . HTN (hypertension)   . Hypothyroidism    with nodules  . IBS (irritable bowel syndrome)   . Muscle spasm of back 06/04/2015  . Osteoarthritis   . Pancreatitis   . Sinusitis     Past Surgical History:  Procedure Laterality Date  . ANAL FISTULECTOMY    . CHOLECYSTECTOMY    . COLONOSCOPY    . COLONOSCOPY W/ BIOPSIES    . ESOPHAGOGASTRODUODENOSCOPY    . EYE SURGERY    . PATELLA RECONSTRUCTION     pins  . POLYPECTOMY    . TUBAL LIGATION    . UPPER GASTROINTESTINAL ENDOSCOPY      Social History   Tobacco Use  . Smoking status: Former Smoker    Types: Cigarettes    Quit date: 01/26/1998    Years since quitting: 20.9  . Smokeless tobacco: Never Used  Substance Use Topics  . Alcohol use: No    Family History  Problem Relation Age of Onset  . Breast cancer Maternal Aunt 65  . Diabetes Mother   . Irritable bowel syndrome Mother   . Stroke Mother   . Heart attack Paternal Grandfather   . COPD Father        emphysema  . Cancer Father   . Alzheimer's disease Paternal Grandmother   . Alzheimer's disease Maternal Aunt   . Colon cancer Sister   . Liver cancer Sister   . Cancer Brother 50       colon-colorectal colon cancer  . Colon cancer Brother   . Hyperlipidemia Sister   . Hypertension Sister   . Colon polyps Sister   . Diabetes Maternal Grandmother   . Hyperlipidemia Brother        triglycerides  . Hypertension Brother   . Colon polyps Brother   . Rectal cancer Brother   . Diabetes Sister   . Liver disease Sister        fatty liver, cirrhosis  . Colon polyps Sister   . Heart attack Son 64       rare genetic condition  . Heart disease Neg Hx   . Esophageal cancer Neg Hx   . Stomach cancer Neg Hx     Review of  Systems  Constitutional: Negative for  chills and fever.  Respiratory: Negative for cough and shortness of breath.   Cardiovascular: Negative for chest pain, palpitations and leg swelling.  Gastrointestinal: Negative for abdominal pain, blood in stool, melena, nausea and vomiting.  Neurological: Negative for tingling.  per hpi   OBJECTIVE:  Today's Vitals   12/29/18 0859  BP: 132/84  Pulse: 61  Temp: 97.9 F (36.6 C)  SpO2: 99%  Weight: 243 lb (110.2 kg)  Height: 5' 5.5" (1.664 m)   Body mass index is 39.82 kg/m.  Wt Readings from Last 3 Encounters:  12/29/18 243 lb (110.2 kg)  12/14/17 243 lb (110.2 kg)  12/30/16 242 lb 6.4 oz (110 kg)    Physical Exam Vitals signs and nursing note reviewed.  Constitutional:      Appearance: She is well-developed.  HENT:     Head: Normocephalic and atraumatic.     Mouth/Throat:     Pharynx: No oropharyngeal exudate.  Eyes:     General: No scleral icterus.    Conjunctiva/sclera: Conjunctivae normal.     Pupils: Pupils are equal, round, and reactive to light.  Neck:     Musculoskeletal: Neck supple.  Cardiovascular:     Rate and Rhythm: Normal rate and regular rhythm.     Heart sounds: Normal heart sounds. No murmur. No friction rub. No gallop.   Pulmonary:     Effort: Pulmonary effort is normal.     Breath sounds: Normal breath sounds. No wheezing or rales.  Musculoskeletal:     Right lower leg: No edema.     Left lower leg: No edema.  Skin:    General: Skin is warm and dry.  Neurological:     Mental Status: She is alert and oriented to person, place, and time.     No results found for this or any previous visit (from the past 24 hour(s)).  No results found.   ASSESSMENT and PLAN  1. Essential hypertension Controlled. Continue current regime.  - Basic Metabolic Panel - losartan-hydrochlorothiazide (HYZAAR) 100-25 MG tablet; TAKE 1 TABLET BY MOUTH DAILY GENERIC EQUIVALENT FOR HYZAAR - propranolol ER (INDERAL LA) 80  MG 24 hr capsule; TAKE 1 CAPSULE BY MOUTH EVERY DAY - potassium chloride SA (KLOR-CON) 20 MEQ tablet; Take 1 tablet by mouth  daily  2. Chronic pain of right knee Stable. Continue current regime.  - meloxicam (MOBIC) 15 MG tablet; Take 1 tablet (15 mg total) by mouth daily as needed for pain. Take 1 tablet by mouth  daily  3. Gastroesophageal reflux disease without esophagitis Controlled. Continue current regime.  - pantoprazole (PROTONIX) 40 MG tablet; Take 1 tablet by mouth two  times daily  4. H/O non anemic vitamin B12 deficiency Checking labs today, medications will be adjusted as needed.  - Vitamin B12  5. Hyperglycemia Labs pending. Cont with LFM - Hemoglobin A1c  6. H/O hypokalemia Checking labs today, medications will be adjusted as needed.  - Basic metabolic panel  7. Need for prophylactic vaccination and inoculation against influenza - Flu Vaccine  Return in about 6 months (around 06/29/2019).    Rutherford Guys, MD Primary [facility] at [address], [place] [address] Ph.  [phone] Fax [phone]

## 2018-12-30 LAB — BASIC METABOLIC PANEL
BUN/Creatinine Ratio: 18 (ref 12–28)
BUN: 13 mg/dL (ref 8–27)
CO2: 24 mmol/L (ref 20–29)
Calcium: 10.1 mg/dL (ref 8.7–10.3)
Chloride: 103 mmol/L (ref 96–106)
Creatinine, Ser: 0.71 mg/dL (ref 0.57–1.00)
GFR calc Af Amer: 104 mL/min/{1.73_m2} (ref >59)
GFR calc non Af Amer: 90 mL/min/{1.73_m2} (ref >59)
Glucose: 109 mg/dL — ABNORMAL HIGH (ref 65–99)
Potassium: 3.7 mmol/L (ref 3.5–5.2)
Sodium: 143 mmol/L (ref 134–144)

## 2018-12-30 LAB — VITAMIN B12: Vitamin B-12: 1340 pg/mL — ABNORMAL HIGH (ref 232–1245)

## 2018-12-30 LAB — HEMOGLOBIN A1C
Est. average glucose Bld gHb Est-mCnc: 117 mg/dL
Hgb A1c MFr Bld: 5.7 % — ABNORMAL HIGH (ref 4.8–5.6)

## 2019-01-12 ENCOUNTER — Encounter: Payer: Self-pay | Admitting: Internal Medicine

## 2019-01-23 ENCOUNTER — Telehealth (INDEPENDENT_AMBULATORY_CARE_PROVIDER_SITE_OTHER): Payer: BC Managed Care – PPO | Admitting: Family Medicine

## 2019-01-23 ENCOUNTER — Ambulatory Visit: Payer: Self-pay | Admitting: *Deleted

## 2019-01-23 ENCOUNTER — Other Ambulatory Visit: Payer: Self-pay

## 2019-01-23 DIAGNOSIS — Z20828 Contact with and (suspected) exposure to other viral communicable diseases: Secondary | ICD-10-CM

## 2019-01-23 DIAGNOSIS — Z20822 Contact with and (suspected) exposure to covid-19: Secondary | ICD-10-CM

## 2019-01-23 NOTE — Telephone Encounter (Signed)
Scheduled patient

## 2019-01-23 NOTE — Telephone Encounter (Signed)
Please schedule with any available provider for virtual visit. thanks

## 2019-01-23 NOTE — Progress Notes (Signed)
Virtual Visit Note  I connected with patient on 01/23/19 at 245pm by phone and verified that I am speaking with the correct person using two identifiers. Catherine Navarro is currently located at home and patient is currently with them during visit. The provider, Rutherford Guys, MD is located in their office at time of visit.  I discussed the limitations, risks, security and privacy concerns of performing an evaluation and management service by telephone and the availability of in person appointments. I also discussed with the patient that there may be a patient responsible charge related to this service. The patient expressed understanding and agreed to proceed.   CC: covid exposure  HPI ? Patient exposed to person on christmas day that couple days later was positive for covid She has been having a very mild runny nose and sore eyes which she has had on and off all winter she is feeling overall fine No loss of taste or smell, no headache, no body aches, no GI symptoms Temp today 98 She found out last night and has been self-isolating since then   Allergies  Allergen Reactions  . Other Other (See Comments)  . Amoxicillin     rash  . Codeine     REACTION: headache    Prior to Admission medications   Medication Sig Start Date End Date Taking? Authorizing Provider  aspirin 81 MG tablet Take 81 mg by mouth every other day.    Yes [provider]  Calcium Carbonate-Vitamin D (CALCIUM-D) 600-400 MG-UNIT TABS Take by mouth 2 (two) times daily.   Yes [provider]  Cholecalciferol (VITAMIN D) 2000 UNITS CAPS Take by mouth.     Yes [provider]  fish oil-omega-3 fatty acids 1000 MG capsule Take 1 g by mouth every other day.   Yes [provider]  fluticasone (FLONASE) 50 MCG/ACT nasal spray Place 2 sprays into both nostrils daily. 12/30/16  Yes Jeffery, Chelle, PA  Garlic [id number] MG TABS Take by mouth.     Yes [provider]  loratadine  (CLARITIN REDITABS) 10 MG dissolvable tablet Take 10 mg by mouth daily.     Yes [provider]  losartan-hydrochlorothiazide (HYZAAR) 100-25 MG tablet TAKE 1 TABLET BY MOUTH DAILY GENERIC EQUIVALENT FOR HYZAAR 12/29/18  Yes Rutherford Guys, MD  meloxicam (MOBIC) 15 MG tablet Take 1 tablet (15 mg total) by mouth daily as needed for pain. Take 1 tablet by mouth  daily 12/29/18  Yes Rutherford Guys, MD  Multiple Vitamin (MULTIVITAMIN PO) Take by mouth 1 day or 1 dose.     Yes [provider]  pantoprazole (PROTONIX) 40 MG tablet Take 1 tablet by mouth two  times daily 12/29/18  Yes Rutherford Guys, MD  potassium chloride SA (KLOR-CON) 20 MEQ tablet Take 1 tablet by mouth  daily 12/29/18  Yes Rutherford Guys, MD  propranolol ER (INDERAL LA) 80 MG 24 hr capsule TAKE 1 CAPSULE BY MOUTH EVERY DAY 12/29/18  Yes Rutherford Guys, MD  pyridOXINE (VITAMIN B-6) 100 MG tablet Take 200 mg by mouth daily.   Yes [provider]  vitamin B-12 (CYANOCOBALAMIN) 1000 MCG tablet Take 1,000 mcg by mouth daily.   Yes [provider]  zoster vaccine live, PF, (ZOSTAVAX) 09811 UNT/0.65ML injection Inject 19,400 Units into the skin once. 02/05/15  Yes Jeffery, Chelle, PA  propranolol (INNOPRAN XL) 80 MG 24 hr capsule Take 1 capsule (80 mg total) by mouth at bedtime. 04/21/12  12/29/12  Harrison Mons, PA    Past Medical History:  Diagnosis Date  . Abdominal pain 11/27/2010  . Accessory skin tags 01/25/2013  . Allergy   . Anemia    past hx  . Anisocoria    right eye  . Colon polyps 12/22/2010   Tubular Adenoma and Hyperplastic Polyp  . DDD (degenerative disc disease), cervical   . Elevated LFTs 11/27/2010  . Family history of colon cancer   . Fatty infiltration of liver   . Gastritis   . GERD (gastroesophageal reflux disease)   . Hiatal hernia   . HTN (hypertension)   . Hypothyroidism    with nodules  . IBS (irritable bowel syndrome)   . Muscle spasm of back 06/04/2015  .  Osteoarthritis   . Pancreatitis   . Sinusitis     Past Surgical History:  Procedure Laterality Date  . ANAL FISTULECTOMY    . CHOLECYSTECTOMY    . COLONOSCOPY    . COLONOSCOPY W/ BIOPSIES    . ESOPHAGOGASTRODUODENOSCOPY    . EYE SURGERY    . PATELLA RECONSTRUCTION     pins  . POLYPECTOMY    . TUBAL LIGATION    . UPPER GASTROINTESTINAL ENDOSCOPY      Social History   Tobacco Use  . Smoking status: Former Smoker    Types: Cigarettes    Quit date: 01/26/1998    Years since quitting: 21.0  . Smokeless tobacco: Never Used  Substance Use Topics  . Alcohol use: No    Family History  Problem Relation Age of Onset  . Breast cancer Maternal Aunt 65  . Diabetes Mother   . Irritable bowel syndrome Mother   . Stroke Mother   . Heart attack Paternal Grandfather   . COPD Father        emphysema  . Cancer Father   . Alzheimer's disease Paternal Grandmother   . Alzheimer's disease Maternal Aunt   . Colon cancer Sister   . Liver cancer Sister   . Cancer Brother 26       colon-colorectal colon cancer  . Colon cancer Brother   . Hyperlipidemia Sister   . Hypertension Sister   . Colon polyps Sister   . Diabetes Maternal Grandmother   . Hyperlipidemia Brother        triglycerides  . Hypertension Brother   . Colon polyps Brother   . Rectal cancer Brother   . Diabetes Sister   . Liver disease Sister        fatty liver, cirrhosis  . Colon polyps Sister   . Heart attack Son 70       rare genetic condition  . Heart disease Neg Hx   . Esophageal cancer Neg Hx   . Stomach cancer Neg Hx     ROS Per hpi  Objective  Vitals as reported by the patient: none   ASSESSMENT and PLAN  1. Close exposure to COVID-19 virus Patient evaluated, recommended testing 5-7 days after exposure. Discussed quarantine guidelines. Instructed to seek further care if symptoms develope - Novel Coronavirus, NAA ([facility])  FOLLOW-UP: prn   The above assessment and management plan was discussed  with the patient. The patient verbalized understanding of and has agreed to the management plan. Patient is aware to call the clinic if symptoms persist or worsen. Patient is aware when to return to the clinic for a follow-up visit. Patient educated on when it is appropriate to go to the emergency department.  I provided 11 minutes of non-face-to-face time during this encounter.  Rutherford Guys, MD [facility] [place], [place] [address] Ph.  [phone] Fax [phone]

## 2019-01-23 NOTE — Telephone Encounter (Signed)
Please advise 

## 2019-01-23 NOTE — Telephone Encounter (Signed)
Summary: covid testing questions    Pt called and stated that she was in contact with someone who tested positive on 01/22/19. She was in contact with him on 01/20/19. Please advise      Patient is calling to report exposure to COVID 12/25. She is not having symptoms at this time and wants to know if PCP recommends testing. Advised patient to quarantine for 14 days- monitor for symptoms and discussed community testing site and appointment scheduling process. Will send message to PCP for recommendation.  Reason for Disposition . [1] CLOSE CONTACT COVID-19 EXPOSURE within last 14 days AND [2] NO symptoms  Answer Assessment - Initial Assessment Questions 1. COVID-19 CLOSE CONTACT: "Who is the person with the confirmed or suspected COVID-19 infection that you were exposed to?"     Patient was around family who tested + COVID 2. PLACE of CONTACT: "Where were you when you were exposed to COVID-19?" (e.g., home, school, medical waiting room; which city?)     Exposed in home 3. TYPE of CONTACT: "How much contact was there?" (e.g., sitting next to, live in same house, work in same office, same building)     Patient was across the table 4. DURATION of CONTACT: "How long were you in contact with the COVID-19 patient?" (e.g., a few seconds, passed by person, a few minutes, 15 minutes or longer, live with the patient)     1 hour 5. MASK: "Were you wearing a mask?" "Was the other person wearing a mask?" Note: wearing a mask reduces the risk of an  otherwise close contact.     No mask 6. DATE of CONTACT: "When did you have contact with a COVID-19 patient?" (e.g., how many days ago)     12/25 7. COMMUNITY SPREAD: "Are there lots of cases of COVID-19 (community spread) where you live?" (See public health department website, if unsure)       Community spread 8. SYMPTOMS: "Do you have any symptoms?" (e.g., fever, cough, breathing difficulty, loss of taste or smell)     Sinus symptoms- not new 9. PREGNANCY OR  POSTPARTUM: "Is there any chance you are pregnant?" "When was your last menstrual period?" "Did you deliver in the last 2 weeks?"     n/a 10. HIGH RISK: "Do you have any heart or lung problems? Do you have a weak immune system?" (e.g., heart failure, COPD, asthma, HIV positive, chemotherapy, renal failure, diabetes mellitus, sickle cell anemia, obesity)       High blood pressure 11.  TRAVEL: "Have you traveled out of the country recently?" If so, "When and where?"  Also ask about out-of-state travel, since the CDC has identified some high-risk cities for community spread in the [place].  Note: Travel becomes less relevant if there is widespread community transmission where the patient lives.       No travel  Protocols used: CORONAVIRUS (COVID-19) EXPOSURE-A-AH

## 2019-01-23 NOTE — Progress Notes (Signed)
Pt was exposed to positive covid on Christmas day while having dinner at son's home. Not having any sx at this time. Pt wants to be tested for covid. She is quarantined for the moment. Pharmacy and meds verified

## 2019-01-25 ENCOUNTER — Ambulatory Visit: Payer: BC Managed Care – PPO | Attending: Internal Medicine

## 2019-01-25 ENCOUNTER — Other Ambulatory Visit: Payer: Self-pay

## 2019-01-25 DIAGNOSIS — Z20822 Contact with and (suspected) exposure to covid-19: Secondary | ICD-10-CM

## 2019-01-25 DIAGNOSIS — Z20828 Contact with and (suspected) exposure to other viral communicable diseases: Secondary | ICD-10-CM | POA: Diagnosis not present

## 2019-01-27 LAB — NOVEL CORONAVIRUS, NAA: SARS-CoV-2, NAA: NOT DETECTED

## 2019-01-31 ENCOUNTER — Encounter: Payer: Self-pay | Admitting: Internal Medicine

## 2019-02-07 ENCOUNTER — Other Ambulatory Visit (HOSPITAL_COMMUNITY)
Admission: RE | Admit: 2019-02-07 | Discharge: 2019-02-07 | Disposition: A | Discharge status: Home / Self Care | Payer: 59 | Source: Ambulatory Visit | Attending: Adult Health | Admitting: Adult Health

## 2019-02-07 ENCOUNTER — Other Ambulatory Visit: Payer: Self-pay

## 2019-02-07 ENCOUNTER — Encounter: Payer: Self-pay | Admitting: Adult Health

## 2019-02-07 ENCOUNTER — Ambulatory Visit (INDEPENDENT_AMBULATORY_CARE_PROVIDER_SITE_OTHER): Payer: 59 | Admitting: Adult Health

## 2019-02-07 VITALS — BP 140/69 | HR 62 | Ht 66.0 in | Wt 244.6 lb

## 2019-02-07 DIAGNOSIS — Z1211 Encounter for screening for malignant neoplasm of colon: Secondary | ICD-10-CM

## 2019-02-07 DIAGNOSIS — Z1212 Encounter for screening for malignant neoplasm of rectum: Secondary | ICD-10-CM

## 2019-02-07 DIAGNOSIS — Z01419 Encounter for gynecological examination (general) (routine) without abnormal findings: Secondary | ICD-10-CM | POA: Insufficient documentation

## 2019-02-07 DIAGNOSIS — E049 Nontoxic goiter, unspecified: Secondary | ICD-10-CM

## 2019-02-07 LAB — HEMOCCULT GUIAC POC 1CARD (OFFICE): Fecal Occult Blood, POC: NEGATIVE

## 2019-02-07 NOTE — Progress Notes (Signed)
Patient ID: Catherine Navarro, female   DOB: 12/26/1955, 64 y.o.   MRN: [id number] History of Present Illness: Catherine Navarro is a 64 year old white female, divorced, recently lost 63 year partner, PM in for a well woman gyn exam and pap. PCP is Dr Pamella Pert.    Current Medications, Allergies, Past Medical History, Past Surgical History, Family History and Social History were reviewed in [employer] record.     Review of Systems: Patient denies any headaches, hearing loss, fatigue, blurred vision, shortness of breath, chest pain, abdominal pain, problems with bowel movements, urination, or intercourse(no sex in 20 years). No joint pain or mood swings. She has noticed knot on right side of neck.     Physical Exam:BP 140/69 (BP Location: Left Arm, Patient Position: Sitting, Cuff Size: Large)   Pulse 62   Ht 5\' 6"  (1.676 m)   Wt 244 lb 9.6 oz (110.9 kg)   BMI 39.48 kg/m  General:  Well developed, well nourished, no acute distress Skin:  Warm and dry Neck:  Midline trachea, enlarged thyroid, has right nodule, good ROM, no lymphadenopathy, no carotid bruits heard  Lungs; Clear to auscultation bilaterally Breast:  No dominant palpable mass, retraction, or nipple discharge Cardiovascular: Regular rate and rhythm Abdomen:  Soft, non tender, no hepatosplenomegaly Pelvic:  External genitalia is normal in appearance, no lesions.  The vagina is normal in appearance for age with loss of color, moisture and rugae. Urethra has no lesions or masses. The cervix is smooth.  Uterus is felt to be normal size, shape, and contour.  No adnexal masses or tenderness noted.Bladder is non tender, no masses felt. Rectal: Good sphincter tone, no polyps, or hemorrhoids felt.  Hemoccult negative. Extremities/musculoskeletal:  No swelling or varicosities noted, no clubbing or cyanosis Psych:  No mood changes, alert and cooperative,seems happy Fall risk is low. PHQ score is 2.  Examination chaperoned by  Rolena Infante LPN.   Impression and Plan:  1. Encounter for gynecological examination with Papanicolaou smear of cervix Pap sent Pap in 3 years Physical and labs with PCP Mammogram yearly, has one scheduled for 03/07/19  2. Screening for colorectal cancer Has colonoscopy scheduled for 03/06/19  3. Enlarged thyroid Will get thyroid US 02/10/19 at 10:30 am at [facility] was 2018.

## 2019-02-09 LAB — CYTOLOGY - PAP
Comment: NEGATIVE
Diagnosis: NEGATIVE
High risk HPV: NEGATIVE

## 2019-02-10 ENCOUNTER — Ambulatory Visit (HOSPITAL_COMMUNITY)
Admission: RE | Admit: 2019-02-10 | Discharge: 2019-02-10 | Disposition: A | Discharge status: Home / Self Care | Payer: 59 | Source: Ambulatory Visit | Attending: Adult Health | Admitting: Adult Health

## 2019-02-10 DIAGNOSIS — E049 Nontoxic goiter, unspecified: Secondary | ICD-10-CM | POA: Diagnosis not present

## 2019-02-20 ENCOUNTER — Ambulatory Visit (AMBULATORY_SURGERY_CENTER): Payer: Self-pay | Admitting: *Deleted

## 2019-02-20 ENCOUNTER — Other Ambulatory Visit: Payer: Self-pay

## 2019-02-20 VITALS — Temp 96.9°F | Ht 66.0 in | Wt 244.0 lb

## 2019-02-20 DIAGNOSIS — Z86010 Personal history of colonic polyps: Secondary | ICD-10-CM

## 2019-02-20 DIAGNOSIS — Z800 Family history of malignant neoplasm of digestive organs: Secondary | ICD-10-CM

## 2019-02-20 DIAGNOSIS — Z01818 Encounter for other preprocedural examination: Secondary | ICD-10-CM

## 2019-02-20 MED ORDER — SUPREP BOWEL PREP KIT 17.5-3.13-1.6 GM/177ML PO SOLN: 1.0000 | Freq: Once | ORAL | 0 refills | Status: AC

## 2019-02-20 NOTE — Progress Notes (Signed)
No egg or soy allergy known to patient  No issues with past sedation with any surgeries  or procedures, no intubation problems  No diet pills per patient No home 02 use per patient  No blood thinners per patient  Pt denies issues with constipation - occ has an issue with constipation- -eats Activia with no issues -stools are soft regular daily to every other day  No A fib or A flutter  EMMI video sent to pt's e mail   Due to the COVID-19 pandemic we are asking patients to follow these guidelines. Please only bring one care partner. Please be aware that your care partner may wait in the car in the parking lot or if they feel like they will be too hot to wait in the car, they may wait in the lobby on the 4th floor. All care partners are required to wear a mask the entire time (we do not have any that we can provide them), they need to practice social distancing, and we will do a Covid check for all patient's and care partners when you arrive. Also we will check their temperature and your temperature. If the care partner waits in their car they need to stay in the parking lot the entire time and we will call them on their cell phone when the patient is ready for discharge so they can bring the car to the front of the building. Also all patient's will need to wear a mask into building.

## 2019-03-01 ENCOUNTER — Other Ambulatory Visit (HOSPITAL_COMMUNITY): Payer: 59

## 2019-03-02 ENCOUNTER — Other Ambulatory Visit: Payer: Self-pay

## 2019-03-02 ENCOUNTER — Other Ambulatory Visit: Payer: Self-pay | Admitting: Family Medicine

## 2019-03-02 ENCOUNTER — Other Ambulatory Visit (HOSPITAL_COMMUNITY)
Admission: RE | Admit: 2019-03-02 | Discharge: 2019-03-02 | Disposition: A | Discharge status: Home / Self Care | Payer: 59 | Source: Ambulatory Visit | Attending: Internal Medicine | Admitting: Internal Medicine

## 2019-03-02 DIAGNOSIS — Z01812 Encounter for preprocedural laboratory examination: Secondary | ICD-10-CM | POA: Insufficient documentation

## 2019-03-02 DIAGNOSIS — Z20822 Contact with and (suspected) exposure to covid-19: Secondary | ICD-10-CM | POA: Diagnosis not present

## 2019-03-02 DIAGNOSIS — Z1231 Encounter for screening mammogram for malignant neoplasm of breast: Secondary | ICD-10-CM

## 2019-03-02 LAB — SARS CORONAVIRUS 2 (TAT 6-24 HRS): SARS Coronavirus 2: NEGATIVE

## 2019-03-06 ENCOUNTER — Ambulatory Visit (AMBULATORY_SURGERY_CENTER): Payer: 59 | Admitting: Internal Medicine

## 2019-03-06 ENCOUNTER — Encounter: Payer: Self-pay | Admitting: Internal Medicine

## 2019-03-06 ENCOUNTER — Other Ambulatory Visit: Payer: Self-pay

## 2019-03-06 VITALS — BP 139/63 | HR 54 | Temp 96.7°F | Resp 22 | Ht 66.0 in | Wt 244.0 lb

## 2019-03-06 DIAGNOSIS — Z86010 Personal history of colonic polyps: Secondary | ICD-10-CM

## 2019-03-06 DIAGNOSIS — D122 Benign neoplasm of ascending colon: Secondary | ICD-10-CM | POA: Diagnosis not present

## 2019-03-06 DIAGNOSIS — D120 Benign neoplasm of cecum: Secondary | ICD-10-CM

## 2019-03-06 DIAGNOSIS — Z800 Family history of malignant neoplasm of digestive organs: Secondary | ICD-10-CM | POA: Diagnosis not present

## 2019-03-06 DIAGNOSIS — D125 Benign neoplasm of sigmoid colon: Secondary | ICD-10-CM

## 2019-03-06 DIAGNOSIS — D123 Benign neoplasm of transverse colon: Secondary | ICD-10-CM | POA: Diagnosis not present

## 2019-03-06 MED ORDER — SODIUM CHLORIDE 0.9 % IV SOLN: 500.0000 mL | Freq: Once | INTRAVENOUS | Status: DC

## 2019-03-06 NOTE — Progress Notes (Signed)
PT taken to PACU. Monitors in place. VSS. Report given to RN. 

## 2019-03-06 NOTE — Progress Notes (Signed)
Vitals-DT Temp-JB  Pt's states no medical or surgical changes since previsit or office visit. 

## 2019-03-06 NOTE — Op Note (Addendum)
[facility] Patient Name: Catherine Navarro Procedure Date: 03/06/2019 9:38 AM MRN: [id number] Endoscopist: Jerene Bears , MD Age: 64 Referring MD:  Date of Birth: 1955-12-25 Gender: Female Account #: [id number] Procedure:                Colonoscopy Indications:              High risk colon cancer surveillance: Personal                            history of multiple (3 or more) adenomas, Family                            history of colon cancer in multiple first-degree                            relatives, Last colonoscopy: November 2017 Medicines:                Monitored Anesthesia Care Procedure:                Pre-Anesthesia Assessment:                           - Prior to the procedure, a History and Physical                            was performed, and patient medications and                            allergies were reviewed. The patient's tolerance of                            previous anesthesia was also reviewed. The risks                            and benefits of the procedure and the sedation                            options and risks were discussed with the patient.                            All questions were answered, and informed consent                            was obtained. Prior Anticoagulants: The patient has                            taken no previous anticoagulant or antiplatelet                            agents. ASA Grade Assessment: III - A patient with                            severe systemic disease. After reviewing the risks  and benefits, the patient was deemed in                            satisfactory condition to undergo the procedure.                           After obtaining informed consent, the colonoscope                            was passed under direct vision. Throughout the                            procedure, the patient's blood pressure, pulse, and                            oxygen saturations were  monitored continuously. The                            Colonoscope was introduced through the anus and                            advanced to the cecum, identified by appendiceal                            orifice and ileocecal valve. The colonoscopy was                            performed without difficulty. The patient tolerated                            the procedure well. The quality of the bowel                            preparation was good. The ileocecal valve,                            appendiceal orifice, and rectum were photographed. Scope In: 9:52:10 AM Scope Out: 10:18:32 AM Scope Withdrawal Time: 0 hours 15 minutes 31 seconds  Total Procedure Duration: 0 hours 26 minutes 22 seconds  Findings:                 The digital rectal exam was normal.                           A 4 mm polyp was found in the cecum. The polyp was                            sessile. The polyp was removed with a cold snare.                            Resection and retrieval were complete.                           Two sessile polyps were found in the  ascending                            colon. The polyps were 3 to 4 mm in size. These                            polyps were removed with a cold snare. Resection                            and retrieval were complete.                           There was a medium-sized lipoma, in the ascending                            colon.                           A 4 mm polyp was found in the hepatic flexure. The                            polyp was sessile. The polyp was removed with a                            cold snare. Resection and retrieval were complete.                           Three sessile polyps were found in the transverse                            colon. The polyps were 3 to 5 mm in size. These                            polyps were removed with a cold snare. Resection                            and retrieval were complete.                            A 5 mm polyp was found in the sigmoid colon. The                            polyp was sessile. The polyp was removed with a                            cold snare. Resection and retrieval were complete.                           Multiple small and large-mouthed diverticula were                            found in the sigmoid colon and descending colon.  Internal hemorrhoids were found during                            retroflexion. The hemorrhoids were small. Complications:            No immediate complications. Estimated Blood Loss:     Estimated blood loss was minimal. Impression:               - One 4 mm polyp in the cecum, removed with a cold                            snare. Resected and retrieved.                           - Two 3 to 4 mm polyps in the ascending colon,                            removed with a cold snare. Resected and retrieved.                           - Medium-sized lipoma in the ascending colon.                           - One 4 mm polyp at the hepatic flexure, removed                            with a cold snare. Resected and retrieved.                           - Three 3 to 5 mm polyps in the transverse colon,                            removed with a cold snare. Resected and retrieved.                           - One 5 mm polyp in the sigmoid colon, removed with                            a cold snare. Resected and retrieved.                           - Diverticulosis in the sigmoid colon and in the                            descending colon.                           - Internal hemorrhoids. Recommendation:           - Patient has a contact number available for                            emergencies. The signs and symptoms of potential  delayed complications were discussed with the                            patient. Return to normal activities tomorrow.                            Written discharge  instructions were provided to the                            patient.                           - Resume previous diet.                           - Continue present medications.                           - Await pathology results.                           - Repeat colonoscopy is recommended for                            surveillance. The colonoscopy date will be                            determined after pathology results from today's                            exam become available for review. Jerene Bears, MD 03/06/2019 10:22:59 AM This report has been signed electronically.

## 2019-03-06 NOTE — Patient Instructions (Signed)
Please read handouts provided. Continue present medications. Await pathology results.        YOU HAD AN ENDOSCOPIC PROCEDURE TODAY AT THE [facility]:   Refer to the procedure report that was given to you for any specific questions about what was found during the examination.  If the procedure report does not answer your questions, please call your gastroenterologist to clarify.  If you requested that your care partner not be given the details of your procedure findings, then the procedure report has been included in a sealed envelope for you to review at your convenience later.  YOU SHOULD EXPECT: Some feelings of bloating in the abdomen. Passage of more gas than usual.  Walking can help get rid of the air that was put into your GI tract during the procedure and reduce the bloating. If you had a lower endoscopy (such as a colonoscopy or flexible sigmoidoscopy) you may notice spotting of blood in your stool or on the toilet paper. If you underwent a bowel prep for your procedure, you may not have a normal bowel movement for a few days.  Please Note:  You might notice some irritation and congestion in your nose or some drainage.  This is from the oxygen used during your procedure.  There is no need for concern and it should clear up in a day or so.  SYMPTOMS TO REPORT IMMEDIATELY:   Following lower endoscopy (colonoscopy or flexible sigmoidoscopy):  Excessive amounts of blood in the stool  Significant tenderness or worsening of abdominal pains  Swelling of the abdomen that is new, acute  Fever of 100F or higher    For urgent or emergent issues, a gastroenterologist can be reached at any hour by calling [phone].   DIET:  We do recommend a small meal at first, but then you may proceed to your regular diet.  Drink plenty of fluids but you should avoid alcoholic beverages for 24 hours.  ACTIVITY:  You should plan to take it easy for the rest of today and you should NOT  DRIVE or use heavy machinery until tomorrow (because of the sedation medicines used during the test).    FOLLOW UP: Our staff will call the number listed on your records 48-72 hours following your procedure to check on you and address any questions or concerns that you may have regarding the information given to you following your procedure. If we do not reach you, we will leave a message.  We will attempt to reach you two times.  During this call, we will ask if you have developed any symptoms of COVID 19. If you develop any symptoms (ie: fever, flu-like symptoms, shortness of breath, cough etc.) before then, please call [phone].  If you test positive for Covid 19 in the 2 weeks post procedure, please call and report this information to us.    If any biopsies were taken you will be contacted by phone or by letter within the next 1-3 weeks.  Please call us at [phone] if you have not heard about the biopsies in 3 weeks.    SIGNATURES/CONFIDENTIALITY: You and/or your care partner have signed paperwork which will be entered into your electronic medical record.  These signatures attest to the fact that that the information above on your After Visit Summary has been reviewed and is understood.  Full responsibility of the confidentiality of this discharge information lies with you and/or your care-partner. 

## 2019-03-06 NOTE — Progress Notes (Signed)
Called to room to assist during endoscopic procedure.  Patient ID and intended procedure confirmed with present staff. Received instructions for my participation in the procedure from the performing physician.  

## 2019-03-07 ENCOUNTER — Ambulatory Visit
Admission: RE | Admit: 2019-03-07 | Discharge: 2019-03-07 | Disposition: A | Discharge status: Home / Self Care | Payer: 59 | Source: Ambulatory Visit

## 2019-03-07 DIAGNOSIS — Z1231 Encounter for screening mammogram for malignant neoplasm of breast: Secondary | ICD-10-CM

## 2019-03-08 ENCOUNTER — Telehealth: Payer: Self-pay

## 2019-03-08 NOTE — Telephone Encounter (Signed)
  Follow up Call-  Call back number 03/06/2019  Post procedure Call Back phone  # [phone]  Permission to leave phone message Yes  Some recent data might be hidden     Patient questions:  Do you have a fever, pain , or abdominal swelling? No. Pain Score  0 *  Have you tolerated food without any problems? Yes.    Have you been able to return to your normal activities? Yes.    Do you have any questions about your discharge instructions: Diet   No. Medications  No. Follow up visit  No.  Do you have questions or concerns about your Care? No.  Actions: * If pain score is 4 or above: No action needed, pain <4.  1. Have you developed a fever since your procedure? no  2.   Have you had an respiratory symptoms (SOB or cough) since your procedure? no  3.   Have you tested positive for COVID 19 since your procedure no  4.   Have you had any family members/close contacts diagnosed with the COVID 19 since your procedure?  no   If yes to any of these questions please route to Joylene John, RN and Alphonsa Gin, Therapist, sports.

## 2019-03-09 ENCOUNTER — Encounter: Payer: Self-pay | Admitting: Internal Medicine

## 2019-06-29 ENCOUNTER — Encounter: Payer: Self-pay | Admitting: Family Medicine

## 2019-06-29 ENCOUNTER — Ambulatory Visit (INDEPENDENT_AMBULATORY_CARE_PROVIDER_SITE_OTHER): Payer: 59 | Admitting: Family Medicine

## 2019-06-29 ENCOUNTER — Other Ambulatory Visit: Payer: Self-pay

## 2019-06-29 VITALS — BP 138/78 | HR 66 | Temp 97.7°F | Resp 14 | Ht 66.0 in | Wt 240.2 lb

## 2019-06-29 DIAGNOSIS — R739 Hyperglycemia, unspecified: Secondary | ICD-10-CM | POA: Diagnosis not present

## 2019-06-29 DIAGNOSIS — I10 Essential (primary) hypertension: Secondary | ICD-10-CM | POA: Diagnosis not present

## 2019-06-29 DIAGNOSIS — M7711 Lateral epicondylitis, right elbow: Secondary | ICD-10-CM | POA: Diagnosis not present

## 2019-06-29 LAB — HEMOGLOBIN A1C
Est. average glucose Bld gHb Est-mCnc: 114 mg/dL
Hgb A1c MFr Bld: 5.6 % (ref 4.8–5.6)

## 2019-06-29 LAB — COMPREHENSIVE METABOLIC PANEL
ALT: 38 IU/L — ABNORMAL HIGH (ref 0–32)
AST: 30 IU/L (ref 0–40)
Albumin/Globulin Ratio: 1.9 (ref 1.2–2.2)
Albumin: 4.5 g/dL (ref 3.8–4.8)
Alkaline Phosphatase: 103 IU/L (ref 48–121)
BUN/Creatinine Ratio: 22 (ref 12–28)
BUN: 19 mg/dL (ref 8–27)
Bilirubin Total: 0.5 mg/dL (ref 0.0–1.2)
CO2: 22 mmol/L (ref 20–29)
Calcium: 10 mg/dL (ref 8.7–10.3)
Chloride: 104 mmol/L (ref 96–106)
Creatinine, Ser: 0.87 mg/dL (ref 0.57–1.00)
GFR calc Af Amer: 81 mL/min/{1.73_m2} (ref >59)
GFR calc non Af Amer: 71 mL/min/{1.73_m2} (ref >59)
Globulin, Total: 2.4 g/dL (ref 1.5–4.5)
Glucose: 111 mg/dL — ABNORMAL HIGH (ref 65–99)
Potassium: 4.1 mmol/L (ref 3.5–5.2)
Sodium: 143 mmol/L (ref 134–144)
Total Protein: 6.9 g/dL (ref 6.0–8.5)

## 2019-06-29 LAB — LIPID PANEL
Chol/HDL Ratio: 2.7 ratio (ref 0.0–4.4)
Cholesterol, Total: 156 mg/dL (ref 100–199)
HDL: 57 mg/dL (ref >39)
LDL Chol Calc (NIH): 84 mg/dL (ref 0–99)
Triglycerides: 81 mg/dL (ref 0–149)
VLDL Cholesterol Cal: 15 mg/dL (ref 5–40)

## 2019-06-29 NOTE — Progress Notes (Signed)
6/3/20219:17 AM  Catherine Navarro [birth year], 64 y.o., female [id number]  Chief Complaint  Patient presents with  . Hypertension    pt is following up for hypertension, took BP this am 117/75 before coming here, Catherine Navarro takes it a few times a week and notes it is typically normal, pt denies physical symptoms   . Arm Pain    pt is concerned Catherine Navarro has arthritis in her Rt arm, for several years but Catherine Navarro notes it has been getting worse, wonders if Catherine Navarro should increase meloxicam     HPI:   Patient is a 64 y.o. female with past medical history significant for HTN, GERD,pre-diabetes,OA right knee, b12 deficiencywho presents today for routine followup  Last OV dec 2020 - no changes Had her WWE exam with obgyn jan 2021, pap and HPV neg, mammo feb 2021 normal Had colonoscopy feb 2021, polyps, repeat in 3 years  Catherine Navarro is overall doing well Catherine Navarro has been working on weight loss thru calorie counting (1200 calories) and walking Having right elbow pain for several weeks Denies any trauma, redness, swelling or warmth Denies any focal weakness, numbness or tingling Takes meloxicam twice a week which helps keep pain level down   Depression screen [facility] 2/9 06/29/2019 02/07/2019 12/29/2018  Decreased Interest 0 0 0  Down, Depressed, Hopeless 0 0 0  PHQ - 2 Score 0 0 0    Fall Risk  06/29/2019 02/07/2019 12/29/2018 06/30/2018 12/14/2017  Falls in the past year? 0 0 0 0 0  Number falls in past yr: - 0 0 0 -  Injury with Fall? - 0 0 0 -  Follow up Falls evaluation completed - - - -     Allergies  Allergen Reactions  . Other Other (See Comments)  . Amoxicillin     rash  . Codeine     REACTION: headache    Prior to Admission medications   Medication Sig Start Date End Date Taking? Authorizing Provider  Ascorbic Acid (VITAMIN C) 100 MG tablet Take 100 mg by mouth daily.   Yes [provider]  aspirin 81 MG tablet Take 81 mg by mouth every other day.    Yes [provider]  Calcium  Carbonate-Vitamin D (CALCIUM-D) 600-400 MG-UNIT TABS Take by mouth 2 (two) times daily.   Yes [provider]  fish oil-omega-3 fatty acids 1000 MG capsule Take 1 g by mouth every other day.   Yes [provider]  fluticasone (FLONASE) 50 MCG/ACT nasal spray Place 2 sprays into both nostrils daily. Patient taking differently: Place 2 sprays into both nostrils as needed.  12/30/16  Yes Jeffery, Chelle, PA  Garlic [id number] MG TABS Take by mouth. 1000 mg daily   Yes [provider]  loratadine (CLARITIN REDITABS) 10 MG dissolvable tablet Take 10 mg by mouth daily.     Yes [provider]  losartan-hydrochlorothiazide (HYZAAR) 100-25 MG tablet TAKE 1 TABLET BY MOUTH DAILY GENERIC EQUIVALENT FOR HYZAAR 12/29/18  Yes Rutherford Guys, MD  meloxicam (MOBIC) 15 MG tablet Take 1 tablet (15 mg total) by mouth daily as needed for pain. Take 1 tablet by mouth  daily Patient taking differently: Take 15 mg by mouth daily as needed for pain. Take 1 tablet by mouth  Daily- takes WED and SAT 12/29/18  Yes Rutherford Guys, MD  Multiple Vitamin (MULTIVITAMIN PO) Take by mouth 1 day or 1 dose.     Yes [provider]  pantoprazole (PROTONIX) 40 MG tablet  Take 1 tablet by mouth two  times daily 12/29/18  Yes Rutherford Guys, MD  potassium chloride SA (KLOR-CON) 20 MEQ tablet Take 1 tablet by mouth  daily Patient taking differently: Take 1 tablet by mouth  Every other day 12/29/18  Yes Rutherford Guys, MD  propranolol ER (INDERAL LA) 80 MG 24 hr capsule TAKE 1 CAPSULE BY MOUTH EVERY DAY 12/29/18  Yes Rutherford Guys, MD  pyridOXINE (VITAMIN B-6) 100 MG tablet Take 100 mg by mouth daily.    Yes [provider]  vitamin B-12 (CYANOCOBALAMIN) 1000 MCG tablet Take 1,000 mcg by mouth daily.   Yes [provider]  Zinc 100 MG TABS Take by mouth.    [provider]  propranolol (INNOPRAN XL) 80 MG 24 hr capsule Take 1 capsule (80 mg total) by mouth at bedtime.  04/21/12 12/29/12  Harrison Mons, PA    Past Medical History:  Diagnosis Date  . Abdominal pain 11/27/2010  . Accessory skin tags 01/25/2013  . Allergy   . Anemia    past hx  . Anisocoria    right eye  . Cancer ([place])    skin cancer removed from nose   . Colon polyps 12/22/2010   Tubular Adenoma and Hyperplastic Polyp  . DDD (degenerative disc disease), cervical   . Elevated LFTs 11/27/2010  . Family history of colon cancer   . Fatty infiltration of liver   . Gastritis   . GERD (gastroesophageal reflux disease)   . Hiatal hernia   . HTN (hypertension)   . Hypothyroidism    with nodules  . IBS (irritable bowel syndrome)   . Muscle spasm of back 06/04/2015  . Osteoarthritis   . Pancreatitis   . Sinusitis     Past Surgical History:  Procedure Laterality Date  . ANAL FISTULECTOMY    . CHOLECYSTECTOMY    . COLONOSCOPY    . COLONOSCOPY W/ BIOPSIES    . ESOPHAGOGASTRODUODENOSCOPY    . EYE SURGERY    . PATELLA RECONSTRUCTION     pins  . POLYPECTOMY    . 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 TUBAL LIGATION    . UPPER GASTROINTESTINAL ENDOSCOPY      Social History   Tobacco Use  . Smoking status: Former Smoker    Types: Cigarettes    Quit date: 01/26/1998    Years since quitting: 21.4  . Smokeless tobacco: Never Used  Substance Use Topics  . Alcohol use: No    Family History  Problem Relation Age of Onset  . Breast cancer Maternal Aunt 65  . Diabetes Mother   . Irritable bowel syndrome Mother   . Stroke Mother   . Heart attack Paternal Grandfather   . COPD Father        emphysema  . Cancer Father   . Alzheimer's disease Paternal Grandmother   . Alzheimer's disease Maternal Aunt   . Colon cancer Sister   . Liver cancer Sister   . Cancer Brother 58       colon-colorectal colon cancer  . Colon cancer Brother   . Rectal cancer Brother   . Hyperlipidemia Sister   . Hypertension Sister   . Colon polyps Sister   . Kidney cancer Sister   . Diabetes Maternal  Grandmother   . Hyperlipidemia Brother        triglycerides  . Hypertension Brother   . Colon polyps Brother   . Diabetes Sister   . Liver disease Sister  fatty liver, cirrhosis  . Colon polyps Sister   . Kartagener's syndrome Sister   . Heart attack Son 54       rare genetic condition  . Heart disease Neg Hx   . Esophageal cancer Neg Hx   . Stomach cancer Neg Hx     Review of Systems  Constitutional: Negative for chills and fever.  Respiratory: Negative for cough and shortness of breath.   Cardiovascular: Negative for chest pain, palpitations and leg swelling.  Gastrointestinal: Negative for abdominal pain, nausea and vomiting.   Per hpi  OBJECTIVE:  Today's Vitals   06/29/19 0911 06/29/19 0917  BP: (!) 147/83 138/78  Pulse: 66   Resp: 14   Temp: 97.7 F (36.5 C)   TempSrc: Temporal   SpO2: 96%   Weight: 240 lb 3.2 oz (109 kg)   Height: 5\' 6"  (1.676 m)    Body mass index is 38.77 kg/m.  Wt Readings from Last 3 Encounters:  06/29/19 240 lb 3.2 oz (109 kg)  03/06/19 244 lb (110.7 kg)  02/20/19 244 lb (110.7 kg)    Physical Exam Vitals and nursing note reviewed.  Constitutional:      Appearance: Catherine Navarro is well-developed.  HENT:     Head: Normocephalic and atraumatic.     Mouth/Throat:     Pharynx: No oropharyngeal exudate.  Eyes:     General: No scleral icterus.    Conjunctiva/sclera: Conjunctivae normal.     Pupils: Pupils are equal, round, and reactive to light.  Cardiovascular:     Rate and Rhythm: Normal rate and regular rhythm.     Heart sounds: Normal heart sounds. No murmur. No friction rub. No gallop.   Pulmonary:     Effort: Pulmonary effort is normal.     Breath sounds: Normal breath sounds. No wheezing or rales.  Musculoskeletal:     Right shoulder: Normal.     Right elbow: No swelling. Normal range of motion. Tenderness present in lateral epicondyle.     Cervical back: Neck supple.     Comments: Neurovascularly intact in RUE  Skin:     General: Skin is warm and dry.  Neurological:     Mental Status: Catherine Navarro is alert and oriented to person, place, and time.     No results found for this or any previous visit (from the past 24 hour(s)).  No results found.   ASSESSMENT and PLAN  1. Essential hypertension Controlled. Continue current regime.  - Lipid panel - Comprehensive metabolic panel  2. Hyperglycemia Lab pending, continue working on LFM - Hemoglobin A1c  3. Lateral epicondylitis of right elbow Discussed supportive measures, placed in arm band brace, meloxicam daily x week and then prn and RTC precautions. Patient educational handout given.  Return in about 6 months (around 12/29/2019).    Rutherford Guys, MD [facility] [place], [place] [address] Ph.  [phone] Fax [phone]

## 2019-06-29 NOTE — Patient Instructions (Addendum)
If you have lab work done today you will be contacted with your lab results within the next 2 weeks.  If you have not heard from [place] then please contact us. The fastest way to get your results is to register for My Chart.   IF you received an x-ray today, you will receive an invoice from [facility]. Please contact [facility] at [phone] with questions or concerns regarding your invoice.   IF you received labwork today, you will receive an invoice from [facility]. Please contact [facility] at [phone] with questions or concerns regarding your invoice.   Our billing staff will not be able to assist you with questions regarding bills from these companies.  You will be contacted with the lab results as soon as they are available. The fastest way to get your results is to activate your My Chart account. Instructions are located on the last page of this paperwork. If you have not heard from [place] regarding the results in 2 weeks, please contact this office.     Tennis Elbow Rehab Ask your health care provider which exercises are safe for you. Do exercises exactly as told by your health care provider and adjust them as directed. It is normal to feel mild stretching, pulling, tightness, or discomfort as you do these exercises. Stop right away if you feel sudden pain or your pain gets worse. Do not begin these exercises until told by your health care provider. Stretching and range-of-motion exercises These exercises warm up your muscles and joints and improve the movement and flexibility of your elbow. These exercises also help to relieve pain, numbness, and tingling. Wrist flexion, assisted  1. Straighten your left / right elbow in front of you with your palm facing down toward the floor. ? If told by your health care provider, bend your left / right elbow to a 90-degree angle (right angle) at your side. 2. With your other hand, gently push over the back of your left / right  hand so your fingers point toward the floor (flexion). Stop when you feel a gentle stretch on the back of your forearm. 3. Hold this position for __________ seconds. Repeat __________ times. Complete this exercise __________ times a day. Wrist extension, assisted  1. Straighten your left / right elbow in front of you with your palm facing up toward the ceiling. ? If told by your health care provider, bend your left / right elbow to a 90-degree angle (right angle) at your side. 2. With your other hand, gently pull your left / right hand and fingers toward the floor (extension). Stop when you feel a gentle stretch on the palm side of your forearm. 3. Hold this position for __________ seconds. Repeat __________ times. Complete this exercise __________ times a day. Assisted forearm rotation, supination 1. Sit or stand with your left / right elbow bent to a 90-degree angle (right angle) at your side. 2. Using your uninjured hand, turn (rotate) your left / right palm up toward the ceiling (supination) until you feel a gentle stretch along the inside of your forearm. 3. Hold this position for __________ seconds. Repeat __________ times. Complete this exercise __________ times a day. Assisted forearm rotation, pronation 1. Sit or stand with your left / right elbow bent to a 90-degree angle (right angle) at your side. 2. Using your uninjured hand, rotate your left / right palm down toward the floor (pronation) until you feel a gentle stretch along the outside of your  forearm. 3. Hold this position for __________ seconds. Repeat __________ times. Complete this exercise __________ times a day. Strengthening exercises These exercises build strength and endurance in your forearm and elbow. Endurance is the ability to use your muscles for a long time, even after they get tired. Radial deviation  1. Stand with a __________ weight or a hammer in your left / right hand. Or, sit while holding a rubber exercise  band or tubing, with your left / right forearm supported on a table or countertop. ? If you are standing, position your forearm so that your thumb is facing forward. If you are sitting, position your forearm so that the thumb is facing the ceiling. This is the neutral position. 2. Raise your hand upward in front of you so your thumb moves toward the ceiling (radial deviation), or pull up on the rubber tubing. Keep your forearm and elbow still while you move your wrist only. 3. Hold this position for __________ seconds. 4. Slowly return to the starting position. Repeat __________ times. Complete this exercise __________ times a day. Wrist extension, eccentric 1. Sit with your left / right forearm palm-down and supported on a table or other surface. Let your left / right wrist extend over the edge of the surface. 2. Hold a __________ weight or a piece of exercise band or tubing in your left / right hand. ? If using a rubber exercise band or tubing, hold the other end of the tubing with your other hand. 3. Use your uninjured hand to move your left / right hand up toward the ceiling. 4. Take your uninjured hand away and slowly return to the starting position using only your left / right hand. Lowering your arm under tension is called eccentric extension. Repeat __________ times. Complete this exercise __________ times a day. Wrist extension Do not do this exercise if it causes pain at the outside of your elbow. Only do this exercise once instructed by your health care provider. 1. Sit with your left / right forearm supported on a table or other surface and your palm turned down toward the floor. Let your left / right wrist extend over the edge of the surface. 2. Hold a __________ weight or a piece of rubber exercise band or tubing. ? If you are using a rubber exercise band or tubing, hold the band or tubing in place with your other hand to provide resistance. 3. Slowly bend your wrist so your hand  moves up toward the ceiling (extension). Move only your wrist, keeping your forearm and elbow still. 4. Hold this position for __________ seconds. 5. Slowly return to the starting position. Repeat __________ times. Complete this exercise __________ times a day. Forearm rotation, supination To do this exercise, you will need a lightweight hammer or rubber mallet. 1. Sit with your left / right forearm supported on a table or other surface. Bend your elbow to a 90-degree angle (right angle). Position your forearm so that your palm is facing down toward the floor, with your hand resting over the edge of the table. 2. Hold a hammer in your left / right hand. ? To make this exercise easier, hold the hammer near the head of the hammer. ? To make this exercise harder, hold the hammer near the end of the handle. 3. Without moving your wrist or elbow, slowly rotate your forearm so your palm faces up toward the ceiling (supination). 4. Hold this position for __________ seconds. 5. Slowly return to the starting  position. Repeat __________ times. Complete this exercise __________ times a day. Shoulder blade squeeze 1. Sit in a stable chair or stand with good posture. If you are sitting down, do not let your back touch the back of the chair. 2. Your arms should be at your sides with your elbows bent to a 90-degree angle (right angle). Position your forearms so that your thumbs are facing the ceiling (neutral position). 3. Without lifting your shoulders up, squeeze your shoulder blades tightly together. 4. Hold this position for __________ seconds. 5. Slowly release and return to the starting position. Repeat __________ times. Complete this exercise __________ times a day. This information is not intended to replace advice given to you by your health care provider. Make sure you discuss any questions you have with your health care provider. Document Revised: 05/05/2018 Document Reviewed: 03/08/2018 Elsevier  Patient Education  [place].

## 2019-07-30 ENCOUNTER — Encounter: Payer: Self-pay | Admitting: Family Medicine

## 2019-07-30 DIAGNOSIS — I10 Essential (primary) hypertension: Secondary | ICD-10-CM

## 2019-07-30 DIAGNOSIS — K219 Gastro-esophageal reflux disease without esophagitis: Secondary | ICD-10-CM

## 2019-07-30 DIAGNOSIS — G8929 Other chronic pain: Secondary | ICD-10-CM

## 2019-08-01 MED ORDER — POTASSIUM CHLORIDE CRYS ER 20 MEQ PO TBCR: EXTENDED_RELEASE_TABLET | ORAL | 3 refills | Status: DC

## 2019-08-01 MED ORDER — PANTOPRAZOLE SODIUM 40 MG PO TBEC: DELAYED_RELEASE_TABLET | ORAL | 3 refills | Status: AC

## 2019-08-01 MED ORDER — MELOXICAM 15 MG PO TABS: 15.0000 mg | ORAL_TABLET | Freq: Every day | ORAL | 1 refills | Status: AC | PRN

## 2019-08-01 MED ORDER — PROPRANOLOL HCL ER 80 MG PO CP24: ORAL_CAPSULE | ORAL | 1 refills | Status: DC

## 2019-08-01 MED ORDER — LOSARTAN POTASSIUM-HCTZ 100-25 MG PO TABS: ORAL_TABLET | ORAL | 1 refills | Status: DC

## 2019-08-04 ENCOUNTER — Telehealth: Payer: Self-pay

## 2019-08-04 MED ORDER — POTASSIUM CHLORIDE CRYS ER 20 MEQ PO TBCR
20.0000 meq | EXTENDED_RELEASE_TABLET | Freq: Every day | ORAL | 1 refills | Status: AC
Start: 2019-08-04 — End: ?

## 2019-08-04 NOTE — Telephone Encounter (Signed)
Received fax from [facility] requesting medication clarification for potassium chloride 20 meq. Is not available in 20 meq.  20 meq is available in 2 different formulations; Generic for K-tab and generic for Klor-Con M.

## 2019-08-04 NOTE — Telephone Encounter (Signed)
This has been addressed.

## 2019-08-04 NOTE — Telephone Encounter (Signed)
Faxed clarification for klor con 2meq. Rx is for k-tab (cr wax matrix tablets)

## 2019-12-26 ENCOUNTER — Encounter (INDEPENDENT_AMBULATORY_CARE_PROVIDER_SITE_OTHER): Payer: Self-pay | Admitting: Nurse Practitioner

## 2019-12-26 ENCOUNTER — Other Ambulatory Visit: Payer: Self-pay

## 2019-12-26 ENCOUNTER — Ambulatory Visit (INDEPENDENT_AMBULATORY_CARE_PROVIDER_SITE_OTHER): Payer: Medicare Other | Admitting: Nurse Practitioner

## 2019-12-26 VITALS — BP 144/80 | HR 48 | Temp 97.3°F | Ht 64.25 in | Wt 240.4 lb

## 2019-12-26 DIAGNOSIS — E042 Nontoxic multinodular goiter: Secondary | ICD-10-CM

## 2019-12-26 DIAGNOSIS — I10 Essential (primary) hypertension: Secondary | ICD-10-CM | POA: Diagnosis not present

## 2019-12-26 DIAGNOSIS — Z23 Encounter for immunization: Secondary | ICD-10-CM

## 2019-12-26 DIAGNOSIS — Z6841 Body Mass Index (BMI) 40.0 and over, adult: Secondary | ICD-10-CM

## 2019-12-26 NOTE — Progress Notes (Signed)
Subjective:  Patient ID: Catherine Navarro, female    DOB: [birth year]  Age: 64 y.o. MRN: [id number]  CC:  Chief Complaint  Patient presents with  . Establish Care    has thyroid nodules and concerned and can feel them      HPI  This patient arrives today for the above.  She is here to establish care and she is establishing care today because she lives in [place] and would like a provider a bit closer to her. Previous providers are leaving her previous practice.  Overall she feels well but she is concerned about thyroid nodules that she has been able to palpate. She does have a history of thyroid goiter and nodules. In 2011 this was investigated and she underwent biopsy. She did have repeat ultrasound completed in January of this year and ultrasound showed stable nodules. Upon further questioning the patient tells me that she has not noticed the nodules growing or becoming more prominent since January but did want to mention this today at the office visit.  She does bring in some paperwork from an at home health maintenance visit that she had completed and did date that her POC hemoglobin A1c was 6.0. Per chart review she did have a serum A1c collected about 5 months ago and it was 5.6. She would like to have this A1c of 6.0 verified if possible.  Past Medical History:  Diagnosis Date  . Abdominal pain 11/27/2010  . Accessory skin tags 01/25/2013  . Allergy   . Anemia    past hx  . Anisocoria    right eye  . Cancer ([place])    skin cancer removed from nose   . Colon polyps 12/22/2010   Tubular Adenoma and Hyperplastic Polyp  . DDD (degenerative disc disease), cervical   . Elevated LFTs 11/27/2010  . Family history of colon cancer   . Fatty infiltration of liver   . Gastritis   . GERD (gastroesophageal reflux disease)   . Hiatal hernia   . HTN (hypertension)   . Hypothyroidism    with nodules  . IBS (irritable bowel syndrome)   . Muscle spasm of back 06/04/2015  .  Osteoarthritis   . Pancreatitis   . Sinusitis       Family History  Problem Relation Age of Onset  . Breast cancer Maternal Aunt 65  . Diabetes Mother   . Irritable bowel syndrome Mother   . Stroke Mother   . Heart attack Paternal Grandfather   . COPD Father        emphysema  . Cancer Father   . Alzheimer's disease Paternal Grandmother   . Alzheimer's disease Maternal Aunt   . Colon cancer Sister   . Liver cancer Sister   . Cancer Brother 22       colon-colorectal colon cancer  . Colon cancer Brother   . Rectal cancer Brother   . Hyperlipidemia Sister   . Hypertension Sister   . Colon polyps Sister   . Kidney cancer Sister   . Diabetes Maternal Grandmother   . Hyperlipidemia Brother        triglycerides  . Hypertension Brother   . Colon polyps Brother   . Diabetes Sister   . Liver disease Sister        fatty liver, cirrhosis  . Colon polyps Sister   . Kartagener's syndrome Sister   . Heart attack Son 23       rare genetic condition  .  Heart disease Neg Hx   . Esophageal cancer Neg Hx   . Stomach cancer Neg Hx     Social History   Social History Narrative   Lives with her boyfriend, Laddie Aquas.   Her ex-husband died of heart attack at age 37.    One son lives next door, one in [place], [place] and another in [place].   Social History   Tobacco Use  . Smoking status: Former Smoker    Types: Cigarettes    Quit date: 01/26/1998    Years since quitting: 21.9  . Smokeless tobacco: Never Used  Substance Use Topics  . Alcohol use: No     Current Meds  Medication Sig  . Ascorbic Acid (VITAMIN C) 100 MG tablet Take 100 mg by mouth daily.  Marland Kitchen aspirin 81 MG tablet Take 81 mg by mouth every other day.   . Calcium Carbonate-Vitamin D (CALCIUM-D) 600-400 MG-UNIT TABS Take by mouth every other day.   . fish oil-omega-3 fatty acids 1000 MG capsule Take 1 g by mouth every other day.  . fluticasone (FLONASE) 50 MCG/ACT nasal spray Place 2 sprays into both nostrils  daily. (Patient taking differently: Place 2 sprays into both nostrils as needed. )  . Garlic 854 MG TABS Take by mouth. 1000 mg daily  . loratadine (CLARITIN REDITABS) 10 MG dissolvable tablet Take 10 mg by mouth daily.    Marland Kitchen losartan-hydrochlorothiazide (HYZAAR) 100-25 MG tablet TAKE 1 TABLET BY MOUTH DAILY GENERIC EQUIVALENT FOR HYZAAR  . meloxicam (MOBIC) 15 MG tablet Take 1 tablet (15 mg total) by mouth daily as needed for pain. Take 1 tablet by mouth  daily  . Multiple Vitamin (MULTIVITAMIN PO) Take by mouth 1 day or 1 dose.    . pantoprazole (PROTONIX) 40 MG tablet Take 1 tablet by mouth two  times daily  . potassium chloride SA (KLOR-CON) 20 MEQ tablet Take 1 tablet (20 mEq total) by mouth daily.  . propranolol ER (INDERAL LA) 80 MG 24 hr capsule TAKE 1 CAPSULE BY MOUTH EVERY DAY  . pyridOXINE (VITAMIN B-6) 100 MG tablet Take 100 mg by mouth daily.   . vitamin B-12 (CYANOCOBALAMIN) 1000 MCG tablet Take 1,000 mcg by mouth daily.  . Zinc 100 MG TABS Take by mouth.   Current Facility-Administered Medications for the 12/26/19 encounter (Office Visit) with Ailene Ards, NP  Medication  . 0.9 %  sodium chloride infusion    ROS:  Review of Systems  Constitutional: Negative for malaise/fatigue.  Respiratory: Negative for shortness of breath.   Cardiovascular: Negative for chest pain.     Objective:   Today's Vitals: BP (!) 144/80   Pulse (!) 48   Temp (!) 97.3 F (36.3 C) (Temporal)   Ht 5' 4.25" (1.632 m)   Wt 240 lb 6.4 oz (109 kg)   SpO2 98%   BMI 40.94 kg/m  Vitals with BMI 12/26/2019 06/29/2019 06/29/2019  Height 5' 4.25" - 5\' 6"   Weight 240 lbs 6 oz - 240 lbs 3 oz  BMI 62.70 - 35.00  Systolic 938 182 993  Diastolic 80 78 83  Pulse 48 - 66     Physical Exam Vitals reviewed.  Constitutional:      General: She is not in acute distress.    Appearance: Normal appearance.  HENT:     Head: Normocephalic and atraumatic.  Neck:     Thyroid: No thyroid mass, thyromegaly  or thyroid tenderness.     Vascular: No carotid bruit.  Cardiovascular:     Rate and Rhythm: Normal rate and regular rhythm.     Pulses: Normal pulses.     Heart sounds: Normal heart sounds.  Pulmonary:     Effort: Pulmonary effort is normal.     Breath sounds: Normal breath sounds.  Skin:    General: Skin is warm and dry.  Neurological:     General: No focal deficit present.     Mental Status: She is alert and oriented to person, place, and time.  Psychiatric:        Mood and Affect: Mood normal.        Behavior: Behavior normal.        Judgment: Judgment normal.          Assessment and Plan   1. Essential hypertension   2. Multinodular goiter   3. Class 3 severe obesity with serious comorbidity and body mass index (BMI) of 40.0 to 44.9 in adult, unspecified obesity type ([place])   4. Needs flu shot      Plan: 1., 3. Blood pressure bit above goal today however she will continue on her current medication regimen and we will monitor this closely. If blood pressure remains elevated may need to consider adjusting medications. We will check blood work today for further evaluation including CMP, A1c, TSH, and CBC. 2. No nodules noted on palpation during my exam today. I did discuss her most recent ultrasound findings and that if she is still concerned we can certainly repeat the ultrasound, however I do not think this is necessary unless she is noted changes or growth to the nodules that are present. She is agreeable to holding off on ultrasound but is also agreeable to let me know if she notices any changes at which point ultrasound be ordered. 4. We will administer high-dose flu shot today.  Of note, she was concerned regarding cancer screenings. I encouraged her to follow-up with routine screenings. Appears that she is up-to-date with the use at this time, but will continue to follow-up as these become due.   Tests ordered Orders Placed This Encounter  Procedures  . CBC with  Differential/Platelets  . CMP with eGFR([facility])  . TSH  . Hemoglobin A1c      No orders of the defined types were placed in this encounter.   Patient to follow-up in 1 month or sooner as needed.  Ailene Ards, NP

## 2019-12-26 NOTE — Patient Instructions (Addendum)
To schedule an appointment with [facility] for your lab draw visit [email or web address] or Call: [phone]. Or you may go to [facility] as a walk-in. Their [place] location address is [address], [place], [place]. Their hours are Monday-Friday from 7:00AM-12:00PM and 1:00PM-5:00PM.     Influenza (Flu) Vaccine (Inactivated or Recombinant): What You Need to Know 1. Why get vaccinated? Influenza vaccine can prevent influenza (flu). Flu is a contagious disease that spreads around the [place] every year, usually between October and May. Anyone can get the flu, but it is more dangerous for some people. Infants and young children, people 24 years of age and older, pregnant women, and people with certain health conditions or a weakened immune system are at greatest risk of flu complications. Pneumonia, bronchitis, sinus infections and ear infections are examples of flu-related complications. If you have a medical condition, such as heart disease, cancer or diabetes, flu can make it worse. Flu can cause fever and chills, sore throat, muscle aches, fatigue, cough, headache, and runny or stuffy nose. Some people may have vomiting and diarrhea, though this is more common in children than adults. Each year thousands of people in the [place] die from flu, and many more are hospitalized. Flu vaccine prevents millions of illnesses and flu-related visits to the doctor each year. 2. Influenza vaccine CDC recommends everyone 62 months of age and older get vaccinated every flu season. Children 6 months through 53 years of age may need 2 doses during a single flu season. Everyone else needs only 1 dose each flu season. It takes about 2 weeks for protection to develop after vaccination. There are many flu viruses, and they are always changing. Each year a new flu vaccine is made to protect against three or four viruses that are likely to cause disease in the upcoming flu season. Even when  the vaccine doesn't exactly match these viruses, it may still provide some protection. Influenza vaccine does not cause flu. Influenza vaccine may be given at the same time as other vaccines. 3. Talk with your health care provider Tell your vaccine provider if the person getting the vaccine:  Has had an allergic reaction after a previous dose of influenza vaccine, or has any severe, life-threatening allergies.  Has ever had Guillain-Barr Syndrome (also called GBS). In some cases, your health care provider may decide to postpone influenza vaccination to a future visit. People with minor illnesses, such as a cold, may be vaccinated. People who are moderately or severely ill should usually wait until they recover before getting influenza vaccine. Your health care provider can give you more information. 4. Risks of a vaccine reaction  Soreness, redness, and swelling where shot is given, fever, muscle aches, and headache can happen after influenza vaccine.  There may be a very small increased risk of Guillain-Barr Syndrome (GBS) after inactivated influenza vaccine (the flu shot). Young children who get the flu shot along with pneumococcal vaccine (PCV13), and/or DTaP vaccine at the same time might be slightly more likely to have a seizure caused by fever. Tell your health care provider if a child who is getting flu vaccine has ever had a seizure. People sometimes faint after medical procedures, including vaccination. Tell your provider if you feel dizzy or have vision changes or ringing in the ears. As with any medicine, there is a very remote chance of a vaccine causing a severe allergic reaction, other serious injury, or death. 5. What if there is a serious problem? An  allergic reaction could occur after the vaccinated person leaves the clinic. If you see signs of a severe allergic reaction (hives, swelling of the face and throat, difficulty breathing, a fast heartbeat, dizziness, or weakness),  call 9-1-1 and get the person to the nearest hospital. For other signs that concern you, call your health care provider. Adverse reactions should be reported to the Vaccine Adverse Event Reporting System (VAERS). Your health care provider will usually file this report, or you can do it yourself. Visit the VAERS website at www.vaers.SamedayNews.es or call 7177798017.VAERS is only for reporting reactions, and VAERS staff do not give medical advice. 6. The National Vaccine Injury Compensation Program The [employer] Vaccine Injury Compensation Program (VICP) is a federal program that was created to compensate people who may have been injured by certain vaccines. Visit the VICP website at GoldCloset.com.ee or call 469-723-3184 to learn about the program and about filing a claim. There is a time limit to file a claim for compensation. 7. How can I learn more?  Ask your healthcare provider.  Call your local or state health department.  Contact the Centers for Disease Control and Prevention (CDC): ? Call 959-176-9646 (1-800-CDC-INFO) or ? Visit CDC's https://gibson.com/ Vaccine Information Statement (Interim) Inactivated Influenza Vaccine (09/09/2017) This information is not intended to replace advice given to you by your health care provider. Make sure you discuss any questions you have with your health care provider. Document Revised: 05/03/2018 Document Reviewed: 09/13/2017 Elsevier Patient Education  [place].

## 2019-12-29 ENCOUNTER — Ambulatory Visit: Payer: 59 | Admitting: Family Medicine

## 2019-12-29 DIAGNOSIS — I10 Essential (primary) hypertension: Secondary | ICD-10-CM | POA: Diagnosis not present

## 2019-12-29 DIAGNOSIS — E042 Nontoxic multinodular goiter: Secondary | ICD-10-CM | POA: Diagnosis not present

## 2019-12-30 LAB — COMPLETE METABOLIC PANEL WITH GFR
AG Ratio: 1.5 (calc) (ref 1.0–2.5)
ALT: 40 U/L — ABNORMAL HIGH (ref 6–29)
AST: 35 U/L (ref 10–35)
Albumin: 3.9 g/dL (ref 3.6–5.1)
Alkaline phosphatase (APISO): 89 U/L (ref 37–153)
BUN: 14 mg/dL (ref 7–25)
CO2: 30 mmol/L (ref 20–32)
Calcium: 9.9 mg/dL (ref 8.6–10.4)
Chloride: 105 mmol/L (ref 98–110)
Creat: 0.71 mg/dL (ref 0.50–0.99)
GFR, Est African American: 104 mL/min/{1.73_m2} (ref >=60)
GFR, Est Non African American: 89 mL/min/{1.73_m2} (ref >=60)
Globulin: 2.6 g/dL (calc) (ref 1.9–3.7)
Glucose, Bld: 103 mg/dL — ABNORMAL HIGH (ref 65–99)
Potassium: 3.8 mmol/L (ref 3.5–5.3)
Sodium: 143 mmol/L (ref 135–146)
Total Bilirubin: 0.5 mg/dL (ref 0.2–1.2)
Total Protein: 6.5 g/dL (ref 6.1–8.1)

## 2019-12-30 LAB — HEMOGLOBIN A1C
Hgb A1c MFr Bld: 5.7 % of total Hgb — ABNORMAL HIGH (ref <5.7)
Mean Plasma Glucose: 117 (calc)
eAG (mmol/L): 6.5 (calc)

## 2019-12-30 LAB — CBC WITH DIFFERENTIAL/PLATELET
Absolute Monocytes: 561 cells/uL (ref 200–950)
Basophils Absolute: 39 cells/uL (ref 0–200)
Basophils Relative: 0.7 %
Eosinophils Absolute: 132 cells/uL (ref 15–500)
Eosinophils Relative: 2.4 %
HCT: 38.6 % (ref 35.0–45.0)
Hemoglobin: 12.8 g/dL (ref 11.7–15.5)
Lymphs Abs: 2063 cells/uL (ref 850–3900)
MCH: 28.6 pg (ref 27.0–33.0)
MCHC: 33.2 g/dL (ref 32.0–36.0)
MCV: 86.4 fL (ref 80.0–100.0)
MPV: 9.6 fL (ref 7.5–12.5)
Monocytes Relative: 10.2 %
Neutro Abs: 2706 cells/uL (ref 1500–7800)
Neutrophils Relative %: 49.2 %
Platelets: 211 10*3/uL (ref 140–400)
RBC: 4.47 10*6/uL (ref 3.80–5.10)
RDW: 12 % (ref 11.0–15.0)
Total Lymphocyte: 37.5 %
WBC: 5.5 10*3/uL (ref 3.8–10.8)

## 2019-12-30 LAB — TSH: TSH: 0.94 mIU/L (ref 0.40–4.50)

## 2020-01-01 ENCOUNTER — Ambulatory Visit (INDEPENDENT_AMBULATORY_CARE_PROVIDER_SITE_OTHER): Payer: 59 | Admitting: Internal Medicine

## 2020-02-06 ENCOUNTER — Encounter (INDEPENDENT_AMBULATORY_CARE_PROVIDER_SITE_OTHER): Payer: Self-pay | Admitting: Internal Medicine

## 2020-02-06 ENCOUNTER — Other Ambulatory Visit: Payer: Self-pay

## 2020-02-06 ENCOUNTER — Ambulatory Visit (INDEPENDENT_AMBULATORY_CARE_PROVIDER_SITE_OTHER): Payer: Medicare Other | Admitting: Internal Medicine

## 2020-02-06 VITALS — BP 126/76 | HR 69 | Temp 97.0°F | Ht 64.25 in | Wt 236.8 lb

## 2020-02-06 DIAGNOSIS — Z6841 Body Mass Index (BMI) 40.0 and over, adult: Secondary | ICD-10-CM | POA: Diagnosis not present

## 2020-02-06 DIAGNOSIS — I10 Essential (primary) hypertension: Secondary | ICD-10-CM

## 2020-02-06 MED ORDER — LOSARTAN POTASSIUM-HCTZ 100-25 MG PO TABS: ORAL_TABLET | ORAL | 1 refills | Status: DC

## 2020-02-06 MED ORDER — PROPRANOLOL HCL ER 80 MG PO CP24: ORAL_CAPSULE | ORAL | 1 refills | Status: AC

## 2020-02-06 NOTE — Patient Instructions (Signed)
[facility] Dietary Recommendations for Weight Loss What to Avoid . Avoid added sugars o Often added sugar can be found in processed foods such as many condiments, dry cereals, cakes, cookies, chips, crisps, crackers, candies, sweetened drinks, etc.  o Read labels and AVOID/DECREASE use of foods with the following in their ingredient list: Sugar, fructose, high fructose corn syrup, sucrose, glucose, maltose, dextrose, molasses, cane sugar, brown sugar, any type of syrup, agave nectar, etc.   . Avoid snacking in between meals . Avoid foods made with flour o If you are going to eat food made with flour, choose those made with whole-grains; and, minimize your consumption as much as is tolerable . Avoid processed foods o These foods are generally stocked in the middle of the grocery store. Focus on shopping on the perimeter of the grocery.  . Avoid Meat  o We recommend following a plant-based diet at [facility]. Thus, we recommend avoiding meat as a general rule. Consider eating beans, legumes, eggs, and/or dairy products for regular protein sources o If you plan on eating meat limit to 4 ounces of meat at a time and choose lean options such as Fish, chicken, turkey. Avoid red meat intake such as pork and/or steak What to Include . Vegetables o GREEN LEAFY VEGETABLES: Kale, spinach, mustard greens, collard greens, cabbage, broccoli, etc. o OTHER: Asparagus, cauliflower, eggplant, carrots, peas, Brussel sprouts, tomatoes, bell peppers, zucchini, beets, cucumbers, etc. . Grains, seeds, and legumes o Beans: kidney beans, black eyed peas, garbanzo beans, black beans, pinto beans, etc. o Whole, unrefined grains: brown rice, barley, bulgur, oatmeal, etc. . Healthy fats  o Avoid highly processed fats such as vegetable oil o Examples of healthy fats: avocado, olives, virgin olive oil, dark chocolate (?72% Cocoa), nuts (peanuts, almonds, walnuts, cashews, pecans, etc.) . None to Low  Intake of Animal Sources of Protein o Meat sources: chicken, turkey, salmon, tuna. Limit to 4 ounces of meat at one time. o Consider limiting dairy sources, but when choosing dairy focus on: PLAIN Greek yogurt, cottage cheese, high-protein milk . Fruit o Choose berries  When to Eat . Intermittent Fasting: o Choosing not to eat for a specific time period, but DO FOCUS ON HYDRATION when fasting o Multiple Techniques: - Time Restricted Eating: eat 3 meals in a day, each meal lasting no more than 60 minutes, no snacks between meals - 16-18 hour fast: fast for 16 to 18 hours up to 7 days a week. Often suggested to start with 2-3 nonconsecutive days per week.  . Remember the time you sleep is counted as fasting.  . Examples of eating schedule: Fast from 7:00pm-11:00am. Eat between 11:00am-7:00pm.  - 24-hour fast: fast for 24 hours up to every other day. Often suggested to start with 1 day per week . Remember the time you sleep is counted as fasting . Examples of eating schedule:  o Eating day: eat 2-3 meals on your eating day. If doing 2 meals, each meal should last no more than 90 minutes. If doing 3 meals, each meal should last no more than 60 minutes. Finish last meal by 7:00pm. o Fasting day: Fast until 7:00pm.  o IF YOU FEEL UNWELL FOR ANY REASON/IN ANY WAY WHEN FASTING, STOP FASTING BY EATING A NUTRITIOUS SNACK OR LIGHT MEAL o ALWAYS FOCUS ON HYDRATION DURING FASTS - Acceptable Hydration sources: water, broths, tea/coffee (black tea/coffee is best but using a small amount of whole-fat dairy products in coffee/tea is acceptable).  -   Poor Hydration Sources: anything with sugar or artificial sweeteners added to it  These recommendations have been developed for patients that are actively receiving medical care from either Dr. [facility] or Sarah Gray, DNP, NP-C at [facility]. These recommendations are developed for patients with specific medical conditions and are not meant to be  distributed or used by others that are not actively receiving care from either provider listed above at [facility]. It is not appropriate to participate in the above eating plans without proper medical supervision.   Reference: Fung, J. The obesity code. [place]: [place]; 2016.   

## 2020-02-06 NOTE — Progress Notes (Signed)
Metrics: Intervention Frequency ACO  Documented Smoking Status Yearly  Screened one or more times in 24 months  Cessation Counseling or  Active cessation medication Past 24 months  Past 24 months   Guideline developer: UpToDate (See UpToDate for funding source) Date Released: 2014       Wellness Office Visit  Subjective:  Patient ID: Catherine Navarro, female    DOB: Dec 02, 1955  Age: 65 y.o. MRN: [id number]  CC: This lady comes in to follow-up regarding her blood work.  She was seen for the first time by Judson Roch in November. HPI Her blood work shows that she is prediabetic with a hemoglobin A1c of 5.7%.  She also had elevated ALT levels which have been persistent over the years. She is hypertensive.  Past Medical History:  Diagnosis Date  . Abdominal pain 11/27/2010  . Accessory skin tags 01/25/2013  . Allergy   . Anemia    past hx  . Anisocoria    right eye  . Cancer ([place])    skin cancer removed from nose   . Colon polyps 12/22/2010   Tubular Adenoma and Hyperplastic Polyp  . DDD (degenerative disc disease), cervical   . Elevated LFTs 11/27/2010  . Family history of colon cancer   . Fatty infiltration of liver   . Gastritis   . GERD (gastroesophageal reflux disease)   . Hiatal hernia   . HTN (hypertension)   . Hypothyroidism    with nodules  . IBS (irritable bowel syndrome)   . Muscle spasm of back 06/04/2015  . Osteoarthritis   . Pancreatitis   . Sinusitis    Past Surgical History:  Procedure Laterality Date  . ANAL FISTULECTOMY    . CHOLECYSTECTOMY    . COLONOSCOPY    . COLONOSCOPY W/ BIOPSIES    . ESOPHAGOGASTRODUODENOSCOPY    . EYE SURGERY    . PATELLA RECONSTRUCTION     pins  . POLYPECTOMY    . 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 TUBAL LIGATION    . UPPER GASTROINTESTINAL ENDOSCOPY       Family History  Problem Relation Age of Onset  . Breast cancer Maternal Aunt 65  . Diabetes Mother   . Irritable bowel syndrome Mother   . Stroke Mother   .  Heart attack Paternal Grandfather   . COPD Father        emphysema  . Cancer Father   . Alzheimer's disease Paternal Grandmother   . Alzheimer's disease Maternal Aunt   . Colon cancer Sister   . Liver cancer Sister   . Cancer Brother 63       colon-colorectal colon cancer  . Colon cancer Brother   . Rectal cancer Brother   . Hyperlipidemia Sister   . Hypertension Sister   . Colon polyps Sister   . Kidney cancer Sister   . Diabetes Maternal Grandmother   . Hyperlipidemia Brother        triglycerides  . Hypertension Brother   . Colon polyps Brother   . Diabetes Sister   . Liver disease Sister        fatty liver, cirrhosis  . Colon polyps Sister   . Kartagener's syndrome Sister   . Heart attack Son 33       rare genetic condition  . Heart disease Neg Hx   . Esophageal cancer Neg Hx   . Stomach cancer Neg Hx     Social History   Social History Narrative   Lives alone.Her  ex-husband died of heart attack at age 82. 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   One son lives next door, one in [place], [place] and another in [place].   Social History   Tobacco Use  . Smoking status: Former Smoker    Types: Cigarettes    Quit date: 01/26/1998    Years since quitting: 22.0  . Smokeless tobacco: Never Used  Substance Use Topics  . Alcohol use: No    Current Meds  Medication Sig  . Ascorbic Acid (VITAMIN C) 100 MG tablet Take 100 mg by mouth daily.  Marland Kitchen aspirin 81 MG tablet Take 81 mg by mouth every other day.  . Calcium Carbonate-Vitamin D (CALCIUM-D) 600-400 MG-UNIT TABS Take by mouth every other day.   . clindamycin (CLEOCIN) 300 MG capsule clindamycin HCl 300 mg capsule  TAKE 3 CAPSULES 1 HOUR PRIOR TO DENTAL WORK  . fish oil-omega-3 fatty acids 1000 MG capsule Take 1 g by mouth every other day.  . fluticasone (FLONASE) 50 MCG/ACT nasal spray Place 2 sprays into both nostrils daily. (Patient taking  differently: Place 2 sprays into both nostrils as needed.)  . Garlic 400 MG TABS Take by mouth. 1000 mg daily  . loratadine (CLARITIN REDITABS) 10 MG dissolvable tablet Take 10 mg by mouth daily.  . meloxicam (MOBIC) 15 MG tablet Take 1 tablet (15 mg total) by mouth daily as needed for pain. Take 1 tablet by mouth  daily  . Multiple Vitamin (MULTIVITAMIN PO) Take by mouth 1 day or 1 dose.  . pantoprazole (PROTONIX) 40 MG tablet Take 1 tablet by mouth two  times daily  . potassium chloride SA (KLOR-CON) 20 MEQ tablet Take 1 tablet (20 mEq total) by mouth daily.  Marland Kitchen pyridOXINE (VITAMIN B-6) 100 MG tablet Take 100 mg by mouth daily.   . vitamin B-12 (CYANOCOBALAMIN) 1000 MCG tablet Take 1,000 mcg by mouth daily.  . Zinc 100 MG TABS Take by mouth.  . [DISCONTINUED] losartan-hydrochlorothiazide (HYZAAR) 100-25 MG tablet TAKE 1 TABLET BY MOUTH DAILY GENERIC EQUIVALENT FOR HYZAAR  . [DISCONTINUED] propranolol ER (INDERAL LA) 80 MG 24 hr capsule TAKE 1 CAPSULE BY MOUTH EVERY DAY   Current Facility-Administered Medications for the 02/06/20 encounter (Office Visit) with Wilson Singer, MD  Medication  . 0.9 %  sodium chloride infusion      Depression screen [facility] 2/9 12/26/2019 06/29/2019 02/07/2019 12/29/2018 06/30/2018  Decreased Interest 0 0 0 0 0  Down, Depressed, Hopeless 0 0 0 0 0  PHQ - 2 Score 0 0 0 0 0  Altered sleeping 0 - - - -  Tired, decreased energy 0 - - - -  Change in appetite 0 - - - -  Feeling bad or failure about yourself  0 - - - -  Trouble concentrating 0 - - - -  Moving slowly or fidgety/restless 0 - - - -  Suicidal thoughts 0 - - - -  PHQ-9 Score 0 - - - -  Difficult doing work/chores Not difficult at all - - - -     Objective:   Today's Vitals: BP 126/76   Pulse 69   Temp (!) 97 F (36.1 C) (Temporal)   Ht 5' 4.25" (1.632 m)   Wt 236 lb 12.8 oz (107.4 kg)   SpO2 98%   BMI 40.33 kg/m  Vitals with BMI 02/06/2020 12/26/2019 06/29/2019  Height 5' 4.25" 5' 4.25" -   Weight 236 lbs 13  oz 240 lbs 6 oz -  BMI 70.35 00.93 -  Systolic 818 299 371  Diastolic 76 80 78  Pulse 69 48 -     Physical Exam  She is morbidly obese.  Blood pressure is acceptable.     Assessment   1. Essential hypertension   2. Class 3 severe obesity with serious comorbidity and body mass index (BMI) of 40.0 to 44.9 in adult, unspecified obesity type ([place])       Tests ordered No orders of the defined types were placed in this encounter.    Plan: 1. Today we discussed the philosophy of the practice and the importance of losing weight and reducing her most likely fatty liver disease that she does have.  If the ALT is persistently elevated, we will check further blood work to investigate this. 2. I spent most of the visit discussing nutrition and the concept of intermittent fasting combined with a plant-based diet, ensuring hydration. 3. Follow-up with Judson Roch in about 3 months.  Today I spent 45 minutes with this patient discussing her blood work in detail and also nutrition.   Meds ordered this encounter  Medications  . losartan-hydrochlorothiazide (HYZAAR) 100-25 MG tablet    Sig: TAKE 1 TABLET BY MOUTH DAILY GENERIC EQUIVALENT FOR HYZAAR    Dispense:  90 tablet    Refill:  1  . propranolol ER (INDERAL LA) 80 MG 24 hr capsule    Sig: TAKE 1 CAPSULE BY MOUTH EVERY DAY    Dispense:  90 capsule    Refill:  1    Desiree Daise Luther Parody, MD

## 2020-02-16 ENCOUNTER — Other Ambulatory Visit: Payer: Self-pay | Admitting: Family Medicine

## 2020-02-16 DIAGNOSIS — Z1231 Encounter for screening mammogram for malignant neoplasm of breast: Secondary | ICD-10-CM

## 2020-03-05 ENCOUNTER — Other Ambulatory Visit (INDEPENDENT_AMBULATORY_CARE_PROVIDER_SITE_OTHER): Payer: Self-pay | Admitting: Internal Medicine

## 2020-03-05 MED ORDER — VALSARTAN-HYDROCHLOROTHIAZIDE 80-12.5 MG PO TABS: 1.0000 | ORAL_TABLET | Freq: Every day | ORAL | 1 refills | Status: AC

## 2020-03-08 ENCOUNTER — Other Ambulatory Visit: Payer: Self-pay

## 2020-03-08 ENCOUNTER — Ambulatory Visit
Admission: RE | Admit: 2020-03-08 | Discharge: 2020-03-08 | Disposition: A | Discharge status: Home / Self Care | Payer: Medicare Other | Source: Ambulatory Visit

## 2020-03-08 DIAGNOSIS — Z1231 Encounter for screening mammogram for malignant neoplasm of breast: Secondary | ICD-10-CM

## 2020-04-07 IMAGING — MG DIGITAL SCREENING BILAT W/ TOMO W/ CAD
8 series · 8 of 24 positions shown · non-contrast
Comparison: Previous exam(s).

CLINICAL DATA: Screening.

EXAM:
DIGITAL SCREENING BILATERAL MAMMOGRAM WITH TOMO AND CAD

[L CC synth-2D]
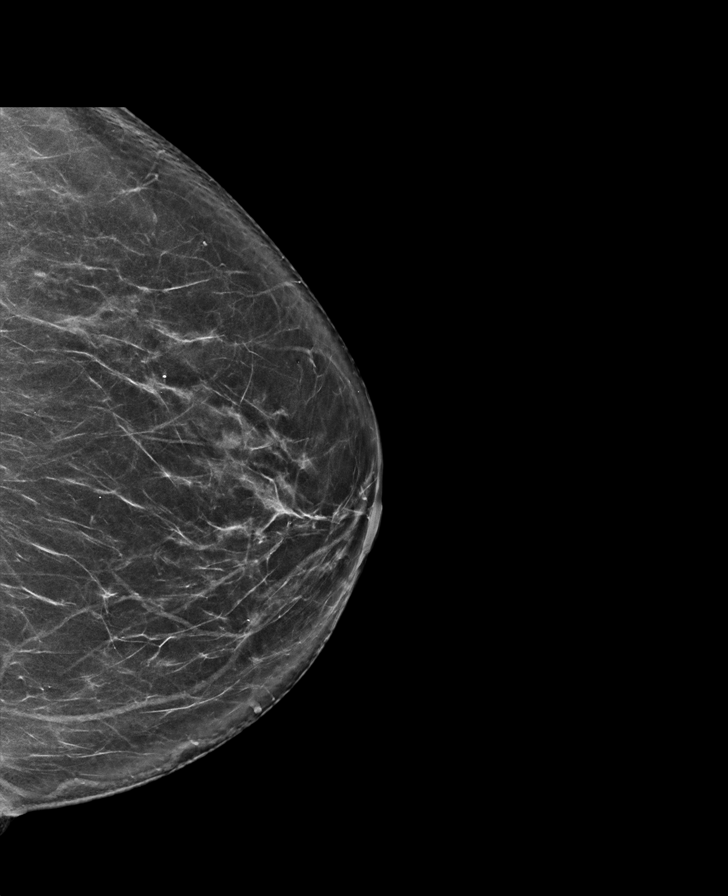

[L MLO synth-2D]
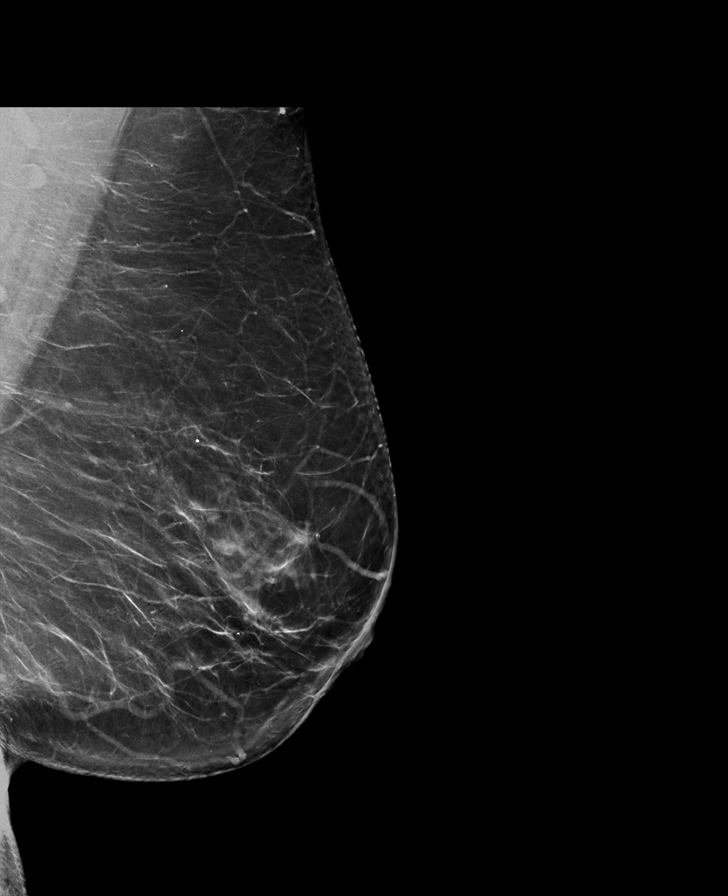

[R CC synth-2D]
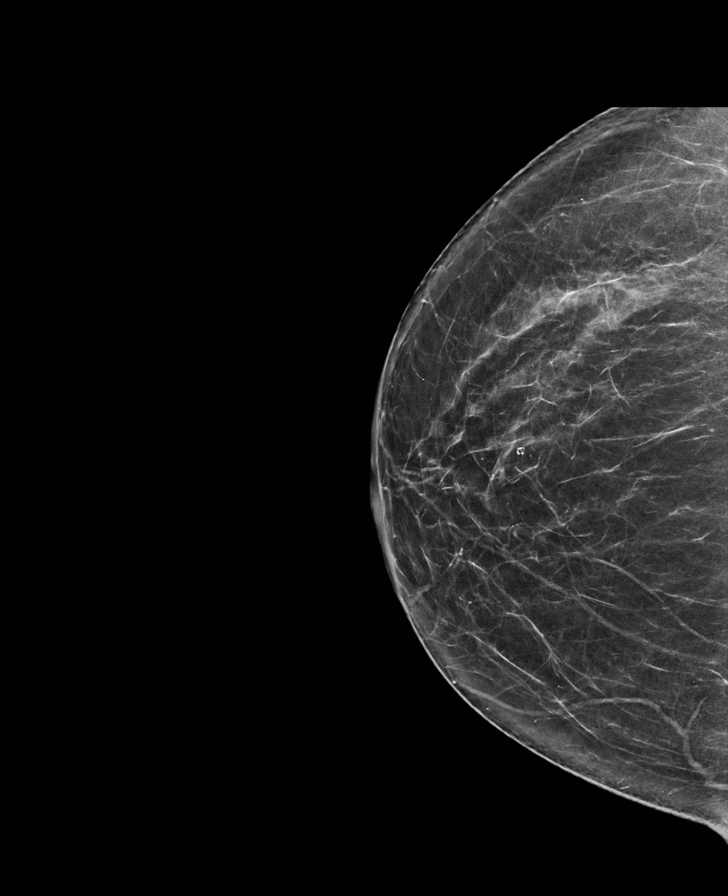

[R MLO synth-2D]
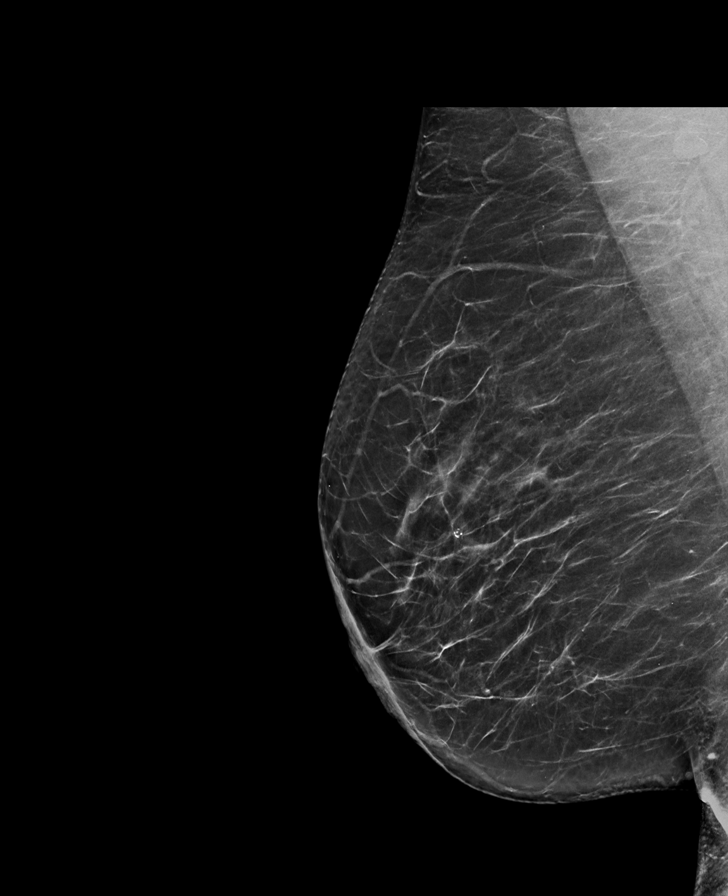

[R CC tomo · tomo slice 35/70.0]
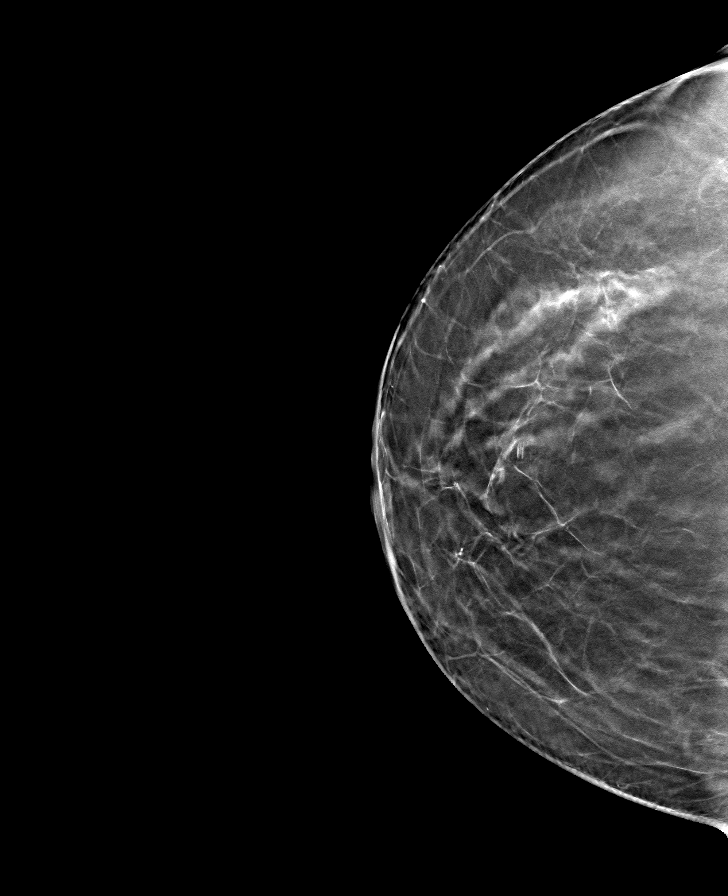

[L MLO tomo · tomo slice 43/85.0]
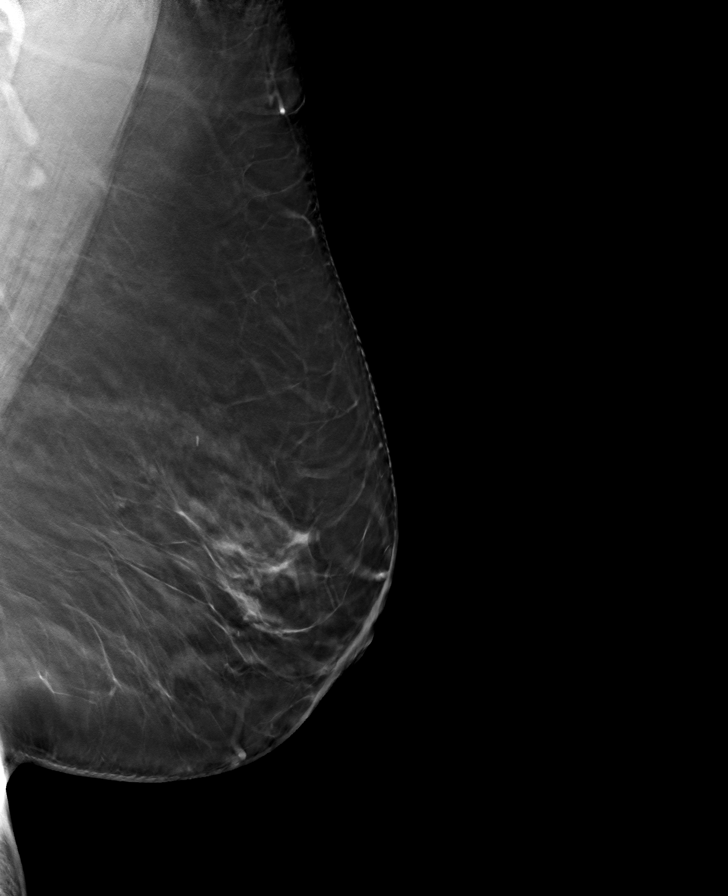

[R MLO tomo · tomo slice 40/79.0]
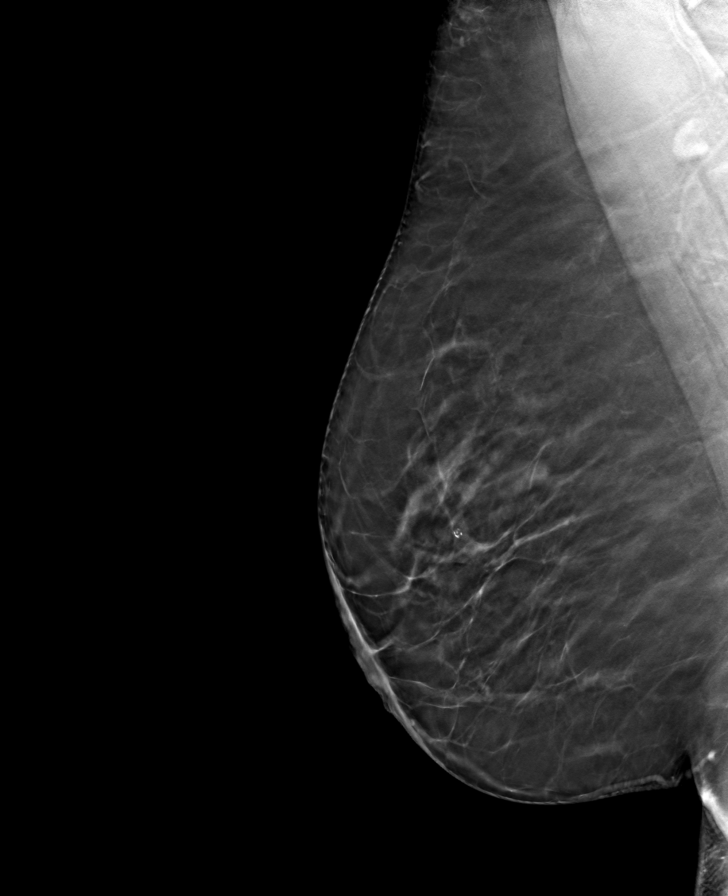

[L CC tomo · tomo slice 37/74.0]
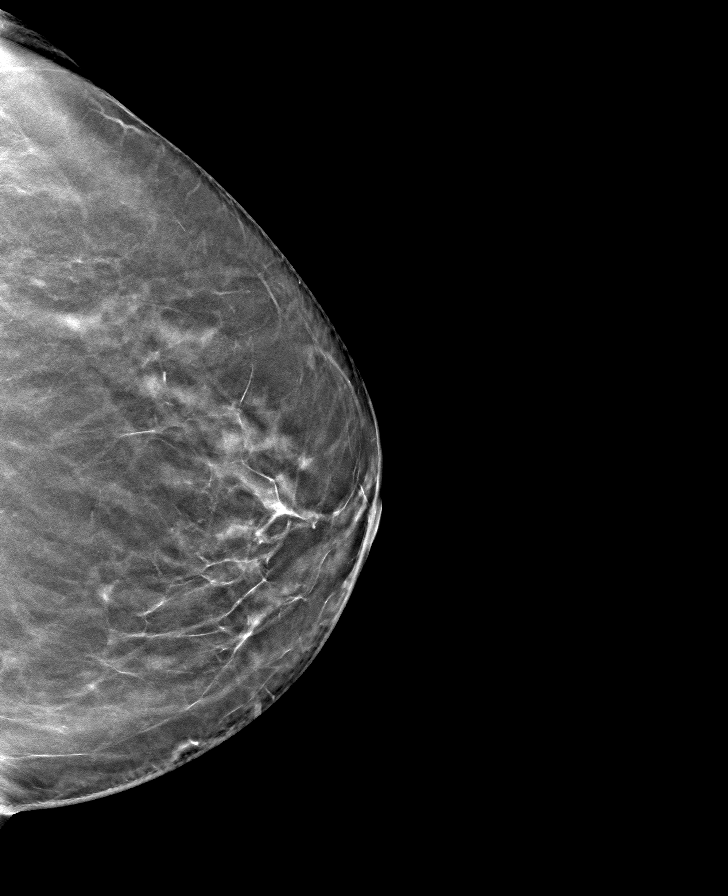

[8 of 24 positions shown; findings below may reference images not displayed]

ACR Breast Density Category b: There are scattered areas of
fibroglandular density.
FINDINGS: There are no findings suspicious for malignancy. Images were
processed with CAD.
IMPRESSION: No mammographic evidence of malignancy. A result letter of this
screening mammogram will be mailed directly to the patient.

RECOMMENDATION:
Screening mammogram in one year. (Code:[id number])

BI-RADS CATEGORY  1: Negative.

## 2020-05-14 ENCOUNTER — Ambulatory Visit (INDEPENDENT_AMBULATORY_CARE_PROVIDER_SITE_OTHER): Payer: Medicare Other | Admitting: Internal Medicine

## 2021-01-26 DIAGNOSIS — C50919 Malignant neoplasm of unspecified site of unspecified female breast: Secondary | ICD-10-CM

## 2021-01-26 HISTORY — DX: Malignant neoplasm of unspecified site of unspecified female breast: C50.919

## 2021-05-21 ENCOUNTER — Other Ambulatory Visit: Payer: Self-pay | Admitting: Radiology

## 2021-05-21 DIAGNOSIS — Z1231 Encounter for screening mammogram for malignant neoplasm of breast: Secondary | ICD-10-CM

## 2021-05-23 ENCOUNTER — Ambulatory Visit
Admission: RE | Admit: 2021-05-23 | Discharge: 2021-05-23 | Disposition: A | Discharge status: Home / Self Care | Payer: BC Managed Care – PPO | Source: Ambulatory Visit

## 2021-05-23 DIAGNOSIS — Z1231 Encounter for screening mammogram for malignant neoplasm of breast: Secondary | ICD-10-CM

## 2021-05-26 ENCOUNTER — Other Ambulatory Visit: Payer: Self-pay | Admitting: Family Medicine

## 2021-05-26 DIAGNOSIS — R928 Other abnormal and inconclusive findings on diagnostic imaging of breast: Secondary | ICD-10-CM

## 2021-06-03 ENCOUNTER — Other Ambulatory Visit: Payer: Self-pay | Admitting: Family Medicine

## 2021-06-03 DIAGNOSIS — R928 Other abnormal and inconclusive findings on diagnostic imaging of breast: Secondary | ICD-10-CM

## 2021-06-05 ENCOUNTER — Ambulatory Visit
Admission: RE | Admit: 2021-06-05 | Discharge: 2021-06-05 | Disposition: A | Discharge status: Home / Self Care | Payer: Medicare Other | Source: Ambulatory Visit | Attending: Family Medicine | Admitting: Family Medicine

## 2021-06-05 ENCOUNTER — Ambulatory Visit
Admission: RE | Admit: 2021-06-05 | Discharge: 2021-06-05 | Disposition: A | Discharge status: Home / Self Care | Payer: BC Managed Care – PPO | Source: Ambulatory Visit | Attending: Family Medicine | Admitting: Family Medicine

## 2021-06-05 ENCOUNTER — Other Ambulatory Visit: Payer: Self-pay | Admitting: Family Medicine

## 2021-06-05 DIAGNOSIS — R928 Other abnormal and inconclusive findings on diagnostic imaging of breast: Secondary | ICD-10-CM

## 2021-06-05 DIAGNOSIS — N6320 Unspecified lump in the left breast, unspecified quadrant: Secondary | ICD-10-CM

## 2021-06-09 ENCOUNTER — Ambulatory Visit
Admission: RE | Admit: 2021-06-09 | Discharge: 2021-06-09 | Disposition: A | Discharge status: Home / Self Care | Payer: BC Managed Care – PPO | Source: Ambulatory Visit | Attending: Family Medicine | Admitting: Family Medicine

## 2021-06-09 DIAGNOSIS — N6320 Unspecified lump in the left breast, unspecified quadrant: Secondary | ICD-10-CM

## 2021-06-13 ENCOUNTER — Other Ambulatory Visit: Payer: Self-pay | Admitting: Surgery

## 2021-06-13 DIAGNOSIS — Z853 Personal history of malignant neoplasm of breast: Secondary | ICD-10-CM

## 2021-06-18 ENCOUNTER — Other Ambulatory Visit: Payer: Self-pay | Admitting: Surgery

## 2021-06-18 DIAGNOSIS — Z853 Personal history of malignant neoplasm of breast: Secondary | ICD-10-CM

## 2021-06-19 ENCOUNTER — Telehealth: Payer: Self-pay | Admitting: Hematology and Oncology

## 2021-06-19 ENCOUNTER — Encounter: Payer: Self-pay | Admitting: *Deleted

## 2021-06-19 DIAGNOSIS — C50912 Malignant neoplasm of unspecified site of left female breast: Secondary | ICD-10-CM

## 2021-06-19 NOTE — Telephone Encounter (Signed)
Scheduled appt per 5/25 referral. Pt is aware of appt date and time. Pt is aware to arrive 15 mins prior to appt time and to bring and updated insurance card. Pt is aware of appt location.

## 2021-06-19 NOTE — Progress Notes (Signed)
Location of Breast Cancer: Malignant neoplasm of left breast in female, estrogen receptor positive  Histology per Pathology Report:  (Definitive pathology pending upcoming surgery) 06/09/2021 Breast, left, needle core biopsy, upper inner quadrant at 10 o'clock, ribbon clip - INVASIVE MAMMARY CARCINOMA. SEE NOTE Diagnosis Note Carcinoma measures 0.6 cm in greatest linear dimension and appears grade 2.  Receptor Status: ER(100%), PR (100%), Her2-neu (Equivocal), Ki-67(15%)  Did patient present with symptoms (if so, please note symptoms) or was this found on screening mammography?: A recent screening mammography found a very small mass in the right breast. Patient underwent further imaging showing a 6 mm mass at the 10 o'clock position of the left breast.   Past/Anticipated interventions by surgeon, if any:  07/03/2021 --Dr. Coralie Keens Scheduled for: LEFT BREAST LUMPECTOMY WITH RADIOACTIVE SEED AND SENTINEL LYMPH NODE BIOPSY  06/13/2021 --Dr. Coralie Keens (office visit) We discussed the multidisciplinary treatment of breast cancer.  From a surgical standpoint we then discussed breast conservation versus mastectomy.  She is interested in breast conservation.  I next discussed proceeding with a radioactive seed guided left breast lumpectomy and sentinel lymph node biopsy.  I explained the reasonings for evaluating the lymph nodes.  I explained the surgical procedure in detail.  She will be referred to the cancer center to see medical and radiation oncology as well as physical therapy.  I gave him a copy of the pathology results.  All questions were answered. Surgery will be scheduled  Past/Anticipated interventions by medical oncology, if any:  Scheduled for consultation with Dr. Nicholas Lose on 06/25/2021  Lymphedema issues, if any:  None    Pain issues, if any:  Reports soreness/tenderness to the breast since biopsy. Denies any range of motion restrictions  SAFETY  ISSUES: Prior radiation? No Pacemaker/ICD? No Possible current pregnancy? No--postmenopausal Is the patient on methotrexate? No  Current Complaints / other details:  Nothing else of note

## 2021-06-20 ENCOUNTER — Ambulatory Visit
Admission: RE | Admit: 2021-06-20 | Discharge: 2021-06-20 | Disposition: A | Discharge status: Home / Self Care | Payer: BC Managed Care – PPO | Source: Ambulatory Visit | Attending: Radiation Oncology | Admitting: Radiation Oncology

## 2021-06-20 ENCOUNTER — Encounter: Payer: Self-pay | Admitting: Radiation Oncology

## 2021-06-20 DIAGNOSIS — C50412 Malignant neoplasm of upper-outer quadrant of left female breast: Secondary | ICD-10-CM | POA: Insufficient documentation

## 2021-06-20 DIAGNOSIS — Z170 Estrogen receptor positive status [ER+]: Secondary | ICD-10-CM

## 2021-06-20 NOTE — Progress Notes (Signed)
Radiation Oncology         [phone] ________________________________  MyChart Video Visit  The patient opted for telemedicine to maximize safety during the pandemic.  MyChart video was not obtainable.  Initial Outpatient Consultation  Name: Catherine Navarro MRN: [id number]  Date: 06/20/2021  DOB: 02-25-1955  AY:[id number], Nimish C, MD (Inactive)  Coralie Keens, MD   REFERRING PHYSICIAN: Coralie Keens, MD  DIAGNOSIS:    ICD-10-CM   1. Malignant neoplasm of upper-outer quadrant of left breast in female, estrogen receptor positive ([place])  C50.412    Z17.0      Left Breast UOQ Invasive Mammary Carcinoma, ER+ / PR+ / Her2 pending, Grade 2 [id number]  CHIEF COMPLAINT: Here to discuss management of left breast cancer  HISTORY OF PRESENT ILLNESS::Catherine Navarro is a 66 y.o. female who presented with a left breast abnormality on the following imaging: bilateral screening mammogram on the date of 05/23/21.  No symptoms, if any, were reported at that time. Diagnostic left breast mammogram and ultrasound on 06/05/21 further revealed a 6 x 6 x 4 mm irregular echogenic mass in the 10 o'clock position of the left breast. No left axillary adenopathy was appreciated.   Biopsy of the 10 o'clock left breast on date of 06/09/21 showed grade 2 invasive mammary carcinoma.  ER status: 100% positive; PR status 100% positive (both with strong staining intensity); Proliferation maker Ki67 at 15%; Her2 status pending. No lymph nodes were examined.  Accordingly, was referred to Dr. Ninfa Linden on 06/13/21 to discuss surgical options. Following review of the risks and benefits, the patient agreed to proceed with left breast lumpectomy and SLN biopsies under the care of Dr. Ninfa Linden. She is scheduled for surgery on 07/03/21.  She lives in [place], [place].   PREVIOUS RADIATION THERAPY: No  PAST MEDICAL HISTORY:  has a past medical history of Abdominal pain (11/27/2010), Accessory skin tags  (01/25/2013), Allergy, Anemia, Anisocoria, Cancer ([place]), Colon polyps (12/22/2010), DDD (degenerative disc disease), cervical, Elevated LFTs (11/27/2010), Family history of colon cancer, Fatty infiltration of liver, Gastritis, GERD (gastroesophageal reflux disease), Hiatal hernia, HTN (hypertension), Hypothyroidism, IBS (irritable bowel syndrome), Muscle spasm of back (06/04/2015), Osteoarthritis, Pancreatitis, and Sinusitis.    PAST SURGICAL HISTORY: Past Surgical History:  Procedure Laterality Date   ANAL FISTULECTOMY     CHOLECYSTECTOMY     COLONOSCOPY     COLONOSCOPY W/ BIOPSIES     ESOPHAGOGASTRODUODENOSCOPY     EYE SURGERY     PATELLA RECONSTRUCTION     pins   POLYPECTOMY     REPLACEMENT TOTAL KNEE Right 12/2017   TUBAL LIGATION     UPPER GASTROINTESTINAL ENDOSCOPY      FAMILY HISTORY: family history includes Alzheimer's disease in her maternal aunt and paternal grandmother; Breast cancer (age of onset: 83) in her maternal aunt; COPD in her father; Cancer in her father; Cancer (age of onset: 54) in her brother; Colon cancer in her brother and sister; Colon polyps in her brother, sister, and sister; Diabetes in her maternal grandmother, mother, and sister; Heart attack in her paternal grandfather; Heart attack (age of onset: 58) in her son; Hyperlipidemia in her brother and sister; Hypertension in her brother and sister; Irritable bowel syndrome in her mother; Kartagener's syndrome in her sister; Kidney cancer in her sister; Liver cancer in her sister; Liver disease in her sister; Rectal cancer in her brother; Stroke in her mother. family history of breast cancer in her maternal aunt  SOCIAL HISTORY:  reports that she  quit smoking about 23 years ago. Her smoking use included cigarettes. She has been exposed to tobacco smoke. She has never used smokeless tobacco. She reports that she does not drink alcohol and does not use drugs.  ALLERGIES: Other, Amoxicillin, and Codeine  MEDICATIONS:   Current Outpatient Medications  Medication Sig Dispense Refill   Ascorbic Acid (VITAMIN C) 100 MG tablet Take 100 mg by mouth daily.     aspirin 81 MG tablet Take 81 mg by mouth every other day.     Calcium Carbonate-Vitamin D (CALCIUM-D) 600-400 MG-UNIT TABS Take by mouth every other day.      clindamycin (CLEOCIN) 300 MG capsule clindamycin HCl 300 mg capsule  TAKE 3 CAPSULES 1 HOUR PRIOR TO DENTAL WORK     fish oil-omega-3 fatty acids 1000 MG capsule Take 1 g by mouth every other day.     fluticasone (FLONASE) 50 MCG/ACT nasal spray Place 2 sprays into both nostrils daily. (Patient taking differently: Place 2 sprays into both nostrils as needed.) 48 g 3   Garlic 157 MG TABS Take by mouth. 1000 mg daily     loratadine (CLARITIN REDITABS) 10 MG dissolvable tablet Take 10 mg by mouth daily.     meloxicam (MOBIC) 15 MG tablet Take 1 tablet (15 mg total) by mouth daily as needed for pain. Take 1 tablet by mouth  daily 90 tablet 1   Multiple Vitamin (MULTIVITAMIN PO) Take by mouth 1 day or 1 dose.     pantoprazole (PROTONIX) 40 MG tablet Take 1 tablet by mouth two  times daily 180 tablet 3   potassium chloride SA (KLOR-CON) 20 MEQ tablet Take 1 tablet (20 mEq total) by mouth daily. 90 tablet 1   propranolol ER (INDERAL LA) 80 MG 24 hr capsule TAKE 1 CAPSULE BY MOUTH EVERY DAY 90 capsule 1   pyridOXINE (VITAMIN B-6) 100 MG tablet Take 100 mg by mouth daily.      valsartan-hydrochlorothiazide (DIOVAN-HCT) 80-12.5 MG tablet Take 1 tablet by mouth daily. 90 tablet 1   vitamin B-12 (CYANOCOBALAMIN) 1000 MCG tablet Take 1,000 mcg by mouth daily.     Zinc 100 MG TABS Take by mouth.     Current Facility-Administered Medications  Medication Dose Route Frequency Provider Last Rate Last Admin   0.9 %  sodium chloride infusion  500 mL Intravenous Continuous Pyrtle, Lajuan Lines, MD        REVIEW OF SYSTEMS: As above in HPI.   PHYSICAL EXAM:  vitals were not taken for this visit.   General: Alert and  oriented, in no acute distress    LABORATORY DATA:  Lab Results  Component Value Date   WBC 5.5 12/29/2019   HGB 12.8 12/29/2019   HCT 38.6 12/29/2019   MCV 86.4 12/29/2019   PLT 211 12/29/2019   CMP     Component Value Date/Time   NA 143 12/29/2019 0818   NA 143 06/29/2019 0950   K 3.8 12/29/2019 0818   CL 105 12/29/2019 0818   CO2 30 12/29/2019 0818   GLUCOSE 103 (H) 12/29/2019 0818   BUN 14 12/29/2019 0818   BUN 19 06/29/2019 0950   CREATININE 0.71 12/29/2019 0818   CALCIUM 9.9 12/29/2019 0818   PROT 6.5 12/29/2019 0818   PROT 6.9 06/29/2019 0950   ALBUMIN 4.5 06/29/2019 0950   AST 35 12/29/2019 0818   ALT 40 (H) 12/29/2019 0818   ALKPHOS 103 06/29/2019 0950   BILITOT 0.5 12/29/2019 0818   BILITOT 0.5  06/29/2019 0950   GFRNONAA 89 12/29/2019 0818   GFRAA 104 12/29/2019 0818         RADIOGRAPHY: US BREAST LTD UNI LEFT INC AXILLA  Result Date: 06/05/2021 CLINICAL DATA:  Patient with indeterminate left breast mass. EXAM: DIGITAL DIAGNOSTIC UNILATERAL LEFT MAMMOGRAM WITH TOMOSYNTHESIS AND CAD; ULTRASOUND LEFT BREAST LIMITED TECHNIQUE: Left digital diagnostic mammography and breast tomosynthesis was performed. The images were evaluated with computer-aided detection.; Targeted ultrasound examination of the left breast was performed. COMPARISON:  Previous exam(s). ACR Breast Density Category c: The breast tissue is heterogeneously dense, which may obscure small masses. FINDINGS: Within the upper inner left breast posterior depth there is a persistent small irregular mass. Targeted ultrasound is performed, showing a 6 x 6 x 4 mm irregular echogenic mass left breast 10 o'clock position corresponding with mammographic abnormality. No left axillary adenopathy. IMPRESSION: Suspicious left breast mass 10 o'clock position. RECOMMENDATION: Ultrasound-guided core needle biopsy suspicious left breast mass 10 o'clock position. I have discussed the findings and recommendations with the  patient. If applicable, a reminder letter will be sent to the patient regarding the next appointment. BI-RADS CATEGORY  4: Suspicious. Electronically Signed   By: Lovey Newcomer M.D.   On: 06/05/2021 13:51  MM DIAG BREAST TOMO UNI LEFT  Result Date: 06/05/2021 CLINICAL DATA:  Patient with indeterminate left breast mass. EXAM: DIGITAL DIAGNOSTIC UNILATERAL LEFT MAMMOGRAM WITH TOMOSYNTHESIS AND CAD; ULTRASOUND LEFT BREAST LIMITED TECHNIQUE: Left digital diagnostic mammography and breast tomosynthesis was performed. The images were evaluated with computer-aided detection.; Targeted ultrasound examination of the left breast was performed. COMPARISON:  Previous exam(s). ACR Breast Density Category c: The breast tissue is heterogeneously dense, which may obscure small masses. FINDINGS: Within the upper inner left breast posterior depth there is a persistent small irregular mass. Targeted ultrasound is performed, showing a 6 x 6 x 4 mm irregular echogenic mass left breast 10 o'clock position corresponding with mammographic abnormality. No left axillary adenopathy. IMPRESSION: Suspicious left breast mass 10 o'clock position. RECOMMENDATION: Ultrasound-guided core needle biopsy suspicious left breast mass 10 o'clock position. I have discussed the findings and recommendations with the patient. If applicable, a reminder letter will be sent to the patient regarding the next appointment. BI-RADS CATEGORY  4: Suspicious. Electronically Signed   By: Lovey Newcomer M.D.   On: 06/05/2021 13:51  MM 3D SCREEN BREAST BILATERAL  Result Date: 05/23/2021 CLINICAL DATA:  Screening. EXAM: DIGITAL SCREENING BILATERAL MAMMOGRAM WITH TOMOSYNTHESIS AND CAD TECHNIQUE: Bilateral screening digital craniocaudal and mediolateral oblique mammograms were obtained. Bilateral screening digital breast tomosynthesis was performed. The images were evaluated with computer-aided detection. COMPARISON:  Previous exam(s). ACR Breast Density Category b: There  are scattered areas of fibroglandular density. FINDINGS: In the left breast, a possible asymmetry warrants further evaluation. In the right breast, no findings suspicious for malignancy. IMPRESSION: Further evaluation is suggested for possible asymmetry in the left breast. RECOMMENDATION: Diagnostic mammogram and possibly ultrasound of the left breast. (Code:[id number]) The patient will be contacted regarding the findings, and additional imaging will be scheduled. BI-RADS CATEGORY  0: Incomplete. Need additional imaging evaluation and/or prior mammograms for comparison. Electronically Signed   By: Margarette Canada M.D.   On: 05/23/2021 16:10   MM CLIP PLACEMENT LEFT  Result Date: 06/09/2021 CLINICAL DATA:  Evaluate post biopsy marker clip placement following ultrasound-guided core needle biopsy of a small left breast mass. EXAM: 3D DIAGNOSTIC LEFT MAMMOGRAM POST ULTRASOUND BIOPSY COMPARISON:  Previous exam(s). FINDINGS: 3D Mammographic images were obtained following ultrasound  guided biopsy of a small left breast mass. The biopsy marking clip lies in the region of the small mass, now obscured by post biopsy hemorrhage. Clip appears to be anterior to the mass by approximately 1.4 cm. IMPRESSION: Ribbon shaped biopsy clip is likely anterior to the mammographic mass suspected to be approximately 1.4 cm anterior. There is post biopsy change, however, throughout the area of the mammographic mass. Final Assessment: Post Procedure Mammograms for Marker Placement Electronically Signed   By: Lajean Manes M.D.   On: 06/09/2021 14:00  [place] LT BREAST BX W LOC DEV 1ST LESION IMG BX SPEC US GUIDE  Addendum Date: 06/11/2021   ADDENDUM REPORT: 06/11/2021 08:43 ADDENDUM: Pathology revealed GRADE II INVASIVE MAMMARY CARCINOMA of the LEFT breast, upper inner quadrant at 10 o'clock, (ribbon clip). This was found to be concordant by Dr. Lajean Manes. Pathology results were discussed with the patient by telephone. The patient reported  doing well after the biopsy with tenderness and bruising at the site. NO additional bleeding note per patient. Post biopsy instructions and care were reviewed and questions were answered. The patient was encouraged to call [facility] for any additional concerns. My direct phone number was provided. Surgical consultation has been arranged with Dr. Nedra Hai at [facility], per patient request, on Jun 13, 2021. Pathology results reported by Terie Purser, RN on 06/11/2021. Electronically Signed   By: Lajean Manes M.D.   On: 06/11/2021 08:43   Result Date: 06/11/2021 CLINICAL DATA:  Patient presents for ultrasound-guided core needle biopsy of a small left breast mass. EXAM: ULTRASOUND GUIDED LEFT BREAST CORE NEEDLE BIOPSY COMPARISON:  None Available. PROCEDURE: I met with the patient and we discussed the procedure of ultrasound-guided biopsy, including benefits and alternatives. We discussed the high likelihood of a successful procedure. We discussed the risks of the procedure, including infection, bleeding, tissue injury, clip migration, and inadequate sampling. Informed written consent was given. The usual time-out protocol was performed immediately prior to the procedure. Lesion quadrant: Upper inner quadrant Using sterile technique and 1% Lidocaine as local anesthetic, under direct ultrasound visualization, a 14 gauge spring-loaded device was used to perform biopsy of the subtle hypoechoic 6 mm mass at 10 o'clock using an inferior approach. At the conclusion of the procedure ribbon shaped tissue marker clip was deployed into the biopsy cavity. Follow up 2 view mammogram was performed and dictated separately. IMPRESSION: Ultrasound guided biopsy of a small left breast mass. No apparent complications. Electronically Signed: By: Lajean Manes M.D. On: 06/09/2021 13:39     IMPRESSION/PLAN: Left Breast Cancer, ER+, PR+, Her2 pending   It was a pleasure meeting the  patient today. We discussed the risks, benefits, and side effects of radiotherapy. I recommend radiotherapy adjuvantly to the left breast to reduce her risk of locoregional recurrence by 2/3.  We discussed that radiation would take approximately 3-4 weeks to complete and that I would give the patient a few weeks to heal following surgery before starting treatment planning.  If chemotherapy were to be given, this would precede radiotherapy. We spoke about acute effects including skin irritation and fatigue as well as much less common late effects including internal organ injury or irritation. We spoke about the latest technology that is used to minimize the risk of late effects for patients undergoing radiotherapy to the breast or chest wall. No guarantees of treatment were given. The patient is enthusiastic about proceeding with treatment.  She [place] in the [place]  area, specifically in [place].  She is interested in receiving radiation close to home.  I gave her the website for [facility] and explained how to search for different medical centers that they staff.  I highly recommend this group of physicians and she will be in good hands getting her treatment from them. She is going to look at her options and let Dr. Ninfa Linden know where she would like a referral postoperatively for radiation therapy.  I wished her the very best.  On date of service, in total, I spent 35 minutes on this encounter. Patient was seen in person.   __________________________________________   Eppie Gibson, MD  This document serves as a record of services personally performed by Eppie Gibson, MD. It was created on her behalf by Roney Mans, a trained medical scribe. The creation of this record is based on the scribe's personal observations and the provider's statements to them. This document has been checked and approved by the attending provider.

## 2021-06-24 ENCOUNTER — Ambulatory Visit: Payer: BC Managed Care – PPO | Attending: Surgery

## 2021-06-24 DIAGNOSIS — Z170 Estrogen receptor positive status [ER+]: Secondary | ICD-10-CM | POA: Diagnosis present

## 2021-06-24 DIAGNOSIS — C50912 Malignant neoplasm of unspecified site of left female breast: Secondary | ICD-10-CM | POA: Diagnosis present

## 2021-06-24 DIAGNOSIS — R293 Abnormal posture: Secondary | ICD-10-CM | POA: Diagnosis present

## 2021-06-24 NOTE — Therapy (Signed)
OUTPATIENT PHYSICAL THERAPY BREAST CANCER BASELINE EVALUATION   Patient Name: Catherine Navarro MRN: [id number] DOB:1955-12-13, 66 y.o., female Today's Date: 06/24/2021    Past Medical History:  Diagnosis Date   Abdominal pain 11/27/2010   Accessory skin tags 01/25/2013   Allergy    Anemia    past hx   Anisocoria    right eye   Cancer ([place])    skin cancer removed from nose    Colon polyps 12/22/2010   Tubular Adenoma and Hyperplastic Polyp   DDD (degenerative disc disease), cervical    Elevated LFTs 11/27/2010   Family history of colon cancer    Fatty infiltration of liver    Gastritis    GERD (gastroesophageal reflux disease)    Hiatal hernia    HTN (hypertension)    Hypothyroidism    with nodules   IBS (irritable bowel syndrome)    Muscle spasm of back 06/04/2015   Osteoarthritis    Pancreatitis    Sinusitis    Past Surgical History:  Procedure Laterality Date   ANAL FISTULECTOMY     CHOLECYSTECTOMY     COLONOSCOPY     COLONOSCOPY W/ BIOPSIES     ESOPHAGOGASTRODUODENOSCOPY     EYE SURGERY     PATELLA RECONSTRUCTION     pins   POLYPECTOMY     REPLACEMENT TOTAL KNEE Right 12/2017   TUBAL LIGATION     UPPER GASTROINTESTINAL ENDOSCOPY     Patient Active Problem List   Diagnosis Date Noted   Malignant neoplasm of upper-outer quadrant of left female breast ([place]) 06/20/2021   Screening for colorectal cancer 02/07/2019   Encounter for gynecological examination with Papanicolaou smear of cervix 02/07/2019   Enlarged thyroid 02/07/2019   Hyperglycemia 12/29/2018   H/O hypokalemia 12/29/2018   Pain in right knee 07/21/2017   Aortic atherosclerosis ([place]) 01/04/2017   Degenerative disc disease, lumbar 01/04/2017   Multinodular goiter 06/17/2016   Muscle spasm of back 06/04/2015   Cutaneous skin tags 04/12/2014   BMI 36.0-36.9,adult 08/01/2013   Anisocoria 07/26/2012   Family history of malignant neoplasm of gastrointestinal tract 12/22/2010   Diverticulosis  of colon (without mention of hemorrhage) 12/22/2010   Benign neoplasm of colon 12/22/2010   Fatty liver 11/27/2010   Costochondral chest pain 11/27/2010   GERD (gastroesophageal reflux disease) 91/66/0600   HELICOBACTER PYLORI GASTRITIS, HX OF 05/02/2009   ABDOMINAL PAIN-RUQ 07/12/2007   Essential hypertension 03/23/2007   Allergic rhinitis 03/23/2007   IBS 03/23/2007   Osteoarthritis of right knee 03/23/2007   H/O non anemic vitamin B12 deficiency 03/23/2007   History of acute pancreatitis 03/23/2007   GASTRITIS 12/21/2005   HIATAL HERNIA 12/21/2005    PCP: Paticia Stack, MD  REFERRING PROVIDER: Coralie Keens MD  REFERRING DIAG: Left Breast Cancer  THERAPY DIAG:  Malignant neoplasm of left breast in female, estrogen receptor positive, unspecified site of breast ([place])  Abnormal posture  Rationale for Evaluation and Treatment Rehabilitation  ONSET DATE: 06/11/2021   SUBJECTIVE  SUBJECTIVE STATEMENT: Patient reports she is here today to be seen by her medical team for her newly diagnosed left breast cancer.   PERTINENT HISTORY:  Patient was diagnosed on 06/11/2021 with left Breast Cancer. It measures .6 cm and is located in the 10:00 position of the left breast . It is ER+, PR+ with a Ki67 of 15%. HER 2 is undetermined presently. She is pending left lumpectomy with SLNB on 07/03/2021  PATIENT GOALS   reduce lymphedema risk and learn post op HEP.   PAIN:  Are you having pain? No   PRECAUTIONS: Active CA None and Other: Right TKA  HAND DOMINANCE: left  WEIGHT BEARING RESTRICTIONS No  FALLS:  Has patient fallen in last 6 months? Yes. Number of falls slipped on Sunday, no injuries  LIVING ENVIRONMENT: Patient lives with: husband Lives in: House/apartment Has following equipment  at home: Single point cane, Environmental consultant - 2 wheeled, Electronics engineer, and Grab bars  OCCUPATION: retired  Psychologist, clinical: camping, working with ducks, Sales promotion account executive, gardening  PRIOR LEVEL OF FUNCTION: Independent   OBJECTIVE  COGNITION:  Overall cognitive status: Within functional limits for tasks assessed    POSTURE:  Forward head and rounded shoulders posture  UPPER EXTREMITY AROM/PROM:  A/PROM RIGHT   eval   Shoulder extension 49  Shoulder flexion 170  Shoulder abduction 179  Shoulder internal rotation 65  Shoulder external rotation 96    (Blank rows = not tested)  A/PROM LEFT   eval  Shoulder extension 59  Shoulder flexion 165  Shoulder abduction 170  Shoulder internal rotation 70  Shoulder external rotation 107    (Blank rows = not tested)   CERVICAL AROM: All within functional limits:     UPPER EXTREMITY STRENGTH: WNL   LYMPHEDEMA ASSESSMENTS:   LANDMARK RIGHT   eval  10 cm proximal to olecranon process 38.7  Olecranon process 31.6  10 cm proximal to ulnar styloid process 27.5  Just proximal to ulnar styloid process 18.7  Across hand at thumb web space 20.6  At base of 2nd digit 6.95  (Blank rows = not tested)  LANDMARK LEFT   eval  10 cm proximal to olecranon process 37.5  Olecranon process 31.1  10 cm proximal to ulnar styloid process 26.8  Just proximal to ulnar styloid process 19.2  Across hand at thumb web space 20.8  At base of 2nd digit 7.1  (Blank rows = not tested)   L-DEX LYMPHEDEMA SCREENING:  The patient was assessed using the L-Dex machine today to produce a lymphedema index baseline score. The patient will be reassessed on a regular basis (typically every 3 months) to obtain new L-Dex scores. If the score is > 6.5 points away from his/her baseline score indicating onset of subclinical lymphedema, it will be recommended to wear a compression garment for 4 weeks, 12 hours per day and then be reassessed. If the score continues to be > 6.5 points  from baseline at reassessment, we will initiate lymphedema treatment. Assessing in this manner has a 95% rate of preventing clinically significant lymphedema.     QUICK DASH SURVEY: 0%   PATIENT EDUCATION:  Education details: Lymphedema risk reduction and post op shoulder/posture HEP, compression bra, ABC class, SOZO screens Person educated: Patient Education method: Explanation, Demonstration, Handout Education comprehension: Patient verbalized understanding and returned demonstration   HOME EXERCISE PROGRAM: Patient was instructed today in a home exercise program today for post op shoulder range of motion. These included active assist shoulder flexion in sitting,  scapular retraction, wall walking with shoulder abduction, and hands behind head external rotation.  She was encouraged to do these twice a day, holding 3 seconds and repeating 5 times when permitted by her physician.   ASSESSMENT:  CLINICAL IMPRESSION: Pts.multidisciplinary medical team met prior to her assessments to determine a recommended treatment plan. She is planning to have a left lumpectomy with SLNB on 07/03/2021. She will benefit from a post op PT reassessment to determine needs and from L-Dex screens every 3 months for 2 years to detect subclinical lymphedema.  Pt will benefit from skilled therapeutic intervention to improve on the following deficits: Decreased knowledge of precautions, impaired UE functional use, pain, decreased ROM, postural dysfunction.   PT treatment/interventions: ADL/self-care home management, pt/family education, therapeutic exercise  REHAB POTENTIAL: Excellent  CLINICAL DECISION MAKING: Stable/uncomplicated  EVALUATION COMPLEXITY: Low   GOALS: Goals reviewed with patient? YES  LONG TERM GOALS: (STG=LTG)    Name Target Date Goal status  1 Pt will be able to verbalize understanding of pertinent lymphedema risk reduction practices relevant to her dx specifically related to skin care.   Baseline:  No knowledge 06/24/2021 Achieved at eval  2 Pt will be able to return demo and/or verbalize understanding of the post op HEP related to regaining shoulder ROM. Baseline:  No knowledge 06/24/2021 Achieved at eval  3 Pt will be able to verbalize understanding of the importance of attending the post op After Breast CA Class for further lymphedema risk reduction education and therapeutic exercise.  Baseline:  No knowledge 06/24/2021 Achieved at eval  4 Pt will demo she has regained full shoulder ROM and function post operatively compared to baselines.  Baseline: See objective measurements taken today. 08/05/2021 INITIAL     PLAN: PT FREQUENCY/DURATION: EVAL and 1 follow up appointment.   PLAN FOR NEXT SESSION: will reassess 3-4 weeks post op to determine needs.   Patient will follow up at outpatient cancer rehab 3-4 weeks following surgery.  If the patient requires physical therapy at that time, a specific plan will be dictated and sent to the referring physician for approval. The patient was educated today on appropriate basic range of motion exercises to begin post operatively and the importance of attending the After Breast Cancer class following surgery.  Patient was educated today on lymphedema risk reduction practices as it pertains to recommendations that will benefit the patient immediately following surgery.  She verbalized good understanding.    Physical Therapy Information for After Breast Cancer Surgery/Treatment:  Lymphedema is a swelling condition that you may be at risk for in your arm if you have lymph nodes removed from the armpit area.  After a sentinel node biopsy, the risk is approximately 5-9% and is higher after an axillary node dissection.  There is treatment available for this condition and it is not life-threatening.  Contact your physician or physical therapist with concerns. You may begin the 4 shoulder/posture exercises (see additional sheet) when permitted by  your physician (typically a week after surgery).  If you have drains, you may need to wait until those are removed before beginning range of motion exercises.  A general recommendation is to not lift your arms above shoulder height until drains are removed.  These exercises should be done to your tolerance and gently.  This is not a "no pain/no gain" type of recovery so listen to your body and stretch into the range of motion that you can tolerate, stopping if you have pain.  If you are  having immediate reconstruction, ask your plastic surgeon about doing exercises as he or she may want you to wait. We encourage you to attend the free one time ABC (After Breast Cancer) class offered by [facility].  You will learn information related to lymphedema risk, prevention and treatment and additional exercises to regain mobility following surgery.  You can call [phone] for more information.  This is offered the 1st and 3rd Monday of each month.  You only attend the class one time. While undergoing any medical procedure or treatment, try to avoid blood pressure being taken or needle sticks from occurring on the arm on the side of cancer.   This recommendation begins after surgery and continues for the rest of your life.  This may help reduce your risk of getting lymphedema (swelling in your arm). An excellent resource for those seeking information on lymphedema is the National Lymphedema Network's web site. It can be accessed at [facility]org If you notice swelling in your hand, arm or breast at any time following surgery (even if it is many years from now), please contact your doctor or physical therapist to discuss this.  Lymphedema can be treated at any time but it is easier for you if it is treated early on.  If you feel like your shoulder motion is not returning to normal in a reasonable amount of time, please contact your surgeon or physical therapist.  [facility] [phone]. [address], [place] [address]  ABC CLASS After Breast Cancer Class  After Breast Cancer Class is a specially designed exercise class to assist you in a safe recover after having breast cancer surgery.  In this class you will learn how to get back to full function whether your drains were just removed or if you had surgery a month ago.  This one-time class is held the 1st and 3rd Monday of every month from 11:00 a.m. until 12:00 noon virtually.  This class is FREE and space is limited. For more information or to register for the next available class, call [phone].  Class Goals  Understand specific stretches to improve the flexibility of you chest and shoulder. Learn ways to safely strengthen your upper body and improve your posture. Understand the warning signs of infection and why you may be at risk for an arm infection. Learn about Lymphedema and prevention.  ** You do not attend this class until after surgery.  Drains must be removed to participate  Patient was instructed today in a home exercise program today for post op shoulder range of motion. These included active assist shoulder flexion in sitting, scapular retraction, wall walking with shoulder abduction, and hands behind head external rotation.  She was encouraged to do these twice a day, holding 3 seconds and repeating 5 times when permitted by her physician.    Claris Pong, PT 06/24/2021, 12:56 PM

## 2021-06-25 ENCOUNTER — Encounter (HOSPITAL_BASED_OUTPATIENT_CLINIC_OR_DEPARTMENT_OTHER)
Admission: RE | Admit: 2021-06-25 | Discharge: 2021-06-25 | Disposition: A | Discharge status: Home / Self Care | Payer: BC Managed Care – PPO | Source: Ambulatory Visit | Attending: Surgery | Admitting: Surgery

## 2021-06-25 ENCOUNTER — Inpatient Hospital Stay: Payer: BC Managed Care – PPO | Attending: Hematology and Oncology | Admitting: Hematology and Oncology

## 2021-06-25 ENCOUNTER — Telehealth: Payer: Self-pay | Admitting: Radiation Oncology

## 2021-06-25 ENCOUNTER — Encounter (HOSPITAL_BASED_OUTPATIENT_CLINIC_OR_DEPARTMENT_OTHER): Payer: Self-pay | Admitting: Surgery

## 2021-06-25 ENCOUNTER — Other Ambulatory Visit: Payer: Self-pay

## 2021-06-25 ENCOUNTER — Inpatient Hospital Stay (HOSPITAL_BASED_OUTPATIENT_CLINIC_OR_DEPARTMENT_OTHER): Payer: BC Managed Care – PPO | Admitting: Genetic Counselor

## 2021-06-25 ENCOUNTER — Other Ambulatory Visit: Payer: Self-pay | Admitting: Genetic Counselor

## 2021-06-25 ENCOUNTER — Encounter: Payer: Self-pay | Admitting: Genetic Counselor

## 2021-06-25 ENCOUNTER — Inpatient Hospital Stay: Payer: BC Managed Care – PPO

## 2021-06-25 DIAGNOSIS — Z79899 Other long term (current) drug therapy: Secondary | ICD-10-CM | POA: Insufficient documentation

## 2021-06-25 DIAGNOSIS — I10 Essential (primary) hypertension: Secondary | ICD-10-CM | POA: Insufficient documentation

## 2021-06-25 DIAGNOSIS — Z170 Estrogen receptor positive status [ER+]: Secondary | ICD-10-CM | POA: Insufficient documentation

## 2021-06-25 DIAGNOSIS — C50412 Malignant neoplasm of upper-outer quadrant of left female breast: Secondary | ICD-10-CM | POA: Insufficient documentation

## 2021-06-25 DIAGNOSIS — Z803 Family history of malignant neoplasm of breast: Secondary | ICD-10-CM | POA: Diagnosis not present

## 2021-06-25 DIAGNOSIS — Z800 Family history of malignant neoplasm of digestive organs: Secondary | ICD-10-CM | POA: Diagnosis not present

## 2021-06-25 DIAGNOSIS — Z01818 Encounter for other preprocedural examination: Secondary | ICD-10-CM | POA: Insufficient documentation

## 2021-06-25 LAB — BASIC METABOLIC PANEL
Anion gap: 7 (ref 5–15)
BUN: 11 mg/dL (ref 8–23)
CO2: 27 mmol/L (ref 22–32)
Calcium: 9.8 mg/dL (ref 8.9–10.3)
Chloride: 105 mmol/L (ref 98–111)
Creatinine, Ser: 0.78 mg/dL (ref 0.44–1.00)
GFR, Estimated: >60 mL/min (ref >60)
Glucose, Bld: 119 mg/dL — ABNORMAL HIGH (ref 70–99)
Potassium: 3.6 mmol/L (ref 3.5–5.1)
Sodium: 139 mmol/L (ref 135–145)

## 2021-06-25 LAB — GENETIC SCREENING ORDER

## 2021-06-25 MED ORDER — ENSURE PRE-SURGERY PO LIQD: 296.0000 mL | Freq: Once | ORAL | Status: DC

## 2021-06-25 NOTE — Progress Notes (Signed)
Rule NOTE  Patient Care Team: Doree Albee, MD (Inactive) as PCP - General (Internal Medicine) Jonnie Kind, MD (Inactive) as Consulting Physician (Obstetrics and Gynecology) Barbaraann Cao, OD as Referring Physician (Optometry) Mauro Kaufmann, RN as Oncology Nurse Navigator Rockwell Germany, RN as Oncology Nurse Navigator  CHIEF COMPLAINTS/PURPOSE OF CONSULTATION:  Newly diagnosed breast cancer  HISTORY OF PRESENTING ILLNESS:  Catherine Navarro 66 y.o. female is here because of recent diagnosis of left breast cancer.  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  She was seen by Dr. Ninfa Linden who recommended lumpectomy.  She also saw Dr. Isidore Moos with radiation oncology.  She lives 2 hours away from here and wishes to undergo radiation closer to her home.  She is fine to proceeding with surgery here.  I reviewed her records extensively and collaborated the history with the patient.  SUMMARY OF ONCOLOGIC HISTORY: Oncology History  Malignant neoplasm of upper-outer quadrant of left female breast ([place])  06/20/2021 Initial Diagnosis   Malignant neoplasm of upper-outer quadrant of left female breast ([place])   06/25/2021 Cancer Staging   Staging form: Breast, AJCC 8th Edition - Clinical: Stage IA (cT1b, cN0, cM0, G2, ER+, PR+, HER2: Equivocal) - Signed by Nicholas Lose, MD on 06/25/2021 Stage prefix: Initial diagnosis Histologic grading system: 3 grade system       MEDICAL HISTORY:  Past Medical History:  Diagnosis Date   Abdominal pain 11/27/2010   Accessory skin tags 01/25/2013   Allergy    Anemia    past hx   Anisocoria    right eye   Cancer ([place])    skin cancer removed from nose    Colon polyps 12/22/2010   Tubular Adenoma and Hyperplastic Polyp   DDD (degenerative disc  disease), cervical    Elevated LFTs 11/27/2010   Family history of colon cancer    Fatty infiltration of liver    Gastritis    GERD (gastroesophageal reflux disease)    Hiatal hernia    HTN (hypertension)    Hypothyroidism    with nodules   IBS (irritable bowel syndrome)    Muscle spasm of back 06/04/2015   Osteoarthritis    Pancreatitis    Sinusitis     SURGICAL HISTORY: Past Surgical History:  Procedure Laterality Date   ANAL FISTULECTOMY     CHOLECYSTECTOMY     COLONOSCOPY     COLONOSCOPY W/ BIOPSIES     ESOPHAGOGASTRODUODENOSCOPY     EYE SURGERY     PATELLA RECONSTRUCTION     pins   POLYPECTOMY     REPLACEMENT TOTAL KNEE Right 12/2017   TUBAL LIGATION     UPPER GASTROINTESTINAL ENDOSCOPY      SOCIAL HISTORY: Social History   Socioeconomic History   Marital status: Married    Spouse name: BF: Laddie Aquas   Number of children: 3   Years of education: 12+   Highest education level: Not on file  Occupational History   Occupation: Education officer, environmental: OTHER  Tobacco Use   Smoking status: Former    Types: Cigarettes    Quit date: 01/26/1998    Years since quitting: 23.4    Passive exposure: Past   Smokeless tobacco: Never  Vaping Use   Vaping Use: Never used  Substance and Sexual Activity  Alcohol use: No   Drug use: No   Sexual activity: Not Currently    Birth control/protection: Post-menopausal, Surgical    Comment: tubal  Other Topics Concern   Not on file  Social History Narrative   Lives alone.Her ex-husband died of heart attack at age 18. 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   One son lives next door, one in [place], [place] and another in [place].   Social Determinants of Health   Financial Resource Strain: Not on file  Food Insecurity: Not on file  Transportation Needs: Not on file  Physical Activity: Not on file  Stress: Not on file  Social Connections: Not on file   Intimate Partner Violence: Not on file    FAMILY HISTORY: Family History  Problem Relation Age of Onset   Breast cancer Maternal Aunt 18   Diabetes Mother    Irritable bowel syndrome Mother    Stroke Mother    Heart attack Paternal Grandfather    COPD Father        emphysema   Cancer Father    Alzheimer's disease Paternal Grandmother    Alzheimer's disease Maternal Aunt    Colon cancer Sister    Liver cancer Sister    Cancer Brother 80       colon-colorectal colon cancer   Colon cancer Brother    Rectal cancer Brother    Hyperlipidemia Sister    Hypertension Sister    Colon polyps Sister    Kidney cancer Sister    Diabetes Maternal Grandmother    Hyperlipidemia Brother        triglycerides   Hypertension Brother    Colon polyps Brother    Diabetes Sister    Liver disease Sister        fatty liver, cirrhosis   Colon polyps Sister    Kartagener's syndrome Sister    Heart attack Son 19       rare genetic condition   Heart disease Neg Hx    Esophageal cancer Neg Hx    Stomach cancer Neg Hx     ALLERGIES:  is allergic to amoxicillin and codeine.  MEDICATIONS:  Current Outpatient Medications  Medication Sig Dispense Refill   Ascorbic Acid (VITAMIN C) 100 MG tablet Take 100 mg by mouth daily.     aspirin 81 MG tablet Take 81 mg by mouth daily.     Calcium Carbonate-Vitamin D (CALCIUM-D) 600-400 MG-UNIT TABS Take by mouth every other day.      clindamycin (CLEOCIN) 300 MG capsule clindamycin HCl 300 mg capsule  TAKE 3 CAPSULES 1 HOUR PRIOR TO DENTAL WORK     fish oil-omega-3 fatty acids 1000 MG capsule Take 1 g by mouth every other day.     fluticasone (FLONASE) 50 MCG/ACT nasal spray Place 2 sprays into both nostrils daily. (Patient taking differently: Place 2 sprays into both nostrils as needed.) 48 g 3   Garlic 384 MG TABS Take by mouth. 1000 mg daily     loratadine (CLARITIN REDITABS) 10 MG dissolvable tablet Take 10 mg by mouth daily.     meloxicam (MOBIC) 15  MG tablet Take 1 tablet (15 mg total) by mouth daily as needed for pain. Take 1 tablet by mouth  daily 90 tablet 1   Multiple Vitamin (MULTIVITAMIN PO) Take by mouth 1 day or 1 dose.     pantoprazole (PROTONIX) 40 MG tablet Take 1 tablet by mouth two  times daily 180  tablet 3   potassium chloride SA (KLOR-CON) 20 MEQ tablet Take 1 tablet (20 mEq total) by mouth daily. 90 tablet 1   propranolol ER (INDERAL LA) 80 MG 24 hr capsule TAKE 1 CAPSULE BY MOUTH EVERY DAY (Patient taking differently: at bedtime. TAKE 1 CAPSULE BY MOUTH EVERY DAY) 90 capsule 1   pyridOXINE (VITAMIN B-6) 100 MG tablet Take 100 mg by mouth daily.      valsartan-hydrochlorothiazide (DIOVAN-HCT) 80-12.5 MG tablet Take 1 tablet by mouth daily. 90 tablet 1   vitamin B-12 (CYANOCOBALAMIN) 1000 MCG tablet Take 1,000 mcg by mouth daily.     Zinc 100 MG TABS Take by mouth.     Current Facility-Administered Medications  Medication Dose Route Frequency Provider Last Rate Last Admin   0.9 %  sodium chloride infusion  500 mL Intravenous Continuous Pyrtle, Lajuan Lines, MD       Facility-Administered Medications Ordered in Other Visits  Medication Dose Route Frequency Provider Last Rate Last Admin   [START ON 06/26/2021] feeding supplement (ENSURE PRE-SURGERY) liquid 296 mL  296 mL Oral Once Coralie Keens, MD        REVIEW OF SYSTEMS:   Constitutional: Denies fevers, chills or abnormal night sweats   All other systems were reviewed with the patient and are negative.  PHYSICAL EXAMINATION: ECOG PERFORMANCE STATUS: 0 - Asymptomatic  Vitals:   06/25/21 1443  BP: (!) 163/82  Navarro: 69  Resp: 18  Temp: 97.8 F (36.6 C)  SpO2: 98%   Filed Weights   06/25/21 1443  Weight: 245 lb 4.8 oz (111.3 kg)   LABORATORY DATA:  I have reviewed the data as listed Lab Results  Component Value Date   WBC 5.5 12/29/2019   HGB 12.8 12/29/2019   HCT 38.6 12/29/2019   MCV 86.4 12/29/2019   PLT 211 12/29/2019   Lab Results  Component Value  Date   NA 143 12/29/2019   K 3.8 12/29/2019   CL 105 12/29/2019   CO2 30 12/29/2019    RADIOGRAPHIC STUDIES: I have personally reviewed the radiological reports and agreed with the findings in the report.  ASSESSMENT AND PLAN:  Malignant neoplasm of upper-outer quadrant of left female breast ([place]) Screening mammogram detected asymmetry left breast, ultrasound revealed 6 mm irregular mass 10 o'clock position: Biopsy: Invasive mammary cancer grade 2, ER 100%, PR 100%, Ki-67 15%, HER2 equivocal 2+ by IHC, FISH negative  Pathology and radiology counseling: Discussed with the patient, the details of pathology including the type of breast cancer,the clinical staging, the significance of ER, PR and HER-2/neu receptors and the implications for treatment. After reviewing the pathology in detail, we proceeded to discuss the different treatment options between surgery, radiation, chemotherapy, antiestrogen therapies.  Recommendation: 1.  Breast conserving surgery 2. Oncotype Dx testing to determine if she would benefit from chemotherapy 3.  Adjuvant radiation (she lives 2 hours away and wishes to receive radiation locally closer to her home) 4.  Followed by adjuvant antiestrogen therapy with letrozole 2.5 mg daily x5 years  Return to clinic after surgery to discuss the pathology report and come up with the adjuvant treatment plan. Since she lives 2 hours away we will do a MyChart virtual visit to discuss the pathology report and determine if Oncotype DX needs to be sent.  All questions were answered. The patient knows to call the clinic with any problems, questions or concerns.    Harriette Ohara, MD 06/25/21

## 2021-06-25 NOTE — Assessment & Plan Note (Signed)
Screening mammogram detected asymmetry left breast, ultrasound revealed 6 mm irregular mass 10 o'clock position: Biopsy: Invasive mammary cancer grade 2, ER 100%, PR 100%, Ki-67 15%, HER2 equivocal 2+ by IHC, FISH pending  Pathology and radiology counseling: Discussed with the patient, the details of pathology including the type of breast cancer,the clinical staging, the significance of ER, PR and HER-2/neu receptors and the implications for treatment. After reviewing the pathology in detail, we proceeded to discuss the different treatment options between surgery, radiation, chemotherapy, antiestrogen therapies.  Recommendation: 1.  Breast conserving surgery 2. adjuvant therapy based upon HER2 as well as the tumor size.  If HER2 is negative we will consider doing Oncotype. 3.  Adjuvant radiation 4.  Followed by adjuvant antiestrogen therapy  Return to clinic after surgery to discuss the pathology report and come up with the adjuvant treatment plan.

## 2021-06-25 NOTE — Progress Notes (Signed)

## 2021-06-25 NOTE — Progress Notes (Signed)
REFERRING PROVIDER: Coralie Keens, MD [name],  [place] [address]  PRIMARY PROVIDER:  Doree Albee, MD (Inactive)  PRIMARY REASON FOR VISIT:  1. Family history of colon cancer   2. Family history of breast cancer   3. Malignant neoplasm of upper-outer quadrant of left breast in female, estrogen receptor positive (Colon)      HISTORY OF PRESENT ILLNESS:   Catherine Navarro, a 66 y.o. female, was seen for a [facility] cancer genetics consultation at the request of Catherine Navarro due to a personal and family history of breast cancer and family history of colon cancer.  Catherine Navarro presents to clinic today to discuss the possibility of a hereditary predisposition to cancer, genetic testing, and to further clarify her future cancer risks, as well as potential cancer risks for family members.   In May 2023, at the age of 73, Catherine Navarro was diagnosed with cancer of the left breast.      CANCER HISTORY:  Oncology History  Malignant neoplasm of upper-outer quadrant of left female breast ([place])  06/20/2021 Initial Diagnosis   Malignant neoplasm of upper-outer quadrant of left female breast ([place])   06/25/2021 Cancer Staging   Staging form: Breast, AJCC 8th Edition - Clinical: Stage IA (cT1b, cN0, cM0, G2, ER+, PR+, HER2: Equivocal) - Signed by Catherine Lose, MD on 06/25/2021 Stage prefix: Initial diagnosis Histologic grading system: 3 grade system       RISK FACTORS:  Menarche was at age 4.  First live birth at age 62.  OCP use for approximately 0 years.  Ovaries intact: yes.  Hysterectomy: no.  Menopausal status: postmenopausal.  HRT use: 5 years. Colonoscopy: yes;  ~7 polyps . Mammogram within the last year: yes. Number of breast biopsies: 1. Up to date with pelvic exams: yes. Any excessive radiation exposure in the past: no  Past Medical History:  Diagnosis Date   Abdominal pain 11/27/2010   Accessory skin tags 01/25/2013   Allergy    Anemia    past hx    Anisocoria    right eye   Cancer ([place])    skin cancer removed from nose    Colon polyps 12/22/2010   Tubular Adenoma and Hyperplastic Polyp   DDD (degenerative disc disease), cervical    Elevated LFTs 11/27/2010   Family history of breast cancer    Family history of colon cancer    Family history of colon cancer    Fatty infiltration of liver    Gastritis    GERD (gastroesophageal reflux disease)    Hiatal hernia    HTN (hypertension)    Hypothyroidism    with nodules   IBS (irritable bowel syndrome)    Muscle spasm of back 06/04/2015   Osteoarthritis    Pancreatitis    Sinusitis     Past Surgical History:  Procedure Laterality Date   ANAL FISTULECTOMY     CHOLECYSTECTOMY     COLONOSCOPY     COLONOSCOPY W/ BIOPSIES     ESOPHAGOGASTRODUODENOSCOPY     EYE SURGERY     PATELLA RECONSTRUCTION     pins   POLYPECTOMY     REPLACEMENT TOTAL KNEE Right 12/2017   TUBAL LIGATION     UPPER GASTROINTESTINAL ENDOSCOPY      Social History   Socioeconomic History   Marital status: Married    Spouse name: BF: Catherine Navarro   Number of children: 3   Years of education: 12+   Highest education  level: Not on file  Occupational History   Occupation: IT Support    Employer: OTHER  Tobacco Use   Smoking status: Former    Types: Cigarettes    Quit date: 01/26/1998    Years since quitting: 23.4    Passive exposure: Past   Smokeless tobacco: Never  Vaping Use   Vaping Use: Never used  Substance and Sexual Activity   Alcohol use: No   Drug use: No   Sexual activity: Not Currently    Birth control/protection: Post-menopausal, Surgical    Comment: tubal  Other Topics Concern   Not on file  Social History Narrative   Lives alone.Her ex-husband died of heart attack at age 62. 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   One son lives next door, one in [place], [place] and another in [place].   Social Determinants of  Health   Financial Resource Strain: Not on file  Food Insecurity: Not on file  Transportation Needs: Not on file  Physical Activity: Not on file  Stress: Not on file  Social Connections: Not on file     FAMILY HISTORY:  We obtained a detailed, 4-generation family history.  Significant diagnoses are listed below: Family History  Problem Relation Age of Onset   Diabetes Mother    Irritable bowel syndrome Mother    Stroke Mother    COPD Father        emphysema   Cancer Father    Colon cancer Sister    Liver cancer Sister    Non-Hodgkin's lymphoma Sister    Hyperlipidemia Sister    Hypertension Sister    Colon polyps Sister    Diabetes Sister    Liver disease Sister        fatty liver, cirrhosis   Colon polyps Sister    Kartagener's syndrome Sister    Cancer Brother 64       colon-colorectal colon cancer   Colon cancer Brother    Rectal cancer Brother    Hyperlipidemia Brother        triglycerides   Hypertension Brother    Colon polyps Brother    Breast cancer Maternal Aunt 6   Alzheimer's disease Maternal Aunt    Non-Hodgkin's lymphoma Paternal Uncle    Diabetes Maternal Grandmother    Alzheimer's disease Paternal Grandmother    Heart attack Paternal Grandfather    Heart attack Son 88       rare genetic condition   Non-Hodgkin's lymphoma Niece    Heart disease Neg Hx    Esophageal cancer Neg Hx    Stomach cancer Neg Hx      The patient has three sons.  One son tested positive for a hereditary cardiology syndrome (patient unsure what it is).  Her other sons are negative for this condition (inherited from their father who died of a heart attack).  She has three sisters and three brothers.  One brother had colon cancer at 11, a sister had colon cancer at 58.  This sister has a daughter with NHL.  Another sister had NHL, and a brother has lung cancer and his daughter died of lung cancer at 79.  The parents are both deceased.  The patient's mother had three brothers  and three sisters. One sister had breast cancer at 53.  Her parents are deceased from non-cancer related issues.  The patient's father had some form of cancer.  He had two brothers, one had NHL. His parents died  of non cancer related issues.  The grandfather had several sisters who may have had GYN cancers.  Catherine Navarro is unaware of previous family history of genetic testing for hereditary cancer risks. Patient's maternal ancestors are of Scot[place]Irish descent, and paternal ancestors are of English descent. There is no reported Ashkenazi Jewish ancestry. There is no known consanguinity.  GENETIC COUNSELING ASSESSMENT: Catherine Navarro is a 66 y.o. female with a personal and family history of cancer which is somewhat suggestive of a hereditary cancer syndrome such as Lynch syndrome and predisposition to cancer given the combination of cancer and young ages of onset. We, therefore, discussed and recommended the following at today's visit.   DISCUSSION: We discussed that, in general, most cancer is not inherited in families, but instead is sporadic or familial. Sporadic cancers occur by chance and typically happen at older ages (>50 years) as this type of cancer is caused by genetic changes acquired during an individual's lifetime. Some families have more cancers than would be expected by chance; however, the ages or types of cancer are not consistent with a known genetic mutation or known genetic mutations have been ruled out. This type of familial cancer is thought to be due to a combination of multiple genetic, environmental, hormonal, and lifestyle factors. While this combination of factors likely increases the risk of cancer, the exact source of this risk is not currently identifiable or testable.  We discussed that 5 - 10% of breast cancer is hereditary, with most cases associated with BRCA mutations.  There are other genes that can be associated with hereditary breast cancer syndromes.  These include ATM, CHEK2  and PALB2.  We discussed that testing is beneficial for several reasons including knowing how to follow individuals after completing their treatment, identifying whether potential treatment options such as PARP inhibitors would be beneficial, and understand if other family members could be at risk for cancer and allow them to undergo genetic testing.   We reviewed the characteristics, features and inheritance patterns of hereditary cancer syndromes. We also discussed genetic testing, including the appropriate family members to test, the process of testing, insurance coverage and turn-around-time for results. We discussed the implications of a negative, positive, carrier and/or variant of uncertain significant result. We recommended Catherine Navarro pursue genetic testing for the CancerNext-Expanded+RNAinsight gene panel.   The CancerNext-Expanded gene panel offered by [facility] and includes sequencing and rearrangement analysis for the following 77 genes: AIP, ALK, APC*, ATM*, AXIN2, BAP1, BARD1, BLM, BMPR1A, BRCA1*, BRCA2*, BRIP1*, CDC73, CDH1*, CDK4, CDKN1B, CDKN2A, CHEK2*, CTNNA1, DICER1, FANCC, FH, FLCN, GALNT12, KIF1B, LZTR1, MAX, MEN1, MET, MLH1*, MSH2*, MSH3, MSH6*, MUTYH*, NBN, NF1*, NF2, NTHL1, PALB2*, PHOX2B, PMS2*, POT1, PRKAR1A, PTCH1, PTEN*, RAD51C*, RAD51D*, RB1, RECQL, RET, SDHA, SDHAF2, SDHB, SDHC, SDHD, SMAD4, SMARCA4, SMARCB1, SMARCE1, STK11, SUFU, TMEM127, TP53*, TSC1, TSC2, VHL and XRCC2 (sequencing and deletion/duplication); EGFR, EGLN1, HOXB13, KIT, MITF, PDGFRA, POLD1, and POLE (sequencing only); EPCAM and GREM1 (deletion/duplication only). DNA and RNA analyses performed for * genes.   Based on Catherine Navarro personal and family history of cancer, she meets medical criteria for genetic testing. Despite that she meets criteria, she may still have an out of pocket cost. We discussed that if her out of pocket cost for testing is over $100, the laboratory will call and confirm whether she wants  to proceed with testing.  If the out of pocket cost of testing is less than $100 she will be billed by the genetic testing laboratory.   PLAN: After  considering the risks, benefits, and limitations, Catherine Navarro provided informed consent to pursue genetic testing and the blood sample was sent to [facility] for analysis of the CancerNext-Expanded+RNAinsight. Results should be available within approximately 2-3 weeks' time, at which point they will be disclosed by telephone to Catherine Navarro, as will any additional recommendations warranted by these results. Catherine Navarro will receive a summary of her genetic counseling visit and a copy of her results once available. This information will also be available in Epic.   Lastly, we encouraged Catherine Navarro to remain in contact with cancer genetics annually so that we can continuously update the family history and inform her of any changes in cancer genetics and testing that may be of benefit for this family.   Catherine Navarro questions were answered to her satisfaction today. Our contact information was provided should additional questions or concerns arise. Thank you for the referral and allowing [place] to share in the care of your patient.   Karen P. Florene Glen, [place], [facility] [place] [facility] Licensed, Insurance risk surveyor [email or web address] phone: [phone]  The patient was seen for a total of 35 minutes in face-to-face genetic counseling.  The patient brought her husband. This patient was discussed with Drs. Magrinat, Lindi Adie and/or Burr Medico who agrees with the above.    _______________________________________________________________________ For Office Staff:  Number of people involved in session: 2 Was an Intern/ student involved with case: no

## 2021-06-25 NOTE — Telephone Encounter (Signed)
5/31 @ 1:38 pm Left voicemail for patient to call our office to be schedule for consult with Dr. Isidore Moos.

## 2021-06-26 ENCOUNTER — Telehealth: Payer: Self-pay | Admitting: *Deleted

## 2021-06-26 ENCOUNTER — Encounter: Payer: Self-pay | Admitting: *Deleted

## 2021-06-26 NOTE — Telephone Encounter (Signed)
Called pt to provide navigation resources and contact information. No answer, left vm requesting return call with question or concerns. Contact information provided.

## 2021-06-26 NOTE — Progress Notes (Signed)
HEMATOLOGY-ONCOLOGY [place] VISIT PROGRESS NOTE  I connected with Catherine Navarro on 07/10/2021 at  8:30 AM EDT by Mychart video conference and verified that I am speaking with the correct person using two identifiers.  I discussed the limitations, risks, security and privacy concerns of performing an evaluation and management service by Webex and the availability of in person appointments.  I also discussed with the patient that there may be a patient responsible charge related to this service. The patient expressed understanding and agreed to proceed.  Patient's Location: Home Physician Location: Clinic  CHIEF COMPLIANT: Discuss pathology report  INTERVAL HISTORY: Catherine Navarro is a 66 y.o. female with above-mentioned history of Left breast cancer. She presents to the clinic today for a My chart follow-up to discuss pathology report. She's healing very well from the recent surgery  Oncology History  Malignant neoplasm of upper-outer quadrant of left female breast ([place])  06/20/2021 Initial Diagnosis   Malignant neoplasm of upper-outer quadrant of left female breast ([place])   06/25/2021 Cancer Staging   Staging form: Breast, AJCC 8th Edition - Clinical: Stage IA (cT1b, cN0, cM0, G2, ER+, PR+, HER2: Equivocal) - Signed by Nicholas Lose, MD on 06/25/2021 Stage prefix: Initial diagnosis Histologic grading system: 3 grade system     REVIEW OF SYSTEMS:   Constitutional: Denies fevers, chills or abnormal weight loss  All other systems were reviewed with the patient and are negative.  Observations/Objective:  There were no vitals filed for this visit. There is no height or weight on file to calculate BMI.  I have reviewed the data as listed    Latest Ref Rng & Units 06/25/2021    2:29 PM 12/29/2019    8:18 AM 06/29/2019    9:50 AM  CMP  Glucose 70 - 99 mg/dL 119  103  111   BUN 8 - 23 mg/dL _0 Creatinine 0.44 - 1.00 mg/dL 0.78  0.71  0.87   Sodium 135 - 145 mmol/L  139  143  143   Potassium 3.5 - 5.1 mmol/L 3.6  3.8  4.1   Chloride 98 - 111 mmol/L 105  105  104   CO2 22 - 32 mmol/L _1 Calcium 8.9 - 10.3 mg/dL 9.8  9.9  10.0   Total Protein 6.1 - 8.1 g/dL  6.5  6.9   Total Bilirubin 0.2 - 1.2 mg/dL  0.5  0.5   Alkaline Phos 48 - 121 IU/L   103   AST 10 - 35 U/L  35  30   ALT 6 - 29 U/L  40  38     Lab Results  Component Value Date   WBC 5.5 12/29/2019   HGB 12.8 12/29/2019   HCT 38.6 12/29/2019   MCV 86.4 12/29/2019   PLT 211 12/29/2019   NEUTROABS 2,706 12/29/2019      Assessment Plan:  Malignant neoplasm of upper-outer quadrant of left female breast ([place]) Screening mammogram detected asymmetry left breast, ultrasound revealed 6 mm irregular mass 10 o'clock position: Biopsy: Invasive mammary cancer grade 2, ER 100%, PR 100%, Ki-67 15%, HER2 equivocal 2+ by IHC, FISH negative  07/03/21: Left Lumpectomy Grade 2 IDC, 0/2 LN Neg, ER 100%, PR 100%, Ki-67 15%, HER2 equivocal 2+ by IHC, FISH negative    Pathology counseling: I discussed the final pathology report of the patient provided  a copy of this report. I discussed the margins as well as lymph  node surgeries. We also discussed the final staging along with previously performed ER/PR and HER-2/neu testing.  Recommendation: 1. Oncotype Dx testing to determine if she would benefit from chemotherapy 2.  Adjuvant radiation (she lives 2 hours away and wishes to receive radiation locally closer to her home) at [place]. 3.  Followed by adjuvant antiestrogen therapy with letrozole 2.5 mg daily x5 years  RTC based on oncotype result    I discussed the assessment and treatment plan with the patient. The patient was provided an opportunity to ask questions and all were answered. The patient agreed with the plan and demonstrated an understanding of the instructions. The patient was advised to call back or seek an in-person evaluation if the symptoms worsen or if the condition fails to  improve as anticipated.   I provided 14 minutes of face-to-face Web Ex time during this encounter.    Rulon Eisenmenger, MD 07/10/2021  I Gardiner Coins am scribing for Dr. Lindi Adie  I have reviewed the above documentation for accuracy and completeness, and I agree with the above.

## 2021-06-30 ENCOUNTER — Inpatient Hospital Stay: Payer: BC Managed Care – PPO | Attending: Licensed Clinical Social Worker | Admitting: Licensed Clinical Social Worker

## 2021-06-30 DIAGNOSIS — C50412 Malignant neoplasm of upper-outer quadrant of left female breast: Secondary | ICD-10-CM | POA: Insufficient documentation

## 2021-06-30 NOTE — Progress Notes (Signed)
[facility] Work  Initial Assessment   Catherine Navarro is a 66 y.o. year old female contacted by phone. Clinical Social Work was referred by  new patient protocol  for assessment of psychosocial needs.   SDOH (Social Determinants of Health) assessments performed: Yes SDOH Interventions    Flowsheet Row Most Recent Value  SDOH Interventions   Food Insecurity Interventions Intervention Not Indicated  Financial Strain Interventions Intervention Not Indicated  Housing Interventions Intervention Not Indicated  Transportation Interventions Intervention Not Indicated       SDOH Screenings   Alcohol Screen: Not on file  Depression (PHQ2-9): Not on file  Financial Resource Strain: Low Risk    Difficulty of Paying Living Expenses: Not very hard  Food Insecurity: No Food Insecurity   Worried About Running Out of Food in the Last Year: Never true   Ran Out of Food in the Last Year: Never true  Housing: Low Risk    Last Housing Risk Score: 0  Physical Activity: Not on file  Social Connections: Not on file  Stress: Not on file  Tobacco Use: Medium Risk   Smoking Tobacco Use: Former   Smokeless Tobacco Use: Never   Passive Exposure: Past  Transportation Needs: No Data processing manager (Medical): No   Lack of Transportation (Non-Medical): No     Distress Screen completed: No     View : No data to display.            Family/Social Information:  Housing Arrangement: patient lives with husband . They live about 2 hrs from the cancer center Family members/support persons in your life? Family and Friends Transportation concerns: no  Employment: Retired .  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 yes, a little nervous about getting the IV before surgery, but ready to have treatment Concerns about diagnosis and/or treatment:  general adjustment to diagnosis Patient reported stressors: Adjusting to my illness and Facing my mortality Current coping skills/ strengths: Capable of independent living , Motivation for treatment/growth , and Supportive family/friends     SUMMARY: Current SDOH Barriers:  None at this time  Clinical Social Work Clinical Goal(s):  No clinical social work goals at this time  Interventions: Discussed common feeling and emotions when being diagnosed with cancer, and the importance of support during treatment Informed patient of the support team roles and support services at [facility] Provided [place] contact information and encouraged patient to call with any questions or concerns   Follow Up Plan: Patient will contact CSW with any support or resource needs Patient verbalizes understanding of plan: Yes    Gloriajean Okun E Ashawna Hanback, LCSW

## 2021-07-01 ENCOUNTER — Ambulatory Visit
Admission: RE | Admit: 2021-07-01 | Discharge: 2021-07-01 | Disposition: A | Discharge status: Home / Self Care | Payer: BC Managed Care – PPO | Source: Ambulatory Visit | Attending: Surgery | Admitting: Surgery

## 2021-07-01 DIAGNOSIS — Z853 Personal history of malignant neoplasm of breast: Secondary | ICD-10-CM

## 2021-07-02 NOTE — H&P (Signed)
REFERRING PHYSICIAN: Paticia Stack, MD  PROVIDER: Beverlee Nims, MD  MRN: [id number] DOB: [birth year] DATE OF ENCOUNTER: 06/13/2021 Subjective   Chief Complaint: New Consultation (Breast Cancer)   History of Present Illness: Catherine Navarro is a 66 y.o. female who is seen t as an office consultation for evaluation of New Consultation (Breast Cancer) .   This is a 66 year old female was found on recent screening mammography to have a very small mass in the right breast. She underwent further imaging showing a 6 mm mass at the 10 o'clock position of the left breast. 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 HER2 status is pending. She has had no previous problems regarding her breast. She denies nipple discharge. She does have a family history of breast cancer in her maternal aunt. She is otherwise healthy without complaints.  Review of Systems: A complete review of systems was obtained from the patient. I have reviewed this information and discussed as appropriate with the patient. See HPI as well for other ROS.  ROS   Medical History: Past Medical History:  Diagnosis Date   Arthritis   GERD (gastroesophageal reflux disease)   Hypertension   There is no problem list on file for this patient.  Past Surgical History:  Procedure Laterality Date   CHOLECYSTECTOMY   JOINT REPLACEMENT    Allergies  Allergen Reactions   Codeine Headache and Other (See Comments)  REACTION: headache Other reaction(s): Headache, Other (see comments)   Amoxicillin Rash  rash   Current Outpatient Medications on File Prior to Visit  Medication Sig Dispense Refill   ascorbic acid, vitamin C, (VITAMIN C) 100 MG tablet Take 100 mg by mouth once daily   aspirin 81 MG chewable tablet Take by mouth   cyanocobalamin (VITAMIN B12) 1000 MCG tablet Take by mouth   docosahexaenoic acid-epa 120-180 mg Cap Take by mouth    fluticasone propionate (FLONASE) 50 mcg/actuation nasal spray Place 2 sprays into both nostrils once daily   garlic 093 mg Tab Take by mouth   loratadine (CLARITIN REDITABS) 10 mg dissolvable tablet Take 10 mg by mouth once daily   meloxicam (MOBIC) 15 MG tablet Take 15 mg by mouth once daily   multivitamin capsule Take 1 capsule by mouth once daily   pantoprazole (PROTONIX) 40 MG DR tablet pantoprazole 40 mg tablet,delayed release   potassium chloride (KLOR-CON) 20 MEQ ER tablet Take 1 tablet by mouth once daily   propranoloL (INNOPRAN XL) 80 MG XL capsule propranolol ER 80 mg capsule,24 hr,extended release   pyridoxine, vitamin B6, (B-6) 100 MG tablet Take 100 mg by mouth once daily   valsartan-hydroCHLOROthiazide (DIOVAN-HCT) 80-12.5 mg tablet valsartan 80 mg-hydrochlorothiazide 12.5 mg tablet   No current facility-administered medications on file prior to visit.   Family History  Problem Relation Age of Onset   Stroke Mother   Skin cancer Mother   Obesity Mother   High blood pressure (Hypertension) Mother   Diabetes Mother   Skin cancer Father   Obesity Sister   High blood pressure (Hypertension) Sister   Diabetes Sister   Colon cancer Sister   Skin cancer Brother   Obesity Brother   High blood pressure (Hypertension) Brother   Colon cancer Brother    Social History   Tobacco Use  Smoking Status Former   Types: Cigarettes   Quit date: 2000   Years since quitting: 23.3  Smokeless Tobacco  Never    Social History   Socioeconomic History   Marital status: Married  Tobacco Use   Smoking status: Former  Types: Cigarettes  Quit date: 2000  Years since quitting: 23.3   Smokeless tobacco: Never  Vaping Use   Vaping Use: Never used  Substance and Sexual Activity   Alcohol use: Not Currently   Drug use: Never   Objective:   Vitals:   BP: (!) 170/100  Pulse: 67  Temp: 36.7 C (98.1 F)  SpO2: 98%  Weight: (!) 111.4 kg (245 lb 9.6 oz)  Height: 167.6 cm (_0 )    Body mass index is 39.64 kg/m.  Physical Exam   She appears well on exam  Breasts are examined. The nipple areolar complexes are normal. There is ecchymosis and some bruising at the biopsy site of the left breast.  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 We discussed the multidisciplinary treatment of breast cancer. From a surgical standpoint we then discussed breast conservation versus mastectomy. She is interested in breast conservation. I next discussed proceeding with a radioactive seed guided left breast lumpectomy and sentinel lymph node biopsy. I explained the reasonings for evaluating the lymph nodes. I explained the surgical procedure in detail. We discussed the risks which includes but is not limited to bleeding, infection, the need for further surgery if lymph nodes or margins are positive, cardiopulmonary issues, postoperative recovery, etc. She will be referred to the cancer center to see medical and radiation oncology as well as physical therapy. I gave him a copy of the pathology results. All questions were answered. Surgery will be scheduled

## 2021-07-03 ENCOUNTER — Ambulatory Visit
Admission: RE | Admit: 2021-07-03 | Discharge: 2021-07-03 | Disposition: A | Discharge status: Home / Self Care | Payer: BC Managed Care – PPO | Source: Ambulatory Visit | Attending: Surgery | Admitting: Surgery

## 2021-07-03 ENCOUNTER — Ambulatory Visit (HOSPITAL_BASED_OUTPATIENT_CLINIC_OR_DEPARTMENT_OTHER): Payer: BC Managed Care – PPO | Admitting: Anesthesiology

## 2021-07-03 ENCOUNTER — Other Ambulatory Visit: Payer: Self-pay

## 2021-07-03 ENCOUNTER — Encounter (HOSPITAL_BASED_OUTPATIENT_CLINIC_OR_DEPARTMENT_OTHER)
Admission: RE | Disposition: A | Discharge status: Home / Self Care | Payer: Self-pay | Source: Home / Self Care | Attending: Surgery

## 2021-07-03 ENCOUNTER — Ambulatory Visit (HOSPITAL_BASED_OUTPATIENT_CLINIC_OR_DEPARTMENT_OTHER)
Admission: RE | Admit: 2021-07-03 | Discharge: 2021-07-03 | Disposition: A | Discharge status: Home / Self Care | Payer: BC Managed Care – PPO | Attending: Surgery | Admitting: Surgery

## 2021-07-03 ENCOUNTER — Encounter (HOSPITAL_BASED_OUTPATIENT_CLINIC_OR_DEPARTMENT_OTHER): Payer: Self-pay | Admitting: Surgery

## 2021-07-03 DIAGNOSIS — D630 Anemia in neoplastic disease: Secondary | ICD-10-CM

## 2021-07-03 DIAGNOSIS — C50212 Malignant neoplasm of upper-inner quadrant of left female breast: Secondary | ICD-10-CM | POA: Diagnosis present

## 2021-07-03 DIAGNOSIS — Z170 Estrogen receptor positive status [ER+]: Secondary | ICD-10-CM | POA: Insufficient documentation

## 2021-07-03 DIAGNOSIS — Z87891 Personal history of nicotine dependence: Secondary | ICD-10-CM | POA: Insufficient documentation

## 2021-07-03 DIAGNOSIS — C50912 Malignant neoplasm of unspecified site of left female breast: Secondary | ICD-10-CM

## 2021-07-03 DIAGNOSIS — Z803 Family history of malignant neoplasm of breast: Secondary | ICD-10-CM | POA: Diagnosis not present

## 2021-07-03 DIAGNOSIS — K219 Gastro-esophageal reflux disease without esophagitis: Secondary | ICD-10-CM | POA: Diagnosis not present

## 2021-07-03 DIAGNOSIS — I10 Essential (primary) hypertension: Secondary | ICD-10-CM | POA: Diagnosis not present

## 2021-07-03 DIAGNOSIS — Z79899 Other long term (current) drug therapy: Secondary | ICD-10-CM

## 2021-07-03 DIAGNOSIS — M1990 Unspecified osteoarthritis, unspecified site: Secondary | ICD-10-CM | POA: Diagnosis not present

## 2021-07-03 DIAGNOSIS — Z853 Personal history of malignant neoplasm of breast: Secondary | ICD-10-CM

## 2021-07-03 DIAGNOSIS — Z01818 Encounter for other preprocedural examination: Secondary | ICD-10-CM

## 2021-07-03 HISTORY — PX: BREAST LUMPECTOMY WITH RADIOACTIVE SEED AND SENTINEL LYMPH NODE BIOPSY: SHX6550

## 2021-07-03 SURGERY — BREAST LUMPECTOMY WITH RADIOACTIVE SEED AND SENTINEL LYMPH NODE BIOPSY
Anesthesia: General | Site: Breast | Laterality: Left

## 2021-07-03 MED ORDER — ACETAMINOPHEN 160 MG/5ML PO SOLN: 325.0000 mg | ORAL | Status: DC | PRN

## 2021-07-03 MED ORDER — ROPIVACAINE HCL 7.5 MG/ML IJ SOLN
INTRAMUSCULAR | Status: DC | PRN
  Administered 2021-07-03: 30 mL via PERINEURAL

## 2021-07-03 MED ORDER — MEPERIDINE HCL 25 MG/ML IJ SOLN: 6.2500 mg | INTRAMUSCULAR | Status: DC | PRN

## 2021-07-03 MED ORDER — CIPROFLOXACIN IN D5W 400 MG/200ML IV SOLN
400.0000 mg | INTRAVENOUS | Status: AC
  Administered 2021-07-03: 400 mg via INTRAVENOUS

## 2021-07-03 MED ORDER — CHLORHEXIDINE GLUCONATE CLOTH 2 % EX PADS: 6.0000 | MEDICATED_PAD | Freq: Once | CUTANEOUS | Status: DC

## 2021-07-03 MED ORDER — ONDANSETRON HCL 4 MG/2ML IJ SOLN
INTRAMUSCULAR | Status: DC | PRN
  Administered 2021-07-03: 4 mg via INTRAVENOUS

## 2021-07-03 MED ORDER — FENTANYL CITRATE (PF) 100 MCG/2ML IJ SOLN
INTRAMUSCULAR | Status: AC
  Filled 2021-07-03: qty 2

## 2021-07-03 MED ORDER — DEXAMETHASONE SODIUM PHOSPHATE 4 MG/ML IJ SOLN
INTRAMUSCULAR | Status: DC | PRN
  Administered 2021-07-03: 4 mg via INTRAVENOUS

## 2021-07-03 MED ORDER — ONDANSETRON HCL 4 MG/2ML IJ SOLN: 4.0000 mg | Freq: Once | INTRAMUSCULAR | Status: DC | PRN

## 2021-07-03 MED ORDER — MAGTRACE LYMPHATIC TRACER
INTRAMUSCULAR | Status: DC | PRN
  Administered 2021-07-03: 2 mL via INTRAMUSCULAR

## 2021-07-03 MED ORDER — MIDAZOLAM HCL 2 MG/2ML IJ SOLN
2.0000 mg | Freq: Once | INTRAMUSCULAR | Status: AC
  Administered 2021-07-03: 2 mg via INTRAVENOUS

## 2021-07-03 MED ORDER — ACETAMINOPHEN 500 MG PO TABS
ORAL_TABLET | ORAL | Status: AC
  Filled 2021-07-03: qty 2

## 2021-07-03 MED ORDER — OXYCODONE HCL 5 MG/5ML PO SOLN: 5.0000 mg | Freq: Once | ORAL | Status: DC | PRN

## 2021-07-03 MED ORDER — PHENYLEPHRINE 80 MCG/ML (10ML) SYRINGE FOR IV PUSH (FOR BLOOD PRESSURE SUPPORT)
PREFILLED_SYRINGE | INTRAVENOUS | Status: AC
Start: 2021-07-03 — End: ?
  Filled 2021-07-03: qty 30

## 2021-07-03 MED ORDER — LACTATED RINGERS IV SOLN: INTRAVENOUS | Status: DC

## 2021-07-03 MED ORDER — FENTANYL CITRATE (PF) 100 MCG/2ML IJ SOLN
100.0000 ug | Freq: Once | INTRAMUSCULAR | Status: AC
  Administered 2021-07-03: 100 ug via INTRAVENOUS

## 2021-07-03 MED ORDER — PROPOFOL 10 MG/ML IV BOLUS
INTRAVENOUS | Status: DC | PRN
  Administered 2021-07-03: 150 mg via INTRAVENOUS

## 2021-07-03 MED ORDER — PROPOFOL 500 MG/50ML IV EMUL
INTRAVENOUS | Status: DC | PRN
  Administered 2021-07-03: 25 ug/kg/min via INTRAVENOUS

## 2021-07-03 MED ORDER — OXYCODONE HCL 5 MG PO TABS: 5.0000 mg | ORAL_TABLET | Freq: Once | ORAL | Status: DC | PRN

## 2021-07-03 MED ORDER — FENTANYL CITRATE (PF) 100 MCG/2ML IJ SOLN: 25.0000 ug | INTRAMUSCULAR | Status: DC | PRN

## 2021-07-03 MED ORDER — FENTANYL CITRATE (PF) 100 MCG/2ML IJ SOLN
INTRAMUSCULAR | Status: DC | PRN
  Administered 2021-07-03: 25 ug via INTRAVENOUS
  Administered 2021-07-03: 50 ug via INTRAVENOUS
  Administered 2021-07-03: 25 ug via INTRAVENOUS

## 2021-07-03 MED ORDER — MIDAZOLAM HCL 2 MG/2ML IJ SOLN
INTRAMUSCULAR | Status: AC
  Filled 2021-07-03: qty 2

## 2021-07-03 MED ORDER — LIDOCAINE 2% (20 MG/ML) 5 ML SYRINGE
INTRAMUSCULAR | Status: DC | PRN
  Administered 2021-07-03: 30 mg via INTRAVENOUS

## 2021-07-03 MED ORDER — BUPIVACAINE-EPINEPHRINE 0.5% -1:200000 IJ SOLN
INTRAMUSCULAR | Status: DC | PRN
  Administered 2021-07-03: 24 mL

## 2021-07-03 MED ORDER — ACETAMINOPHEN 500 MG PO TABS
1000.0000 mg | ORAL_TABLET | ORAL | Status: AC
  Administered 2021-07-03: 1000 mg via ORAL

## 2021-07-03 MED ORDER — ACETAMINOPHEN 325 MG PO TABS: 325.0000 mg | ORAL_TABLET | ORAL | Status: DC | PRN

## 2021-07-03 MED ORDER — EPHEDRINE 5 MG/ML INJ
INTRAVENOUS | Status: AC
  Filled 2021-07-03: qty 15

## 2021-07-03 MED ORDER — EPHEDRINE SULFATE (PRESSORS) 50 MG/ML IJ SOLN
INTRAMUSCULAR | Status: DC | PRN
  Administered 2021-07-03 (×2): 10 mg via INTRAVENOUS

## 2021-07-03 MED ORDER — DEXAMETHASONE SODIUM PHOSPHATE 10 MG/ML IJ SOLN
INTRAMUSCULAR | Status: DC | PRN
  Administered 2021-07-03: 10 mg

## 2021-07-03 MED ORDER — TRAMADOL HCL 50 MG PO TABS: 50.0000 mg | ORAL_TABLET | Freq: Four times a day (QID) | ORAL | 0 refills | Status: DC | PRN

## 2021-07-03 MED ORDER — CIPROFLOXACIN IN D5W 400 MG/200ML IV SOLN
INTRAVENOUS | Status: AC
Start: 2021-07-03 — End: ?
  Filled 2021-07-03: qty 200

## 2021-07-03 SURGICAL SUPPLY — 48 items
ADH SKN CLS APL DERMABOND .7 (GAUZE/BANDAGES/DRESSINGS) ×1
APL PRP STRL LF DISP 70% ISPRP (MISCELLANEOUS) ×1
APPLIER CLIP 9.375 MED OPEN (MISCELLANEOUS) ×2
APR CLP MED 9.3 20 MLT OPN (MISCELLANEOUS) ×1
BLADE SURG 15 STRL LF DISP TIS (BLADE) ×2 IMPLANT
BLADE SURG 15 STRL SS (BLADE) ×2
CANISTER SUCT 1200ML W/VALVE (MISCELLANEOUS) IMPLANT
CHLORAPREP W/TINT 26 (MISCELLANEOUS) ×3 IMPLANT
CLIP APPLIE 9.375 MED OPEN (MISCELLANEOUS) ×2 IMPLANT
COVER BACK TABLE 60X90IN (DRAPES) ×3 IMPLANT
COVER MAYO STAND STRL (DRAPES) ×3 IMPLANT
COVER PROBE W GEL 5X96 (DRAPES) ×3 IMPLANT
DERMABOND ADVANCED (GAUZE/BANDAGES/DRESSINGS) ×1
DERMABOND ADVANCED .7 DNX12 (GAUZE/BANDAGES/DRESSINGS) ×2 IMPLANT
DRAPE LAPAROSCOPIC ABDOMINAL (DRAPES) ×3 IMPLANT
DRAPE UTILITY XL STRL (DRAPES) ×3 IMPLANT
ELECT REM PT RETURN 9FT ADLT (ELECTROSURGICAL) ×2
ELECTRODE REM PT RTRN 9FT ADLT (ELECTROSURGICAL) ×2 IMPLANT
GLOVE BIO SURGEON STRL SZ7 (GLOVE) ×1 IMPLANT
GLOVE BIOGEL PI IND STRL 7.0 (GLOVE) IMPLANT
GLOVE BIOGEL PI INDICATOR 7.0 (GLOVE) ×1
GLOVE SURG SIGNA 7.5 PF LTX (GLOVE) ×3 IMPLANT
GLOVE SURG SS PI 7.0 STRL IVOR (GLOVE) ×1 IMPLANT
GOWN STRL REUS W/ TWL LRG LVL3 (GOWN DISPOSABLE) ×2 IMPLANT
GOWN STRL REUS W/ TWL XL LVL3 (GOWN DISPOSABLE) ×2 IMPLANT
GOWN STRL REUS W/TWL LRG LVL3 (GOWN DISPOSABLE) ×2
GOWN STRL REUS W/TWL XL LVL3 (GOWN DISPOSABLE) ×4
KIT MARKER MARGIN INK (KITS) ×3 IMPLANT
MAT PREVALON FULL STRYKER (MISCELLANEOUS) ×1 IMPLANT
NDL HYPO 25X1 1.5 SAFETY (NEEDLE) ×2 IMPLANT
NDL SAFETY ECLIPSE 18X1.5 (NEEDLE) ×2 IMPLANT
NEEDLE HYPO 18GX1.5 SHARP (NEEDLE) ×2
NEEDLE HYPO 25X1 1.5 SAFETY (NEEDLE) ×2 IMPLANT
NS IRRIG 1000ML POUR BTL (IV SOLUTION) ×3 IMPLANT
PACK BASIN DAY SURGERY FS (CUSTOM PROCEDURE TRAY) ×3 IMPLANT
PENCIL SMOKE EVACUATOR (MISCELLANEOUS) ×3 IMPLANT
SLEEVE SCD COMPRESS KNEE MED (STOCKING) ×3 IMPLANT
SPONGE T-LAP 4X18 ~~LOC~~+RFID (SPONGE) ×3 IMPLANT
SUT MNCRL AB 4-0 PS2 18 (SUTURE) ×3 IMPLANT
SUT SILK 2 0 SH (SUTURE) IMPLANT
SUT VIC AB 3-0 SH 27 (SUTURE) ×2
SUT VIC AB 3-0 SH 27X BRD (SUTURE) ×2 IMPLANT
SYR CONTROL 10ML LL (SYRINGE) ×3 IMPLANT
TOWEL GREEN STERILE FF (TOWEL DISPOSABLE) ×3 IMPLANT
TRACER MAGTRACE VIAL (MISCELLANEOUS) ×1 IMPLANT
TRAY FAXITRON CT DISP (TRAY / TRAY PROCEDURE) ×3 IMPLANT
TUBE CONNECTING 20X1/4 (TUBING) ×1 IMPLANT
YANKAUER SUCT BULB TIP NO VENT (SUCTIONS) ×1 IMPLANT

## 2021-07-03 NOTE — Anesthesia Procedure Notes (Signed)
Procedure Name: LMA Insertion Date/Time: 07/03/2021 12:16 PM  Performed by: Carrol Hougland, Ernesta Amble, CRNAPre-anesthesia Checklist: Patient identified, Emergency Drugs available, Suction available and Patient being monitored Patient Re-evaluated:Patient Re-evaluated prior to induction Oxygen Delivery Method: Circle system utilized Preoxygenation: Pre-oxygenation with 100% oxygen Induction Type: IV induction Ventilation: Mask ventilation without difficulty LMA: LMA inserted LMA Size: 4.0 Number of attempts: 1 Airway Equipment and Method: Bite block Placement Confirmation: positive ETCO2 Tube secured with: Tape Dental Injury: Teeth and Oropharynx as per pre-operative assessment

## 2021-07-03 NOTE — Op Note (Signed)
Preoperative diagnosis: 76m breast lesion identified on screening mammography, biopsy demonstrated invasive ductal carcinoma ER+/PR+  Postoperative diagnosis: same   Procedure: LEFT breast lumpectomy, deep axillary sentinel node biopsy, and injection of Magtrace for lymph node mapping.   Surgeon: DCoralie Keens M.D.  Asst: HRadonna Ricker MD  Anesthesia: LMA General  Indications for procedure: Catherine Getchellis a 66y.o. year old female with a 620mbreast lesion identified on screening mammography, biopsy demonstrated invasive ductal carcinoma ER+/PR+.  Description of procedure: The patient was brought into the operative suite. Anesthesia was administered with General LMA anesthesia. WHO checklist was applied. The patient was then placed in supine position. The area was prepped and draped in the usual sterile fashion.  A timeout was called correctly identifying the patient, the laterality as LEFT, and the procedure as LEFT breast lumpectomy, deep axillary sentinel node biopsy, and injection of Magtrace for lymph node mapping.   We began by using the neoprobe to identify the radioactive seed, which was present in the superior medial breast at roughly the 10 o'clock position. We instilled local anesthetic and made a periareolar incision. We raised skin flaps and carried this dissection superiorly and medially, repeatedly checking the position of the radioactive seed. We carried our dissection down to the pectoralis. We then externalized the tissue and created a deep margin. The specimen was excised and painted to identify the superior, inferior, medial, lateral, anterior, and posterior margins. We confirmed hemostasis. We then approximated the skin using 3-0 vicryls and closed the skin with subcuticualr 4-0 monocryl.   We then turned our attention to the axilla. We identified an area of magrtrace signal using the probe and made a curvilinear incision in the axilla. We carried this  dissection down repeatedly checking for magtrace signal. We identified an area of high focus and excised this confirming magtrace signal after excision. There were visible lymph nodes in the specimen. We confirmed hemostasis. We then approximated the skin using 3-0 vicryls and closed the skin with subcuticualr 4-0 monocryl.   Both incisions were sealed with dermabond.   All counts were correct at the end of the case.     Findings: Lumpectomy specimen containing clips and radioactive seed, axillary nodes with magtrace signal  Specimen: LEFT breas lumpectomy and LEFT axillary nodes  Implant: none   Blood loss: minimal   Local anesthesia:  25 ml marcaine   Complications: none  HaRadonna RickerM.D. PGY-5  [facility]

## 2021-07-03 NOTE — Anesthesia Procedure Notes (Signed)
Anesthesia Regional Block: Pectoralis block   Pre-Anesthetic Checklist: , timeout performed,  Correct Patient, Correct Site, Correct Laterality,  Correct Procedure, Correct Position, site marked,  Risks and benefits discussed,  Surgical consent,  Pre-op evaluation,  At surgeon's request and post-op pain management  Laterality: Left  Prep: chloraprep       Needles:  Injection technique: Single-shot  Needle Type: Echogenic Stimulator Needle     Needle Length: 5cm  Needle Gauge: 22     Additional Needles:   Procedures:, nerve stimulator,,, ultrasound used (permanent image in chart),,    Narrative:  Start time: 07/03/2021 10:07 AM End time: 07/03/2021 10:11 AM Injection made incrementally with aspirations every 5 mL.  Performed by: Personally  Anesthesiologist: Janeece Riggers, MD  Additional Notes: Functioning IV was confirmed and monitors were applied.  A 20m 22ga Arrow echogenic stimulator needle was used. Sterile prep and drape,hand hygiene and sterile gloves were used. Ultrasound guidance: relevant anatomy identified, needle position confirmed, local anesthetic spread visualized around nerve(s)., vascular puncture avoided.  Image printed for medical record. Negative aspiration and negative test dose prior to incremental administration of local anesthetic. The patient tolerated the procedure well.

## 2021-07-03 NOTE — Interval H&P Note (Signed)
History and Physical Interval Note:no change in H and P  07/03/2021 10:02 AM  Catherine Navarro  has presented today for surgery, with the diagnosis of LEFT BREAST CANCER.  The various methods of treatment have been discussed with the patient and family. After consideration of risks, benefits and other options for treatment, the patient has consented to  Procedure(s) with comments: LEFT BREAST LUMPECTOMY WITH RADIOACTIVE SEED AND SENTINEL LYMPH NODE BIOPSY (Left) - LMA as a surgical intervention.  The patient's history has been reviewed, patient examined, no change in status, stable for surgery.  I have reviewed the patient's chart and labs.  Questions were answered to the patient's satisfaction.     Coralie Keens

## 2021-07-03 NOTE — Anesthesia Preprocedure Evaluation (Addendum)
Anesthesia Evaluation  Patient identified by MRN, date of birth, ID band Patient awake    Reviewed: Allergy & Precautions, H&P , NPO status , Patient's Chart, lab work & pertinent test results, reviewed documented beta blocker date and time   Airway Mallampati: II  TM Distance: >3 FB Neck ROM: full    Dental no notable dental hx. (+) Teeth Intact, Dental Advisory Given   Pulmonary neg pulmonary ROS, former smoker,    Pulmonary exam normal breath sounds clear to auscultation       Cardiovascular Exercise Tolerance: Good hypertension, negative cardio ROS   Rhythm:regular Rate:Normal     Neuro/Psych  Neuromuscular disease negative psych ROS   GI/Hepatic Neg liver ROS, hiatal hernia, GERD  Medicated,  Endo/Other  Hypothyroidism   Renal/GU negative Renal ROS  negative genitourinary   Musculoskeletal  (+) Arthritis , Osteoarthritis,    Abdominal   Peds  Hematology  (+) Blood dyscrasia, anemia ,   Anesthesia Other Findings   Reproductive/Obstetrics negative OB ROS                             Anesthesia Physical Anesthesia Plan  ASA: 3  Anesthesia Plan: General   Post-op Pain Management: Regional block* and Minimal or no pain anticipated   Induction: Intravenous  PONV Risk Score and Plan: 3 and Ondansetron, Treatment may vary due to age or medical condition and Midazolam  Airway Management Planned: LMA and Oral ETT  Additional Equipment: None  Intra-op Plan:   Post-operative Plan: Extubation in OR  Informed Consent: I have reviewed the patients History and Physical, chart, labs and discussed the procedure including the risks, benefits and alternatives for the proposed anesthesia with the patient or authorized representative who has indicated his/her understanding and acceptance.     Dental Advisory Given  Plan Discussed with: CRNA and Anesthesiologist  Anesthesia Plan  Comments: (  )        Anesthesia Quick Evaluation

## 2021-07-03 NOTE — Discharge Instructions (Addendum)
[facility] °Office Phone Number [phone] ° °BREAST BIOPSY/ PARTIAL MASTECTOMY: POST OP INSTRUCTIONS ° °Always review your discharge instruction sheet given to you by the facility where your surgery was performed. ° °IF YOU HAVE DISABILITY OR FAMILY LEAVE FORMS, YOU MUST BRING THEM TO THE OFFICE FOR PROCESSING.  DO NOT GIVE THEM TO YOUR DOCTOR. ° °A prescription for pain medication may be given to you upon discharge.  Take your pain medication as prescribed, if needed.  If narcotic pain medicine is not needed, then you may take acetaminophen (Tylenol) or ibuprofen (Advil) as needed. °Take your usually prescribed medications unless otherwise directed °If you need a refill on your pain medication, please contact your pharmacy.  They will contact our office to request authorization.  Prescriptions will not be filled after 5pm or on week-ends. °You should eat very light the first 24 hours after surgery, such as soup, crackers, pudding, etc.  Resume your normal diet the day after surgery. °Most patients will experience some swelling and bruising in the breast.  Ice packs and a good support bra will help.  Swelling and bruising can take several days to resolve.  °It is common to experience some constipation if taking pain medication after surgery.  Increasing fluid intake and taking a stool softener will usually help or prevent this problem from occurring.  A mild laxative (Milk of Magnesia or Miralax) should be taken according to package directions if there are no bowel movements after 48 hours. °Unless discharge instructions indicate otherwise, you may remove your bandages 24-48 hours after surgery, and you may shower at that time.  You may have steri-strips (small skin tapes) in place directly over the incision.  These strips should be left on the skin for 7-10 days.  If your surgeon used skin glue on the incision, you may shower in 24 hours.  The glue will flake off over the next 2-3 weeks.  Any  sutures or staples will be removed at the office during your follow-up visit. °ACTIVITIES:  You may resume regular daily activities (gradually increasing) beginning the next day.  Wearing a good support bra or sports bra minimizes pain and swelling.  You may have sexual intercourse when it is comfortable. °You may drive when you no longer are taking prescription pain medication, you can comfortably wear a seatbelt, and you can safely maneuver your car and apply brakes. °RETURN TO WORK:  ______________________________________________________________________________________ °You should see your doctor in the office for a follow-up appointment approximately two weeks after your surgery.  Your doctor’s nurse will typically make your follow-up appointment when she calls you with your pathology report.  Expect your pathology report 2-3 business days after your surgery.  You may call to check if you do not hear from us after three days. °OTHER INSTRUCTIONS: OK TO REMOVE THE BINDER AND SHOWER STARTING TOMORROW °ICE PACK, TYLENOL, AND IBUPROFEN ALSO FOR PAIN °NO VIGOROUS ACTIVITY FOR ONE WEEK °_______________________________________________________________________________________________ _____________________________________________________________________________________________________________________________________ °_____________________________________________________________________________________________________________________________________ °_____________________________________________________________________________________________________________________________________ ° °WHEN TO CALL YOUR DOCTOR: °Fever over 101.0 °Nausea and/or vomiting. °Extreme swelling or bruising. °Continued bleeding from incision. °Increased pain, redness, or drainage from the incision. ° °The clinic staff is available to answer your questions during regular business hours.  Please don’t hesitate to call and ask to speak to one of the  nurses for clinical concerns.  If you have a medical emergency, go to the nearest emergency room or call 911.  A surgeon from [facility] is always on call at the hospital. ° °For   further questions, please visit [email or web address]    No Tylenol before 4pm 07/03/21   Post Anesthesia Home Care Instructions  Activity: Get plenty of rest for the remainder of the day. A responsible individual must stay with you for 24 hours following the procedure.  For the next 24 hours, DO NOT: -Drive a car -Paediatric nurse -Drink alcoholic beverages -Take any medication unless instructed by your physician -Make any legal decisions or sign important papers.  Meals: Start with liquid foods such as gelatin or soup. Progress to regular foods as tolerated. Avoid greasy, spicy, heavy foods. If nausea and/or vomiting occur, drink only clear liquids until the nausea and/or vomiting subsides. Call your physician if vomiting continues.  Special Instructions/Symptoms: Your throat may feel dry or sore from the anesthesia or the breathing tube placed in your throat during surgery. If this causes discomfort, gargle with warm salt water. The discomfort should disappear within 24 hours.  If you had a scopolamine patch placed behind your ear for the management of post- operative nausea and/or vomiting:  1. The medication in the patch is effective for 72 hours, after which it should be removed.  Wrap patch in a tissue and discard in the trash. Wash hands thoroughly with soap and water. 2. You may remove the patch earlier than 72 hours if you experience unpleasant side effects which may include dry mouth, dizziness or visual disturbances. 3. Avoid touching the patch. Wash your hands with soap and water after contact with the patch.

## 2021-07-03 NOTE — Transfer of Care (Signed)
Immediate Anesthesia Transfer of Care Note  Patient: Catherine Navarro  Procedure(s) Performed: LEFT BREAST LUMPECTOMY WITH RADIOACTIVE SEED AND SENTINEL LYMPH NODE BIOPSY (Left: Breast)  Patient Location: PACU  Anesthesia Type:General  Level of Consciousness: awake, alert  and oriented  Airway & Oxygen Therapy: Patient Spontanous Breathing and Patient connected to face mask oxygen  Post-op Assessment: Report given to RN and Post -op Vital signs reviewed and stable  Post vital signs: Reviewed and stable  Last Vitals:  Vitals Value Taken Time  BP    Temp    Pulse 58 07/03/21 1329  Resp 16 07/03/21 1329  SpO2 95 % 07/03/21 1329  Vitals shown include unvalidated device data.  Last Pain:  Vitals:   07/03/21 0948  TempSrc: Oral  PainSc: 0-No pain         Complications: No notable events documented.

## 2021-07-03 NOTE — Anesthesia Postprocedure Evaluation (Signed)
Anesthesia Post Note  Patient: Flonnie Hailstone  Procedure(s) Performed: LEFT BREAST LUMPECTOMY WITH RADIOACTIVE SEED AND SENTINEL LYMPH NODE BIOPSY (Left: Breast)     Patient location during evaluation: PACU Anesthesia Type: General Level of consciousness: awake and alert Pain management: pain level controlled Vital Signs Assessment: post-procedure vital signs reviewed and stable Respiratory status: spontaneous breathing, nonlabored ventilation, respiratory function stable and patient connected to nasal cannula oxygen Cardiovascular status: blood pressure returned to baseline and stable Postop Assessment: no apparent nausea or vomiting Anesthetic complications: no   No notable events documented.  Last Vitals:  Vitals:   07/03/21 1400 07/03/21 1415  BP: 119/67 (!) 149/85  Pulse: (!) 56 65  Resp: 14 (!) 22  Temp:    SpO2: 96% 93%    Last Pain:  Vitals:   07/03/21 1429  TempSrc:   PainSc: 0-No pain                 Tyrann Donaho

## 2021-07-03 NOTE — Progress Notes (Signed)
Assisted Dr. Oddono with left, pectoralis, ultrasound guided block. Side rails up, monitors on throughout procedure. See vital signs in flow sheet. Tolerated Procedure well. 

## 2021-07-04 ENCOUNTER — Encounter (HOSPITAL_BASED_OUTPATIENT_CLINIC_OR_DEPARTMENT_OTHER): Payer: Self-pay | Admitting: Surgery

## 2021-07-07 LAB — SURGICAL PATHOLOGY

## 2021-07-10 ENCOUNTER — Encounter: Payer: Self-pay | Admitting: *Deleted

## 2021-07-10 ENCOUNTER — Telehealth: Payer: Self-pay | Admitting: *Deleted

## 2021-07-10 ENCOUNTER — Inpatient Hospital Stay (HOSPITAL_BASED_OUTPATIENT_CLINIC_OR_DEPARTMENT_OTHER): Payer: BC Managed Care – PPO | Admitting: Hematology and Oncology

## 2021-07-10 ENCOUNTER — Telehealth: Payer: BC Managed Care – PPO | Admitting: Hematology and Oncology

## 2021-07-10 DIAGNOSIS — C50412 Malignant neoplasm of upper-outer quadrant of left female breast: Secondary | ICD-10-CM

## 2021-07-10 DIAGNOSIS — Z170 Estrogen receptor positive status [ER+]: Secondary | ICD-10-CM

## 2021-07-10 NOTE — Assessment & Plan Note (Signed)
Screening mammogram detected asymmetry left breast, ultrasound revealed 6 mm irregular mass 10 o'clock position: Biopsy: Invasive mammary cancer grade 2, ER 100%, PR 100%, Ki-67 15%, HER2 equivocal 2+ by IHC, FISH negative  07/03/21: Left Lumpectomy Grade 2 IDC, 0/2 LN Neg, ER 100%, PR 100%, Ki-67 15%, HER2 equivocal 2+ by IHC, FISH negative   Pathology counseling: I discussed the final pathology report of the patient provided  a copy of this report. I discussed the margins as well as lymph node surgeries. We also discussed the final staging along with previously performed ER/PR and HER-2/neu testing.  Recommendation: 1. Oncotype Dx testing to determine if she would benefit from chemotherapy 2.  Adjuvant radiation (she lives 2 hours away and wishes to receive radiation locally closer to her home) 3.  Followed by adjuvant antiestrogen therapy with letrozole 2.5 mg daily x5 years  RTC based on oncotype result

## 2021-07-10 NOTE — Telephone Encounter (Signed)
Received order for oncotype testing. Requisition faxed to pathology and [facility] °

## 2021-07-11 ENCOUNTER — Telehealth: Payer: BC Managed Care – PPO | Admitting: Hematology and Oncology

## 2021-07-17 ENCOUNTER — Telehealth: Payer: Self-pay | Admitting: Genetic Counselor

## 2021-07-17 ENCOUNTER — Encounter: Payer: Self-pay | Admitting: Genetic Counselor

## 2021-07-17 ENCOUNTER — Ambulatory Visit: Payer: Self-pay | Admitting: Genetic Counselor

## 2021-07-17 DIAGNOSIS — Z1379 Encounter for other screening for genetic and chromosomal anomalies: Secondary | ICD-10-CM | POA: Insufficient documentation

## 2021-07-17 NOTE — Telephone Encounter (Signed)
Revealed negative genetic testing.  Discussed that we do not know why she has breast cancer or why there is cancer in the family. It could be due to a different gene that we are not testing, or maybe our current technology may not be able to pick something up.  It will be important for her to keep in contact with genetics to keep up with whether additional testing may be needed. 

## 2021-07-22 ENCOUNTER — Encounter (HOSPITAL_COMMUNITY): Payer: Self-pay

## 2021-07-24 NOTE — Therapy (Addendum)
OUTPATIENT PHYSICAL THERAPY BREAST CANCER POST OP FOLLOW UP   Patient Name: Catherine Navarro MRN: [id number] DOB:December 13, 1955, 66 y.o., female Today's Date: 07/25/2021   PT End of Session - 07/25/21 1115     Visit Number 2    Number of Visits 2    Date for PT Re-Evaluation 07/25/21    PT Start Time 1115    PT Stop Time 2836    PT Time Calculation (min) 44 min    Activity Tolerance Patient tolerated treatment well    Behavior During Therapy [facility] for tasks assessed/performed             Past Medical History:  Diagnosis Date   Abdominal pain 11/27/2010   Accessory skin tags 01/25/2013   Allergy    Anemia    past hx   Anisocoria    right eye   Cancer ([place])    skin cancer removed from nose    Colon polyps 12/22/2010   Tubular Adenoma and Hyperplastic Polyp   DDD (degenerative disc disease), cervical    Elevated LFTs 11/27/2010   Family history of breast cancer    Family history of colon cancer    Family history of colon cancer    Fatty infiltration of liver    Gastritis    GERD (gastroesophageal reflux disease)    Hiatal hernia    HTN (hypertension)    Hypothyroidism    with nodules   IBS (irritable bowel syndrome)    Muscle spasm of back 06/04/2015   Osteoarthritis    Pancreatitis    Sinusitis    Past Surgical History:  Procedure Laterality Date   ANAL FISTULECTOMY     BREAST LUMPECTOMY WITH RADIOACTIVE SEED AND SENTINEL LYMPH NODE BIOPSY Left 07/03/2021   Procedure: LEFT BREAST LUMPECTOMY WITH RADIOACTIVE SEED AND SENTINEL LYMPH NODE BIOPSY;  Surgeon: Coralie Keens, MD;  Location: [place];  Service: General;  Laterality: Left;  LMA   CHOLECYSTECTOMY     COLONOSCOPY     COLONOSCOPY W/ BIOPSIES     ESOPHAGOGASTRODUODENOSCOPY     EYE SURGERY     PATELLA RECONSTRUCTION     pins   POLYPECTOMY     REPLACEMENT TOTAL KNEE Right 12/2017   TUBAL LIGATION     UPPER GASTROINTESTINAL ENDOSCOPY     Patient Active Problem List   Diagnosis  Date Noted   Genetic testing 07/17/2021   Family history of colon cancer 06/25/2021   Family history of breast cancer 06/25/2021   Malignant neoplasm of upper-outer quadrant of left female breast ([place]) 06/20/2021   Screening for colorectal cancer 02/07/2019   Encounter for gynecological examination with Papanicolaou smear of cervix 02/07/2019   Enlarged thyroid 02/07/2019   Hyperglycemia 12/29/2018   H/O hypokalemia 12/29/2018   Pain in right knee 07/21/2017   Aortic atherosclerosis ([place]) 01/04/2017   Degenerative disc disease, lumbar 01/04/2017   Multinodular goiter 06/17/2016   Muscle spasm of back 06/04/2015   Cutaneous skin tags 04/12/2014   BMI 36.0-36.9,adult 08/01/2013   Anisocoria 07/26/2012   Family history of malignant neoplasm of gastrointestinal tract 12/22/2010   Diverticulosis of colon (without mention of hemorrhage) 12/22/2010   Benign neoplasm of colon 12/22/2010   Fatty liver 11/27/2010   Costochondral chest pain 11/27/2010   GERD (gastroesophageal reflux disease) 62/94/7654   HELICOBACTER PYLORI GASTRITIS, HX OF 05/02/2009   ABDOMINAL PAIN-RUQ 07/12/2007   Essential hypertension 03/23/2007   Allergic rhinitis 03/23/2007   IBS 03/23/2007   Osteoarthritis of right knee  03/23/2007   H/O non anemic vitamin B12 deficiency 03/23/2007   History of acute pancreatitis 03/23/2007   GASTRITIS 12/21/2005   HIATAL HERNIA 12/21/2005    PCP: Paticia Stack, MD  REFERRING PROVIDER: Coralie Keens, MD  REFERRING DIAG: Left Breast Cancer  THERAPY DIAG:  Malignant neoplasm of left breast in female, estrogen receptor positive, unspecified site of breast ([place])  Abnormal posture  Rationale for Evaluation and Treatment Rehabilitation  ONSET DATE: 06/11/2021  SUBJECTIVE:                                                                                                                                                                                           SUBJECTIVE  STATEMENT: Surgery went well. Oncotype came back as 0 so no chemo.  I will have to do 4 weeks of radiation . Have radiation simulation July 10. I go on vacation after that and start on July 17. My arm has hurt all down my arm, but not bad. All my life when I use my arms my arms tremble. I weed eated, hoed the garden so both arms hurt but more on the left.My left lateral breast is very hypersensitive.  PERTINENT HISTORY:  diagnosed on 06/11/2021 with left Breast Cancer. It measures .6 cm and is located in the 10:00 position of the left breast . It is ER+, PR+ with a Ki67 of 15%. HER 2 is undetermined presently. She is s/p left lumpectomy with SLNB on 07/03/2021  PATIENT GOALS:  Reassess how my recovery is going related to arm function, pain, and swelling.  PAIN:  Are you having pain? No, but sometimes feels like a constant ache after using my arm a lot, especially the left  PRECAUTIONS: Recent Surgery, left UE Lymphedema risk,   ACTIVITY LEVEL / LEISURE: canning, yardwork, caring for chickens, tend garden and pool   OBJECTIVE:   PATIENT SURVEYS:  QUICK DASH: 4.55  OBSERVATIONS:  Axillary incision healed with slight area of nodular scar tissue. Breast incision at areola healing but with several small scabs present. No significant swelling  POSTURE:  Forward head rounded shoulders  LYMPHEDEMA ASSESSMENT:     UPPER EXTREMITY AROM/PROM:   A/PROM RIGHT   eval    Shoulder extension 49  Shoulder flexion 170  Shoulder abduction 179  Shoulder internal rotation 65  Shoulder external rotation 96                          (Blank rows = not tested)   A/PROM LEFT   eval 07/25/2021  Shoulder extension 59 55  Shoulder flexion 165 163  Shoulder  abduction 170 175  Shoulder internal rotation 70 70  Shoulder external rotation 107 104                          (Blank rows = not tested)     CERVICAL AROM: All within functional limits:        UPPER EXTREMITY STRENGTH: WNL      LYMPHEDEMA ASSESSMENTS:    LANDMARK RIGHT   eval  10 cm proximal to olecranon process 38.7  Olecranon process 31.6  10 cm proximal to ulnar styloid process 27.5  Just proximal to ulnar styloid process 18.7  Across hand at thumb web space 20.6  At base of 2nd digit 6.95  (Blank rows = not tested)   LANDMARK LEFT   eval 07/25/2021  10 cm proximal to olecranon process 37.5 37.7  Olecranon process 31.1 31.1  10 cm proximal to ulnar styloid process 26.8 27  Just proximal to ulnar styloid process 19.2 19.1  Across hand at thumb web space 20.8 20.7  At base of 2nd digit 7.1 7.1  (Blank rows = not tested)      Surgery type/Date: 07/03/2021 Left lumpectomy with SLNB Number of lymph nodes removed: 0/2 Current/past treatment (chemo, radiation, hormone therapy): pending oncotype for chemotherapy, will have radiation closer to home and anti estrogen therapy Other symptoms:  Heaviness/tightness No Pain Yes Pitting edema No Infections No Decreased scar mobility Yesslight Stemmer sign No   PATIENT EDUCATION:  Education details: Scar massage, ABC class, SOZO, compression bra (try with foam pads to see if it will be more comfortable. Wear as long as possible throughout radiation, gave script for class 1 compression sleeve/gauntlet/flat knit Person educated: Patient Education method: Explanation and Handouts Education comprehension: verbalized understanding   HOME EXERCISE PROGRAM:  Reviewed previously given post op HEP. Pt reminded to continue to perform throughout radiation  ASSESSMENT:  CLINICAL IMPRESSION: Pt is s/p left lumpectomy with SLNB on 07/03/2021. She does not require chemotherapy, but will be having radiation. Shoulder ROM is doing very well with minimal limitation. She was advised to continue her exercises throughout radiation and to wear her compression bra if possible.  There is no sign of Lymphedema. She will attend the Webex ABC class on July 3, and her SOZO  screen was  arranged. Her incisions are healing and she may start scar massage to axillary incision, but wait until scabs are off breast incision .  There is no visible swelling presently. She was given a script for a prophylactic compression sleeve since she does Fly fairly often. She has no further PT needs at this time.   Pt will benefit from skilled therapeutic intervention to improve on the following deficits: Decreased knowledge of precautions, impaired UE functional use, pain, decreased ROM, postural dysfunction.   PT treatment/interventions: ADL/Self care home management, Therapeutic exercises, Patient/Family education, and scar mobilization     GOALS: Goals reviewed with patient? Yes  LONG TERM GOALS:  (STG=LTG)  GOALS Name Target Date  Goal status  1 Pt will demonstrate she has regained full shoulder ROM and function post operatively compared to baselines.  Baseline: 07/25/2021 MET  _0 PLAN: PT FREQUENCY/DURATION: No further visits set up. No needs identified at this time  PLAN FOR NEXT SESSION: DC today to independent self management  PHYSICAL THERAPY DISCHARGE SUMMARY  Visits from Start of Care: 2  Current functional level related to goals / functional outcomes: Achieved goals   Remaining deficits: none   Education / Equipment: Foam pads for border of compression bra, Script for compression sleeve   Patient agrees to discharge. Patient goals were met. Patient is being discharged due to meeting the stated rehab goals.   [facility]  [address]  [place] [address]  [phone]  After Breast Cancer Class It is recommended you attend the ABC class to be educated on lymphedema risk reduction. This class is free of charge and lasts for 1 hour. It is a 1-time class. You will need to download the Webex app either on your phone or computer. We will send you a link the night before or the morning of the class. You should  be able to click on that link to join the class. This is not a confidential class. You don't have to turn your camera on, but other participants may be able to see your email address.  Scar massage You can begin gentle scar massage to you incision sites. Gently place one hand on the incision and move the skin (without sliding on the skin) in various directions. Do this for a few minutes and then you can gently massage either coconut oil or vitamin E cream into the scars.  Compression garment You should continue wearing your compression bra until you feel like you no longer have swelling.  Home exercise Program Continue doing the exercises you were given until you feel like you can do them without feeling any tightness at the end.   Walking Program Studies show that 30 minutes of walking per day (fast enough to elevate your heart rate) can significantly reduce the risk of a cancer recurrence. If you can't walk due to other medical reasons, we encourage you to find another activity you could do (like a stationary bike or water exercise).  Posture After breast cancer surgery, people frequently sit with rounded shoulders posture because it puts their incisions on slack and feels better. If you sit like this and scar tissue forms in that position, you can become very tight and have pain sitting or standing with good posture. Try to be aware of your posture and sit and stand up tall to heal properly.  Follow up PT: It is recommended you return every 3 months for the first 3 years following surgery to be assessed on the SOZO machine for an L-Dex score. This helps prevent clinically significant lymphedema in 95% of patients. These follow up screens are 10 minute appointments that you are not billed for.  Claris Pong, PT 07/25/2021, 12:14 PM

## 2021-07-25 ENCOUNTER — Encounter (HOSPITAL_COMMUNITY): Payer: Self-pay

## 2021-07-25 ENCOUNTER — Telehealth: Payer: Self-pay | Admitting: Hematology and Oncology

## 2021-07-25 ENCOUNTER — Telehealth: Payer: Self-pay | Admitting: *Deleted

## 2021-07-25 ENCOUNTER — Encounter: Payer: Self-pay | Admitting: *Deleted

## 2021-07-25 ENCOUNTER — Ambulatory Visit: Payer: BC Managed Care – PPO | Attending: Surgery

## 2021-07-25 DIAGNOSIS — Z170 Estrogen receptor positive status [ER+]: Secondary | ICD-10-CM | POA: Insufficient documentation

## 2021-07-25 DIAGNOSIS — R293 Abnormal posture: Secondary | ICD-10-CM | POA: Insufficient documentation

## 2021-07-25 DIAGNOSIS — C50912 Malignant neoplasm of unspecified site of left female breast: Secondary | ICD-10-CM | POA: Diagnosis present

## 2021-07-25 NOTE — Telephone Encounter (Signed)
Received oncotype results of 0/3%. Patient is aware. Patient will be starting xrt closer to home.  Schedule message sent for follow up virtually with Dr. Lindi Adie in August.

## 2021-07-25 NOTE — Telephone Encounter (Signed)
.  Called patient to schedule appointment per 6/30 inbasket, patient is aware of date and time.

## 2021-07-25 NOTE — Patient Instructions (Signed)
     [facility]  [address], [address]  [place] [address]  [phone]  After Breast Cancer Class It is recommended you attend the ABC class to be educated on lymphedema risk reduction. This class is free of charge and lasts for 1 hour. It is a 1-time class. You will need to download the Webex app either on your phone or computer. We will send you a link the night before or the morning of the class. You should be able to click on that link to join the class. This is not a confidential class. You don't have to turn your camera on, but other participants may be able to see your email address.  Scar massage You can begin gentle scar massage to you incision sites. Gently place one hand on the incision and move the skin (without sliding on the skin) in various directions. Do this for a few minutes and then you can gently massage either coconut oil or vitamin E cream into the scars.  Compression garment You should continue wearing your compression bra until you feel like you no longer have swelling.  Home exercise Program Continue doing the exercises you were given until you feel like you can do them without feeling any tightness at the end.   Walking Program Studies show that 30 minutes of walking per day (fast enough to elevate your heart rate) can significantly reduce the risk of a cancer recurrence. If you can't walk due to other medical reasons, we encourage you to find another activity you could do (like a stationary bike or water exercise).  Posture After breast cancer surgery, people frequently sit with rounded shoulders posture because it puts their incisions on slack and feels better. If you sit like this and scar tissue forms in that position, you can become very tight and have pain sitting or standing with good posture. Try to be aware of your posture and sit and stand up tall to heal properly.  Follow up PT: It is recommended you return every 3 months for  the first 2 years following surgery to be assessed on the SOZO machine for an L-Dex score. This helps prevent clinically significant lymphedema in 95% of patients. These follow up screens are 10 minute appointments that you are not billed for.  

## 2021-07-30 ENCOUNTER — Other Ambulatory Visit: Payer: Self-pay | Admitting: Surgery

## 2021-09-15 NOTE — Progress Notes (Signed)
HEMATOLOGY-ONCOLOGY [place] VISIT PROGRESS NOTE  I connected with Catherine Navarro on 09/23/2021 at  2:00 PM EDT by Mychart video conference and verified that I am speaking with the correct person using two identifiers.  I discussed the limitations, risks, security and privacy concerns of performing an evaluation and management service by Webex and the availability of in person appointments.  I also discussed with the patient that there may be a patient responsible charge related to this service. The patient expressed understanding and agreed to proceed.   Patient's Location: Home Physician Location: Clinic  CHIEF COMPLIANT: Discuss post Xrt  INTERVAL HISTORY: Catherine Navarro is a 66 y.o. female with above-mentioned history of left lumpectomy. She presents to the clinic via MyChart to discuss post Xrt.  She has tolerated radiation extremely well in [place].  She wants to discuss the pros and cons of antiestrogen therapy.  She is not very excited about starting an antiestrogen pill.  Oncology History  Malignant neoplasm of upper-outer quadrant of left female breast ([place])  06/20/2021 Initial Diagnosis   Malignant neoplasm of upper-outer quadrant of left female breast ([place])   06/25/2021 Cancer Staging   Staging form: Breast, AJCC 8th Edition - Clinical: Stage IA (cT1b, cN0, cM0, G2, ER+, PR+, HER2: Equivocal) - Signed by Nicholas Lose, MD on 06/25/2021 Stage prefix: Initial diagnosis Histologic grading system: 3 grade system   07/03/2021 Surgery   LEFT BREAST, LUMPECTOMY: pT1b, pN0   07/03/2021 Oncotype testing   Oncotype DX Breast Recurrence Score = 0 (Distant Recurrence Risk at 9 years with AI or TAM Alone = 3%, Group Average Absolute Chemotherapy (CT) Benefit = <1%)    07/17/2021 Genetic Testing   Negative genetic testing on the CancerNext-Expanded+RNAinsight panel.  The report date is July 17, 2021.  The CancerNext-Expanded gene panel offered by [facility] and includes  sequencing and rearrangement analysis for the following 77 genes: AIP, ALK, APC*, ATM*, AXIN2, BAP1, BARD1, BLM, BMPR1A, BRCA1*, BRCA2*, BRIP1*, CDC73, CDH1*, CDK4, CDKN1B, CDKN2A, CHEK2*, CTNNA1, DICER1, FANCC, FH, FLCN, GALNT12, KIF1B, LZTR1, MAX, MEN1, MET, MLH1*, MSH2*, MSH3, MSH6*, MUTYH*, NBN, NF1*, NF2, NTHL1, PALB2*, PHOX2B, PMS2*, POT1, PRKAR1A, PTCH1, PTEN*, RAD51C*, RAD51D*, RB1, RECQL, RET, SDHA, SDHAF2, SDHB, SDHC, SDHD, SMAD4, SMARCA4, SMARCB1, SMARCE1, STK11, SUFU, TMEM127, TP53*, TSC1, TSC2, VHL and XRCC2 (sequencing and deletion/duplication); EGFR, EGLN1, HOXB13, KIT, MITF, PDGFRA, POLD1, and POLE (sequencing only); EPCAM and GREM1 (deletion/duplication only). DNA and RNA analyses performed for * genes.      REVIEW OF SYSTEMS:   Constitutional: Denies fevers, chills or abnormal weight loss   All other systems were reviewed with the patient and are negative.  Observations/Objective:  There were no vitals filed for this visit. There is no height or weight on file to calculate BMI.  I have reviewed the data as listed    Latest Ref Rng & Units 06/25/2021    2:29 PM 12/29/2019    8:18 AM 06/29/2019    9:50 AM  CMP  Glucose 70 - 99 mg/dL 119  103  111   BUN 8 - 23 mg/dL _0 Creatinine 0.44 - 1.00 mg/dL 0.78  0.71  0.87   Sodium 135 - 145 mmol/L 139  143  143   Potassium 3.5 - 5.1 mmol/L 3.6  3.8  4.1   Chloride 98 - 111 mmol/L 105  105  104   CO2 22 - 32 mmol/L _1 Calcium 8.9 - 10.3 mg/dL  9.8  9.9  10.0   Total Protein 6.1 - 8.1 g/dL  6.5  6.9   Total Bilirubin 0.2 - 1.2 mg/dL  0.5  0.5   Alkaline Phos 48 - 121 IU/L   103   AST 10 - 35 U/L  35  30   ALT 6 - 29 U/L  40  38     Lab Results  Component Value Date   WBC 5.5 12/29/2019   HGB 12.8 12/29/2019   HCT 38.6 12/29/2019   MCV 86.4 12/29/2019   PLT 211 12/29/2019   NEUTROABS 2,706 12/29/2019      Assessment Plan:  Malignant neoplasm of upper-outer quadrant of left female breast  ([place]) Screening mammogram detected asymmetry left breast, ultrasound revealed 6 mm irregular mass 10 o'clock position: Biopsy: Invasive mammary cancer grade 2, ER 100%, PR 100%, Ki-67 15%, HER2 equivocal 2+ by IHC, FISH negative   07/03/21: Left Lumpectomy Grade 2 IDC, 0/2 LN Neg, ER 100%, PR 100%, Ki-67 15%, HER2 equivocal 2+ by IHC, FISH negative   Oncotype Dx: Recurrence score 0: Distant recurrence at 9 years: 3%   Recommendation: 1. Adjuvant radiation (she lives 2 hours away and wishes to receive radiation locally closer to her home) at [place]. Completed 09/12/21 3.  Followed by adjuvant antiestrogen therapy with Anastrozole 1 mg daily x5 years to start 09/30/2021   Anastrozole counseling: We discussed the risks and benefits of anti-estrogen therapy with aromatase inhibitors. These include but not limited to insomnia, hot flashes, mood changes, vaginal dryness, bone density loss, and weight gain. We strongly believe that the benefits far outweigh the risks. Patient understands these risks and consented to starting treatment. Planned treatment duration is 5 years.  She is not excited about starting antiestrogen therapy.  RTC in 3 months for survivorship care plan visit (virtual) and to assess the tolerance to Anastrozole   I discussed the assessment and treatment plan with the patient. The patient was provided an opportunity to ask questions and all were answered. The patient agreed with the plan and demonstrated an understanding of the instructions. The patient was advised to call back or seek an in-person evaluation if the symptoms worsen or if the condition fails to improve as anticipated.   I provided 12 minutes of face-to-face Web Ex time during this encounter.    Rulon Eisenmenger, MD 09/23/2021  I Gardiner Coins am scribing for Dr. Lindi Adie  I have reviewed the above documentation for accuracy and completeness, and I agree with the above.

## 2021-09-17 ENCOUNTER — Encounter: Payer: Self-pay | Admitting: *Deleted

## 2021-09-17 DIAGNOSIS — Z170 Estrogen receptor positive status [ER+]: Secondary | ICD-10-CM

## 2021-09-23 ENCOUNTER — Inpatient Hospital Stay: Payer: BC Managed Care – PPO | Attending: Licensed Clinical Social Worker | Admitting: Hematology and Oncology

## 2021-09-23 DIAGNOSIS — C50412 Malignant neoplasm of upper-outer quadrant of left female breast: Secondary | ICD-10-CM | POA: Diagnosis not present

## 2021-09-23 DIAGNOSIS — Z170 Estrogen receptor positive status [ER+]: Secondary | ICD-10-CM | POA: Insufficient documentation

## 2021-09-23 MED ORDER — ANASTROZOLE 1 MG PO TABS: 1.0000 mg | ORAL_TABLET | Freq: Every day | ORAL | 3 refills | Status: DC

## 2021-09-23 NOTE — Assessment & Plan Note (Signed)
Screening mammogram detected asymmetry left breast, ultrasound revealed 6 mm irregular mass 10 o'clock position: Biopsy: Invasive mammary cancer grade 2, ER 100%, PR 100%, Ki-67 15%, HER2 equivocal 2+ by IHC, FISHnegative  07/03/21: Left Lumpectomy Grade 2 IDC, 0/2 LN Neg, ER 100%, PR 100%, Ki-67 15%, HER2 equivocal 2+ by IHC, FISHnegative  Oncotype Dx: Recurrence score 0: Distant recurrence at 9 years: 3%  Recommendation: 1.Adjuvant radiation(she lives 2 hours away and wishes to receive radiation locally closer to her home) at [place]. 3.Followed by adjuvant antiestrogen therapywith letrozole 2.5 mg daily x5 years  RTC in 3 months for survivorship care plan visit and to assess the tolerance to letrozole.

## 2021-10-20 ENCOUNTER — Ambulatory Visit: Payer: BC Managed Care – PPO | Attending: Surgery

## 2021-10-20 VITALS — Wt 243.0 lb

## 2021-10-20 DIAGNOSIS — Z483 Aftercare following surgery for neoplasm: Secondary | ICD-10-CM | POA: Insufficient documentation

## 2021-10-20 NOTE — Therapy (Signed)
OUTPATIENT PHYSICAL THERAPY SOZO SCREENING NOTE   Patient Name: Catherine Navarro MRN: [id number] DOB:Sep 23, 1955, 66 y.o., female Today's Date: 10/20/2021  PCP: Rebecka Apley, MD REFERRING PROVIDER: Coralie Keens, MD   PT End of Session - 10/20/21 1051     Visit Number 1   # unchanged due to screen only   PT Start Time 6945    PT Stop Time 1054    PT Time Calculation (min) 5 min    Activity Tolerance Patient tolerated treatment well    Behavior During Therapy [facility] for tasks assessed/performed             Past Medical History:  Diagnosis Date   Abdominal pain 11/27/2010   Accessory skin tags 01/25/2013   Allergy    Anemia    past hx   Anisocoria    right eye   Cancer ([place])    skin cancer removed from nose    Colon polyps 12/22/2010   Tubular Adenoma and Hyperplastic Polyp   DDD (degenerative disc disease), cervical    Elevated LFTs 11/27/2010   Family history of breast cancer    Family history of colon cancer    Family history of colon cancer    Fatty infiltration of liver    Gastritis    GERD (gastroesophageal reflux disease)    Hiatal hernia    HTN (hypertension)    Hypothyroidism    with nodules   IBS (irritable bowel syndrome)    Muscle spasm of back 06/04/2015   Osteoarthritis    Pancreatitis    Sinusitis    Past Surgical History:  Procedure Laterality Date   ANAL FISTULECTOMY     BREAST LUMPECTOMY WITH RADIOACTIVE SEED AND SENTINEL LYMPH NODE BIOPSY Left 07/03/2021   Procedure: LEFT BREAST LUMPECTOMY WITH RADIOACTIVE SEED AND SENTINEL LYMPH NODE BIOPSY;  Surgeon: Coralie Keens, MD;  Location: [place];  Service: General;  Laterality: Left;  LMA   CHOLECYSTECTOMY     COLONOSCOPY     COLONOSCOPY W/ BIOPSIES     ESOPHAGOGASTRODUODENOSCOPY     EYE SURGERY     PATELLA RECONSTRUCTION     pins   POLYPECTOMY     REPLACEMENT TOTAL KNEE Right 12/2017   TUBAL LIGATION     UPPER GASTROINTESTINAL ENDOSCOPY     Patient  Active Problem List   Diagnosis Date Noted   Genetic testing 07/17/2021   Family history of colon cancer 06/25/2021   Family history of breast cancer 06/25/2021   Malignant neoplasm of upper-outer quadrant of left female breast ([place]) 06/20/2021   Screening for colorectal cancer 02/07/2019   Encounter for gynecological examination with Papanicolaou smear of cervix 02/07/2019   Enlarged thyroid 02/07/2019   Hyperglycemia 12/29/2018   H/O hypokalemia 12/29/2018   Pain in right knee 07/21/2017   Aortic atherosclerosis ([place]) 01/04/2017   Degenerative disc disease, lumbar 01/04/2017   Multinodular goiter 06/17/2016   Muscle spasm of back 06/04/2015   Cutaneous skin tags 04/12/2014   BMI 36.0-36.9,adult 08/01/2013   Anisocoria 07/26/2012   Family history of malignant neoplasm of gastrointestinal tract 12/22/2010   Diverticulosis of colon (without mention of hemorrhage) 12/22/2010   Benign neoplasm of colon 12/22/2010   Fatty liver 11/27/2010   Costochondral chest pain 11/27/2010   GERD (gastroesophageal reflux disease) 03/88/8280   HELICOBACTER PYLORI GASTRITIS, HX OF 05/02/2009   ABDOMINAL PAIN-RUQ 07/12/2007   Essential hypertension 03/23/2007   Allergic rhinitis 03/23/2007   IBS 03/23/2007   Osteoarthritis of right knee  03/23/2007   H/O non anemic vitamin B12 deficiency 03/23/2007   History of acute pancreatitis 03/23/2007   GASTRITIS 12/21/2005   HIATAL HERNIA 12/21/2005    REFERRING DIAG: left breast cancer at risk for lymphedema  THERAPY DIAG: Aftercare following surgery for neoplasm  PERTINENT HISTORY: diagnosed on 06/11/2021 with left Breast Cancer. It measures .6 cm and is located in the 10:00 position of the left breast . It is ER+, PR+ with a Ki67 of 15%. HER 2 is undetermined presently. She is s/p left lumpectomy with SLNB on 07/03/2021  PRECAUTIONS: left UE Lymphedema risk, None  SUBJECTIVE: Pt returns for her first 3 month L-Dex screen.   PAIN:  Are you having  pain? No  SOZO SCREENING: Patient was assessed today using the SOZO machine to determine the lymphedema index score. This was compared to her baseline score. It was determined that she is within the recommended range when compared to her baseline and no further action is needed at this time. She will continue SOZO screenings. These are done every 3 months for 2 years post operatively followed by every 6 months for 2 years, and then annually.   L-DEX FLOWSHEETS - 10/20/21 1000       L-DEX LYMPHEDEMA SCREENING   Measurement Type Unilateral    L-DEX MEASUREMENT EXTREMITY Upper Extremity    POSITION  Standing    DOMINANT SIDE Left    At Risk Side Left    BASELINE SCORE (UNILATERAL) 0.1    L-DEX SCORE (UNILATERAL) 1.7    VALUE CHANGE (UNILAT) 1.6              Otelia Limes, PTA 10/20/2021, 10:59 AM

## 2021-10-23 ENCOUNTER — Encounter: Payer: Self-pay | Admitting: *Deleted

## 2022-01-09 ENCOUNTER — Encounter: Payer: Self-pay | Admitting: Rehabilitation

## 2022-01-09 ENCOUNTER — Ambulatory Visit: Payer: BC Managed Care – PPO | Attending: Surgery | Admitting: Rehabilitation

## 2022-01-09 DIAGNOSIS — R293 Abnormal posture: Secondary | ICD-10-CM | POA: Insufficient documentation

## 2022-01-09 DIAGNOSIS — Z170 Estrogen receptor positive status [ER+]: Secondary | ICD-10-CM | POA: Insufficient documentation

## 2022-01-09 DIAGNOSIS — C50912 Malignant neoplasm of unspecified site of left female breast: Secondary | ICD-10-CM | POA: Insufficient documentation

## 2022-01-09 DIAGNOSIS — Z483 Aftercare following surgery for neoplasm: Secondary | ICD-10-CM | POA: Insufficient documentation

## 2022-01-09 NOTE — Therapy (Signed)
OUTPATIENT PHYSICAL THERAPY SOZO SCREENING NOTE   Patient Name: Catherine Navarro MRN: [id number] DOB:1956-01-20, 66 y.o., female Today's Date: 01/09/2022  PCP: Rebecka Apley, MD REFERRING PROVIDER: Coralie Keens, MD   PT End of Session - 01/09/22 1038     Visit Number 1   screen only   PT Start Time 53    PT Stop Time 1050    PT Time Calculation (min) 10 min    Activity Tolerance Patient tolerated treatment well    Behavior During Therapy [facility] for tasks assessed/performed             Past Medical History:  Diagnosis Date   Abdominal pain 11/27/2010   Accessory skin tags 01/25/2013   Allergy    Anemia    past hx   Anisocoria    right eye   Cancer ([place])    skin cancer removed from nose    Colon polyps 12/22/2010   Tubular Adenoma and Hyperplastic Polyp   DDD (degenerative disc disease), cervical    Elevated LFTs 11/27/2010   Family history of breast cancer    Family history of colon cancer    Family history of colon cancer    Fatty infiltration of liver    Gastritis    GERD (gastroesophageal reflux disease)    Hiatal hernia    HTN (hypertension)    Hypothyroidism    with nodules   IBS (irritable bowel syndrome)    Muscle spasm of back 06/04/2015   Osteoarthritis    Pancreatitis    Sinusitis    Past Surgical History:  Procedure Laterality Date   ANAL FISTULECTOMY     BREAST LUMPECTOMY WITH RADIOACTIVE SEED AND SENTINEL LYMPH NODE BIOPSY Left 07/03/2021   Procedure: LEFT BREAST LUMPECTOMY WITH RADIOACTIVE SEED AND SENTINEL LYMPH NODE BIOPSY;  Surgeon: Coralie Keens, MD;  Location: [place];  Service: General;  Laterality: Left;  LMA   CHOLECYSTECTOMY     COLONOSCOPY     COLONOSCOPY W/ BIOPSIES     ESOPHAGOGASTRODUODENOSCOPY     EYE SURGERY     PATELLA RECONSTRUCTION     pins   POLYPECTOMY     REPLACEMENT TOTAL KNEE Right 12/2017   TUBAL LIGATION     UPPER GASTROINTESTINAL ENDOSCOPY     Patient Active Problem  List   Diagnosis Date Noted   Genetic testing 07/17/2021   Family history of colon cancer 06/25/2021   Family history of breast cancer 06/25/2021   Malignant neoplasm of upper-outer quadrant of left female breast ([place]) 06/20/2021   Screening for colorectal cancer 02/07/2019   Encounter for gynecological examination with Papanicolaou smear of cervix 02/07/2019   Enlarged thyroid 02/07/2019   Hyperglycemia 12/29/2018   H/O hypokalemia 12/29/2018   Pain in right knee 07/21/2017   Aortic atherosclerosis ([place]) 01/04/2017   Degenerative disc disease, lumbar 01/04/2017   Multinodular goiter 06/17/2016   Muscle spasm of back 06/04/2015   Cutaneous skin tags 04/12/2014   BMI 36.0-36.9,adult 08/01/2013   Anisocoria 07/26/2012   Family history of malignant neoplasm of gastrointestinal tract 12/22/2010   Diverticulosis of colon (without mention of hemorrhage) 12/22/2010   Benign neoplasm of colon 12/22/2010   Fatty liver 11/27/2010   Costochondral chest pain 11/27/2010   GERD (gastroesophageal reflux disease) 92/42/6834   HELICOBACTER PYLORI GASTRITIS, HX OF 05/02/2009   ABDOMINAL PAIN-RUQ 07/12/2007   Essential hypertension 03/23/2007   Allergic rhinitis 03/23/2007   IBS 03/23/2007   Osteoarthritis of right knee 03/23/2007   H/O  non anemic vitamin B12 deficiency 03/23/2007   History of acute pancreatitis 03/23/2007   GASTRITIS 12/21/2005   HIATAL HERNIA 12/21/2005    REFERRING DIAG: left breast cancer at risk for lymphedema  THERAPY DIAG: Aftercare following surgery for neoplasm  Malignant neoplasm of left breast in female, estrogen receptor positive, unspecified site of breast ([place])  Abnormal posture  PERTINENT HISTORY: diagnosed on 06/11/2021 with left Breast Cancer. It measures .6 cm and is located in the 10:00 position of the left breast . It is ER+, PR+ with a Ki67 of 15%. HER 2 is undetermined presently. She is s/p left lumpectomy with SLNB on 07/03/2021  PRECAUTIONS: left UE  Lymphedema risk, None  SUBJECTIVE: Pt returns for her first 3 month L-Dex screen.   PAIN:  Are you having pain? No  SOZO SCREENING: Patient was assessed today using the SOZO machine to determine the lymphedema index score. This was compared to her baseline score. It was determined that she is within the recommended range when compared to her baseline and no further action is needed at this time. She will continue SOZO screenings. These are done every 3 months for 2 years post operatively followed by every 6 months for 2 years, and then annually.   L-DEX FLOWSHEETS - 01/09/22 1000       L-DEX LYMPHEDEMA SCREENING   Measurement Type Unilateral    L-DEX MEASUREMENT EXTREMITY Upper Extremity    POSITION  Standing    DOMINANT SIDE Left    At Risk Side Left    BASELINE SCORE (UNILATERAL) 0.1    L-DEX SCORE (UNILATERAL) 1.4    VALUE CHANGE (UNILAT) 1.3             Called [facility] in [place] and they were available to transfer her screenings with a referral stating so, so Dawn was sent a note requesting a referall and pt will let us know when this gets switched over.    Stark Bray, PT 01/09/2022, 11:00 AM

## 2022-02-02 ENCOUNTER — Inpatient Hospital Stay: Payer: BC Managed Care – PPO | Attending: Adult Health | Admitting: Adult Health

## 2022-02-02 DIAGNOSIS — C50412 Malignant neoplasm of upper-outer quadrant of left female breast: Secondary | ICD-10-CM | POA: Diagnosis not present

## 2022-02-02 DIAGNOSIS — Z170 Estrogen receptor positive status [ER+]: Secondary | ICD-10-CM | POA: Diagnosis not present

## 2022-02-02 NOTE — Progress Notes (Signed)
SURVIVORSHIP VIRTUAL VISIT:  I connected with Madelaine Etienne on 02/03/22 at 11:15 AM EST by my chart video and verified that I am speaking with the correct person using two identifiers.  I discussed the limitations, risks, security and privacy concerns of performing an evaluation and management service by telephone and the availability of in person appointments. I also discussed with the patient that there may be a patient responsible charge related to this service. The patient expressed understanding and agreed to proceed.   Patient location: home in [place], [place] Provider location: [facility] provider office  BRIEF ONCOLOGIC HISTORY:  Oncology History  Malignant neoplasm of upper-outer quadrant of left female breast (HCC)  06/20/2021 Initial Diagnosis   Malignant neoplasm of upper-outer quadrant of left female breast (HCC)   06/25/2021 Cancer Staging   Staging form: Breast, AJCC 8th Edition - Clinical: Stage IA (cT1b, cN0, cM0, G2, ER+, PR+, HER2: Equivocal) - Signed by Serena Croissant, MD on 06/25/2021 Stage prefix: Initial diagnosis Histologic grading system: 3 grade system   07/03/2021 Surgery   LEFT BREAST, LUMPECTOMY: pT1b, pN0   07/03/2021 Oncotype testing   Oncotype DX Breast Recurrence Score = 0 (Distant Recurrence Risk at 9 years with AI or TAM Alone = 3%, Group Average Absolute Chemotherapy (CT) Benefit = <1%)    07/17/2021 Genetic Testing   Negative genetic testing on the CancerNext-Expanded+RNAinsight panel.  The report date is July 17, 2021.  The CancerNext-Expanded gene panel offered by [facility] and includes sequencing and rearrangement analysis for the following 77 genes: AIP, ALK, APC*, ATM*, AXIN2, BAP1, BARD1, BLM, BMPR1A, BRCA1*, BRCA2*, BRIP1*, CDC73, CDH1*, CDK4, CDKN1B, CDKN2A, CHEK2*, CTNNA1, DICER1, FANCC, FH, FLCN, GALNT12, KIF1B, LZTR1, MAX, MEN1, MET, MLH1*, MSH2*, MSH3, MSH6*, MUTYH*, NBN, NF1*, NF2, NTHL1, PALB2*, PHOX2B, PMS2*, POT1, PRKAR1A, PTCH1, PTEN*, RAD51C*,  RAD51D*, RB1, RECQL, RET, SDHA, SDHAF2, SDHB, SDHC, SDHD, SMAD4, SMARCA4, SMARCB1, SMARCE1, STK11, SUFU, TMEM127, TP53*, TSC1, TSC2, VHL and XRCC2 (sequencing and deletion/duplication); EGFR, EGLN1, HOXB13, KIT, MITF, PDGFRA, POLD1, and POLE (sequencing only); EPCAM and GREM1 (deletion/duplication only). DNA and RNA analyses performed for * genes.      INTERVAL HISTORY:  Ms. Toalson to review her survivorship care plan detailing her treatment course for breast cancer, as well as monitoring long-term side effects of that treatment, education regarding health maintenance, screening, and overall wellness and health promotion.     Overall, Ms. Winkowski reports feeling quite well.  She is taking anastrozole daily and tolerates it well with the exception of some mild irritability, hot flashes, and arthralgias, however these are manageable for her.    REVIEW OF SYSTEMS:  Review of Systems  Constitutional:  Positive for fatigue. Negative for appetite change, chills, fever and unexpected weight change.  HENT:   Negative for hearing loss, lump/mass and trouble swallowing.   Eyes:  Negative for eye problems and icterus.  Respiratory:  Negative for chest tightness, cough and shortness of breath.   Cardiovascular:  Negative for chest pain, leg swelling and palpitations.  Gastrointestinal:  Negative for abdominal distention, abdominal pain, constipation, diarrhea, nausea and vomiting.  Endocrine: Negative for hot flashes.  Genitourinary:  Negative for difficulty urinating.   Musculoskeletal:  Negative for arthralgias.  Skin:  Negative for itching and rash.  Neurological:  Negative for dizziness, extremity weakness, headaches and numbness.  Hematological:  Negative for adenopathy. Does not bruise/bleed easily.  Psychiatric/Behavioral:  Negative for depression. The patient is not nervous/anxious.   Breast: Denies any new nodularity, masses, tenderness, nipple changes, or nipple  discharge.     PAST  MEDICAL/SURGICAL HISTORY:  Past Medical History:  Diagnosis Date   Abdominal pain 11/27/2010   Accessory skin tags 01/25/2013   Allergy    Anemia    past hx   Anisocoria    right eye   Cancer (HCC)    skin cancer removed from nose    Colon polyps 12/22/2010   Tubular Adenoma and Hyperplastic Polyp   DDD (degenerative disc disease), cervical    Elevated LFTs 11/27/2010   Family history of breast cancer    Family history of colon cancer    Family history of colon cancer    Fatty infiltration of liver    Gastritis    GERD (gastroesophageal reflux disease)    Hiatal hernia    HTN (hypertension)    Hypothyroidism    with nodules   IBS (irritable bowel syndrome)    Muscle spasm of back 06/04/2015   Osteoarthritis    Pancreatitis    Sinusitis    Past Surgical History:  Procedure Laterality Date   ANAL FISTULECTOMY     BREAST LUMPECTOMY WITH RADIOACTIVE SEED AND SENTINEL LYMPH NODE BIOPSY Left 07/03/2021   Procedure: LEFT BREAST LUMPECTOMY WITH RADIOACTIVE SEED AND SENTINEL LYMPH NODE BIOPSY;  Surgeon: Abigail Miyamoto, MD;  Location: [facility];  Service: General;  Laterality: Left;  LMA   CHOLECYSTECTOMY     COLONOSCOPY     COLONOSCOPY W/ BIOPSIES     ESOPHAGOGASTRODUODENOSCOPY     EYE SURGERY     PATELLA RECONSTRUCTION     pins   POLYPECTOMY     REPLACEMENT TOTAL KNEE Right 12/2017   TUBAL LIGATION     UPPER GASTROINTESTINAL ENDOSCOPY       ALLERGIES:  Allergies  Allergen Reactions   Amoxicillin     rash   Codeine     REACTION: headache   Famotidine Nausea Only     CURRENT MEDICATIONS:  Outpatient Encounter Medications as of 02/02/2022  Medication Sig Note   anastrozole (ARIMIDEX) 1 MG tablet Take 1 tablet (1 mg total) by mouth daily.    Ascorbic Acid (VITAMIN C) 100 MG tablet Take 100 mg by mouth daily.    aspirin 81 MG tablet Take 81 mg by mouth daily. 09/25/2014: Per patient every other day   Calcium Carbonate-Vitamin D (CALCIUM-D) 600-400  MG-UNIT TABS Take by mouth every other day.     clindamycin (CLEOCIN) 300 MG capsule clindamycin HCl 300 mg capsule  TAKE 3 CAPSULES 1 HOUR PRIOR TO DENTAL WORK    fish oil-omega-3 fatty acids 1000 MG capsule Take 1 g by mouth every other day.    fluticasone (FLONASE) 50 MCG/ACT nasal spray Place 2 sprays into both nostrils daily. (Patient taking differently: Place 2 sprays into both nostrils as needed.)    Garlic 400 MG TABS Take by mouth. 1000 mg daily    loratadine (CLARITIN REDITABS) 10 MG dissolvable tablet Take 10 mg by mouth daily. 09/25/2014: Per patient take regular tablet as needed   meloxicam (MOBIC) 15 MG tablet Take 1 tablet (15 mg total) by mouth daily as needed for pain. Take 1 tablet by mouth  daily    Multiple Vitamin (MULTIVITAMIN PO) Take by mouth 1 day or 1 dose.    pantoprazole (PROTONIX) 40 MG tablet Take 1 tablet by mouth two  times daily    potassium chloride SA (KLOR-CON) 20 MEQ tablet Take 1 tablet (20 mEq total) by mouth daily.    propranolol ER (  INDERAL LA) 80 MG 24 hr capsule TAKE 1 CAPSULE BY MOUTH EVERY DAY (Patient taking differently: at bedtime. TAKE 1 CAPSULE BY MOUTH EVERY DAY)    pyridOXINE (VITAMIN B-6) 100 MG tablet Take 100 mg by mouth daily.     traMADol (ULTRAM) 50 MG tablet Take 1 tablet (50 mg total) by mouth every 6 (six) hours as needed.    valsartan-hydrochlorothiazide (DIOVAN-HCT) 80-12.5 MG tablet Take 1 tablet by mouth daily.    vitamin B-12 (CYANOCOBALAMIN) 1000 MCG tablet Take 1,000 mcg by mouth daily.    Zinc 100 MG TABS Take by mouth.    Facility-Administered Encounter Medications as of 02/02/2022  Medication   0.9 %  sodium chloride infusion     ONCOLOGIC FAMILY HISTORY:  Family History  Problem Relation Age of Onset   Diabetes Mother    Irritable bowel syndrome Mother    Stroke Mother    COPD Father        emphysema   Cancer Father    Colon cancer Sister    Liver cancer Sister    Non-Hodgkin's lymphoma Sister    Hyperlipidemia  Sister    Hypertension Sister    Colon polyps Sister    Diabetes Sister    Liver disease Sister        fatty liver, cirrhosis   Colon polyps Sister    Kartagener's syndrome Sister    Cancer Brother 78       colon-colorectal colon cancer   Colon cancer Brother    Rectal cancer Brother    Hyperlipidemia Brother        triglycerides   Hypertension Brother    Colon polyps Brother    Breast cancer Maternal Aunt 53   Alzheimer's disease Maternal Aunt    Non-Hodgkin's lymphoma Paternal Uncle    Diabetes Maternal Grandmother    Alzheimer's disease Paternal Grandmother    Heart attack Paternal Grandfather    Heart attack Son 85       rare genetic condition   Non-Hodgkin's lymphoma Niece    Heart disease Neg Hx    Esophageal cancer Neg Hx    Stomach cancer Neg Hx      SOCIAL HISTORY:  Social History   Socioeconomic History   Marital status: Married    Spouse name: BF: Charlann Noss   Number of children: 3   Years of education: 12+   Highest education level: Not on file  Occupational History   Occupation: Adult nurse: OTHER  Tobacco Use   Smoking status: Former    Types: Cigarettes    Quit date: 01/26/1998    Years since quitting: 24.0    Passive exposure: Past   Smokeless tobacco: Never  Vaping Use   Vaping Use: Never used  Substance and Sexual Activity   Alcohol use: No   Drug use: No   Sexual activity: Not Currently    Birth control/protection: Post-menopausal, Surgical    Comment: tubal  Other Topics Concern   Not on file  Social History Narrative   Lives alone.Her ex-husband died of heart attack at age 47. 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   One son lives next door, one in [place], [place] and another in [place].   Social Determinants of Health   Financial Resource Strain: Low Risk  (06/30/2021)   Overall Financial Resource Strain (CARDIA)    Difficulty of Paying Living Expenses:  Not very hard  Food Insecurity: No  Food Insecurity (06/30/2021)   Hunger Vital Sign    Worried About Running Out of Food in the Last Year: Never true    Ran Out of Food in the Last Year: Never true  Transportation Needs: No Transportation Needs (06/30/2021)   PRAPARE - Administrator, Civil Service (Medical): No    Lack of Transportation (Non-Medical): No  Physical Activity: Not on file  Stress: Not on file  Social Connections: Not on file  Intimate Partner Violence: Not on file     OBSERVATIONS/OBJECTIVE:   The patient appears well she is in no apparent distress her breathing is nonlabored and skin visualized is without rash or lesion.  LABORATORY DATA:  None for this visit.  DIAGNOSTIC IMAGING:  None for this visit.      ASSESSMENT AND PLAN:  Ms.. Vitorino is a pleasant 67 y.o. female with Stage IA left breast invasive ductal carcinoma, ER+/PR+/HER2-, diagnosed in 05/2021, treated with lumpectomy, adjuvant radiation therapy, and anti-estrogen therapy with Anastrozole beginning in 09/2021.  She presents to the Survivorship Clinic for our initial meeting and routine follow-up post-completion of treatment for breast cancer.    1. Stage IA left breast cancer:  Ms. Burch is continuing to recover from definitive treatment for breast cancer. She will follow-up with her medical oncologist, Dr. Pamelia Hoit in 6 months with history and physical exam per surveillance protocol.  She will continue her anti-estrogen therapy with Anastrozole. Thus far, she is tolerating the Anastrozole well, with minimal & tolerable side effects. Her mammogram is due 04/2021; orders placed today.   Today, a comprehensive survivorship care plan and treatment summary was reviewed with the patient today detailing her breast cancer diagnosis, treatment course, potential late/long-term effects of treatment, appropriate follow-up care with recommendations for the future, and patient education resources.  A copy of this  summary, along with a letter will be sent to the patient's primary care provider via mail/fax/In Basket message after today's visit.    2. Bone health:  Given Ms. Oleksy's age/history of breast cancer and her current treatment regimen including anti-estrogen therapy with Anastrozole, she is at risk for bone demineralization.  Her last DEXA scan was 09/04/2020 and was normal.  I recommended repeat testing in 08/2022 and that she have this completed where she had her initial DEXA testing performed which was ordered by her PCP.   She was given education on specific activities to promote bone health.  3. Cancer screening:  Due to Ms. Age's history and her age, she should receive screening for skin cancers, colon cancer, and gynecologic cancers.  The information and recommendations are listed on the patient's comprehensive care plan/treatment summary and were reviewed in detail with the patient.    4. Health maintenance and wellness promotion: Ms. Kaszynski was encouraged to consume 5-7 servings of fruits and vegetables per day. We reviewed the "Nutrition Rainbow" handout.  She was also encouraged to engage in moderate to vigorous exercise for 30 minutes per day most days of the week. We discussed the [employer] fitness program, which is designed for cancer survivors to help them become more physically fit after cancer treatments.  She was instructed to limit her alcohol consumption and continue to abstain from tobacco use.     5. Support services/counseling: It is not uncommon for this period of the patient's cancer care trajectory to be one of many emotions and stressors.  She was given information regarding our available services and encouraged to contact me with any questions  or for help enrolling in any of our support group/programs.    Follow up instructions:    -Virtual f/u with Dr. Pamelia Hoit in 6 months  -Mammogram due in 04/2021 -Bone density in 08/2022 -Follow up with surgery 01/2022 -She is welcome  to return back to the Survivorship Clinic at any time; no additional follow-up needed at this time.  -Consider referral back to survivorship as a long-term survivor for continued surveillance  The patient was provided an opportunity to ask questions and all were answered. The patient agreed with the plan and demonstrated an understanding of the instructions.   The patient was advised to call back or seek an in-person evaluation if the symptoms worsen or if the condition fails to improve as anticipated.   I provided 30 minutes of face-to-face video visit time during this encounter, and > 50% was spent counseling as documented under my assessment & plan.  Lillard Anes, NP 02/03/22 10:04 AM Medical Oncology and Hematology [facility] [address] [place], [place] [address] Tel. [phone]    Fax. [phone]

## 2022-02-03 ENCOUNTER — Encounter: Payer: Self-pay | Admitting: Adult Health

## 2022-02-18 ENCOUNTER — Encounter: Payer: Self-pay | Admitting: Internal Medicine

## 2022-03-09 ENCOUNTER — Other Ambulatory Visit (HOSPITAL_COMMUNITY)
Admission: RE | Admit: 2022-03-09 | Discharge: 2022-03-09 | Disposition: A | Discharge status: Home / Self Care | Payer: BC Managed Care – PPO | Source: Ambulatory Visit | Attending: Adult Health | Admitting: Adult Health

## 2022-03-09 ENCOUNTER — Ambulatory Visit (INDEPENDENT_AMBULATORY_CARE_PROVIDER_SITE_OTHER): Payer: BC Managed Care – PPO | Admitting: Adult Health

## 2022-03-09 ENCOUNTER — Encounter: Payer: Self-pay | Admitting: Adult Health

## 2022-03-09 VITALS — BP 173/83 | HR 64 | Ht 66.0 in | Wt 249.5 lb

## 2022-03-09 DIAGNOSIS — Z853 Personal history of malignant neoplasm of breast: Secondary | ICD-10-CM

## 2022-03-09 DIAGNOSIS — Z1211 Encounter for screening for malignant neoplasm of colon: Secondary | ICD-10-CM | POA: Diagnosis not present

## 2022-03-09 DIAGNOSIS — B369 Superficial mycosis, unspecified: Secondary | ICD-10-CM | POA: Diagnosis not present

## 2022-03-09 DIAGNOSIS — E042 Nontoxic multinodular goiter: Secondary | ICD-10-CM | POA: Diagnosis not present

## 2022-03-09 DIAGNOSIS — Z01419 Encounter for gynecological examination (general) (routine) without abnormal findings: Secondary | ICD-10-CM | POA: Diagnosis present

## 2022-03-09 LAB — HEMOCCULT GUIAC POC 1CARD (OFFICE): Fecal Occult Blood, POC: NEGATIVE

## 2022-03-09 MED ORDER — NYSTATIN 100000 UNIT/GM EX POWD: 1.0000 | Freq: Two times a day (BID) | CUTANEOUS | 2 refills | Status: AC

## 2022-03-09 NOTE — Progress Notes (Signed)
Patient ID: Catherine Navarro, female   DOB: [birth year], 67 y.o.   MRN: [id number] History of Present Illness: Catherine Navarro is a 67 year old white female, married, PM in for well woman gyn exam and pap. She has had breast cancer and is on Arimidex. She is living in [place] but building a house in [place].  PCP is Dr Raeford Razor   Current Medications, Allergies, Past Medical History, Past Surgical History, Family History and Social History were reviewed in [place] record.     Review of Systems: Patient denies any headaches, hearing loss, fatigue, blurred vision, shortness of breath, chest pain, abdominal pain, problems with bowel movements, urination, or intercourse. No joint pain or mood swings.     Physical Exam:BP (!) 173/83 (BP Location: Right Arm, Patient Position: Sitting, Cuff Size: Normal)   Pulse 64   Ht 5' 6"$  (1.676 m)   Wt 249 lb 8 oz (113.2 kg)   BMI 40.27 kg/m   General:  Well developed, well nourished, no acute distress Skin:  Warm and dry Neck:  Midline trachea, enlarged thyroid(has known goiter), good ROM, no lymphadenopathy,no carotid bruits heard Lungs; Clear to auscultation bilaterally Breast:  No dominant palpable mass, retraction, or nipple discharge Cardiovascular: Regular rate and rhythm Abdomen:  Soft, non tender, no hepatosplenomegaly Pelvic:  External genitalia is normal in appearance, mild skin fungus right groin area.  The vagina is pale. Urethra has no lesions or masses. The cervix is smooth, pap with HR HPV genotyping performed.  Uterus is felt to be normal size, shape, and contour.  No adnexal masses or tenderness noted.Bladder is non tender, no masses felt. Rectal: Good sphincter tone, no polyps, or hemorrhoids felt.  Hemoccult negative. Extremities/musculoskeletal:  No swelling or varicosities noted, no clubbing or cyanosis Psych:  No mood changes, alert and cooperative,seems happy AA is 0 Fall risk is low    03/09/2022    11:38 AM 12/26/2019    9:36 AM 06/29/2019    9:13 AM  Depression screen PHQ 2/9  Decreased Interest 0 0 0  Down, Depressed, Hopeless 0 0 0  PHQ - 2 Score 0 0 0  Altered sleeping 0 0   Tired, decreased energy 0 0   Change in appetite 0 0   Feeling bad or failure about yourself  0 0   Trouble concentrating 0 0   Moving slowly or fidgety/restless 0 0   Suicidal thoughts 0 0   PHQ-9 Score 0 0   Difficult doing work/chores  Not difficult at all        03/09/2022   11:39 AM  GAD 7 : Generalized Anxiety Score  Nervous, Anxious, on Edge 0  Control/stop worrying 0  Worry too much - different things 0  Trouble relaxing 0  Restless 0  Easily annoyed or irritable 0  Afraid - awful might happen 0  Total GAD 7 Score 0      Upstream - 03/09/22 1158       Pregnancy Intention Screening   Does the patient want to become pregnant in the next year? N/A    Does the patient's partner want to become pregnant in the next year? N/A    Would the patient like to discuss contraceptive options today? N/A      Contraception Wrap Up   Current Method Female Sterilization    End Method Female Sterilization            Examination chaperoned by Levy Pupa LPN  Impression and Plan: 1. Encounter for gynecological examination with Papanicolaou smear of cervix Pap sent Pap in 3 years if normal Physical with PCP Labs with PCP Mammogram as advised Colonoscopy in near future with Maryanna Shape   2. Encounter for screening fecal occult blood testing Hemoccult was negative   3. Superficial fungus infection of skin Will rx nystatin powder Meds ordered this encounter  Medications   nystatin (MYCOSTATIN/NYSTOP) powder    Sig: Apply 1 Application topically 2 (two) times daily.    Dispense:  60 g    Refill:  2    Order Specific Question:   Supervising Provider    Answer:   EURE, LUTHER H [[id number]]      4. Multinodular goiter Stable   5. History of breast cancer On Arimidex

## 2022-03-10 ENCOUNTER — Ambulatory Visit (AMBULATORY_SURGERY_CENTER): Payer: Medicare Other | Admitting: *Deleted

## 2022-03-10 VITALS — Ht 66.0 in | Wt 249.0 lb

## 2022-03-10 DIAGNOSIS — Z86010 Personal history of colonic polyps: Secondary | ICD-10-CM

## 2022-03-10 DIAGNOSIS — Z85038 Personal history of other malignant neoplasm of large intestine: Secondary | ICD-10-CM

## 2022-03-10 MED ORDER — NA SULFATE-K SULFATE-MG SULF 17.5-3.13-1.6 GM/177ML PO SOLN: 1.0000 | Freq: Once | ORAL | 0 refills | Status: AC

## 2022-03-10 NOTE — Progress Notes (Signed)
No egg or soy allergy known to patient  No issues known to pt with past sedation with any surgeries or procedures Patient denies ever being told they had issues or difficulty with intubation  No FH of Malignant Hyperthermia Pt is not on diet pills Pt is not on  home 02  Pt is not on blood thinners  Pt denies issues with constipation  Pt is not on dialysis Pt denies any upcoming cardiac testing Pt encouraged to use to use Singlecare or Goodrx to reduce cost Patient's chart reviewed by Osvaldo Angst CNRA prior to previsit and patient appropriate for the [facility].  Previsit completed and red dot placed by patient's name on their procedure day (on provider's schedule).  . Weight per PT Visit by phone Instructions reviewed with pt and pt states understanding. Instructed to review again prior to procedure. Pt states they will.  Instructions sent by mail

## 2022-03-11 LAB — CYTOLOGY - PAP
Comment: NEGATIVE
Diagnosis: NEGATIVE
High risk HPV: NEGATIVE

## 2022-03-25 ENCOUNTER — Encounter: Payer: Self-pay | Admitting: Internal Medicine

## 2022-04-13 ENCOUNTER — Encounter: Payer: Self-pay | Admitting: Internal Medicine

## 2022-04-13 ENCOUNTER — Ambulatory Visit (AMBULATORY_SURGERY_CENTER): Payer: Medicare Other | Admitting: Internal Medicine

## 2022-04-13 VITALS — BP 141/54 | HR 111 | Temp 98.7°F | Resp 19 | Ht 66.0 in | Wt 249.0 lb

## 2022-04-13 DIAGNOSIS — Z85038 Personal history of other malignant neoplasm of large intestine: Secondary | ICD-10-CM

## 2022-04-13 DIAGNOSIS — D123 Benign neoplasm of transverse colon: Secondary | ICD-10-CM | POA: Diagnosis not present

## 2022-04-13 DIAGNOSIS — D124 Benign neoplasm of descending colon: Secondary | ICD-10-CM | POA: Diagnosis not present

## 2022-04-13 DIAGNOSIS — Z86010 Personal history of colonic polyps: Secondary | ICD-10-CM

## 2022-04-13 DIAGNOSIS — Z08 Encounter for follow-up examination after completed treatment for malignant neoplasm: Secondary | ICD-10-CM

## 2022-04-13 MED ORDER — SODIUM CHLORIDE 0.9 % IV SOLN: 500.0000 mL | Freq: Once | INTRAVENOUS | Status: DC

## 2022-04-13 NOTE — Progress Notes (Signed)
Called to room to assist during endoscopic procedure.  Patient ID and intended procedure confirmed with present staff. Received instructions for my participation in the procedure from the performing physician.  

## 2022-04-13 NOTE — Patient Instructions (Signed)
Information on polyps, diverticulosis and hemorrhoids given to you today.  Await pathology results.  Repeat colonoscopy in 5 years for surveillance.  Continue present diet and medications.    YOU HAD AN ENDOSCOPIC PROCEDURE TODAY AT [facility]:   Refer to the procedure report that was given to you for any specific questions about what was found during the examination.  If the procedure report does not answer your questions, please call your gastroenterologist to clarify.  If you requested that your care partner not be given the details of your procedure findings, then the procedure report has been included in a sealed envelope for you to review at your convenience later.  YOU SHOULD EXPECT: Some feelings of bloating in the abdomen. Passage of more gas than usual.  Walking can help get rid of the air that was put into your GI tract during the procedure and reduce the bloating. If you had a lower endoscopy (such as a colonoscopy or flexible sigmoidoscopy) you may notice spotting of blood in your stool or on the toilet paper. If you underwent a bowel prep for your procedure, you may not have a normal bowel movement for a few days.  Please Note:  You might notice some irritation and congestion in your nose or some drainage.  This is from the oxygen used during your procedure.  There is no need for concern and it should clear up in a day or so.  SYMPTOMS TO REPORT IMMEDIATELY:  Following lower endoscopy (colonoscopy or flexible sigmoidoscopy):  Excessive amounts of blood in the stool  Significant tenderness or worsening of abdominal pains  Swelling of the abdomen that is new, acute  Fever of 100F or higher   For urgent or emergent issues, a gastroenterologist can be reached at any hour by calling [phone]. Do not use MyChart messaging for urgent concerns.    DIET:  We do recommend a small meal at first, but then you may proceed to your regular diet.  Drink plenty of  fluids but you should avoid alcoholic beverages for 24 hours.  ACTIVITY:  You should plan to take it easy for the rest of today and you should NOT DRIVE or use heavy machinery until tomorrow (because of the sedation medicines used during the test).    FOLLOW UP: Our staff will call the number listed on your records the next business day following your procedure.  We will call around 7:15- 8:00 am to check on you and address any questions or concerns that you may have regarding the information given to you following your procedure. If we do not reach you, we will leave a message.     If any biopsies were taken you will be contacted by phone or by letter within the next 1-3 weeks.  Please call us at [phone] if you have not heard about the biopsies in 3 weeks.    SIGNATURES/CONFIDENTIALITY: You and/or your care partner have signed paperwork which will be entered into your electronic medical record.  These signatures attest to the fact that that the information above on your After Visit Summary has been reviewed and is understood.  Full responsibility of the confidentiality of this discharge information lies with you and/or your care-partner.

## 2022-04-13 NOTE — Op Note (Signed)
[facility] Patient Name: Catherine Navarro Procedure Date: 04/13/2022 11:28 AM MRN: [id number] Endoscopist: Jerene Bears , MD, [id number] Age: 67 Referring MD:  Date of Birth: [birth year] Gender: Female Account #: [id number] Procedure:                Colonoscopy Indications:              High risk colon cancer surveillance: Personal                            history of multiple adenomas, Family history of                            colon cancer in multiple first-degree relatives,                            Last colonoscopy: February 2021 (8 TAs) Medicines:                Monitored Anesthesia Care Procedure:                Pre-Anesthesia Assessment:                           - Prior to the procedure, a History and Physical                            was performed, and patient medications and                            allergies were reviewed. The patient's tolerance of                            previous anesthesia was also reviewed. The risks                            and benefits of the procedure and the sedation                            options and risks were discussed with the patient.                            All questions were answered, and informed consent                            was obtained. Prior Anticoagulants: The patient has                            taken no anticoagulant or antiplatelet agents. ASA                            Grade Assessment: III - A patient with severe                            systemic disease. After reviewing the risks and  benefits, the patient was deemed in satisfactory                            condition to undergo the procedure.                           After obtaining informed consent, the colonoscope                            was passed under direct vision. Throughout the                            procedure, the patient's blood pressure, pulse, and                            oxygen saturations were  monitored continuously. The                            CF [id number] was introduced through the anus                            and advanced to the cecum, identified by the                            appendiceal orifice. The colonoscopy was performed                            without difficulty. The patient tolerated the                            procedure well. The quality of the bowel                            preparation was good. The ileocecal valve,                            appendiceal orifice, and rectum were photographed. Scope In: 11:41:47 AM Scope Out: 11:57:01 AM Scope Withdrawal Time: 0 hours 11 minutes 42 seconds  Total Procedure Duration: 0 hours 15 minutes 14 seconds  Findings:                 The digital rectal exam was normal.                           There was a medium-sized lipoma, in the ascending                            colon.                           A 4 mm polyp was found in the transverse colon. The                            polyp was sessile. The polyp was removed with a  cold snare. Resection and retrieval were complete.                           A 5 mm polyp was found in the descending colon. The                            polyp was sessile. The polyp was removed with a                            cold snare. Resection and retrieval were complete.                           Multiple large-mouthed and small-mouthed                            diverticula were found in the sigmoid colon and                            descending colon.                           Internal hemorrhoids were found during                            retroflexion. The hemorrhoids were small. Complications:            No immediate complications. Estimated Blood Loss:     Estimated blood loss was minimal. Impression:               - Medium-sized lipoma in the ascending colon.                           - One 4 mm polyp in the transverse colon, removed                             with a cold snare. Resected and retrieved.                           - One 5 mm polyp in the descending colon, removed                            with a cold snare. Resected and retrieved.                           - Moderate diverticulosis in the sigmoid colon and                            in the descending colon.                           - Small internal hemorrhoids. Recommendation:           - Patient has a contact number available for  emergencies. The signs and symptoms of potential                            delayed complications were discussed with the                            patient. Return to normal activities tomorrow.                            Written discharge instructions were provided to the                            patient.                           - Resume previous diet.                           - Continue present medications.                           - Await pathology results.                           - Repeat colonoscopy in 5 years for surveillance. Jerene Bears, MD 04/13/2022 12:00:37 PM This report has been signed electronically.

## 2022-04-13 NOTE — Progress Notes (Signed)
To pacu, VSS. Report to rn.tb °

## 2022-04-13 NOTE — Progress Notes (Signed)
Pt's states no medical or surgical changes since previsit or office visit. 

## 2022-04-13 NOTE — Progress Notes (Signed)
GASTROENTEROLOGY PROCEDURE H&P NOTE   Primary Care Physician: Rebecka Apley, MD    Reason for Procedure:  History of multiple adenomatous colon polyps and family history of colon cancer  Plan:    Colonoscopy  Patient is appropriate for endoscopic procedure(s) in the ambulatory ([place]) setting.  The nature of the procedure, as well as the risks, benefits, and alternatives were carefully and thoroughly reviewed with the patient. Ample time for discussion and questions allowed. The patient understood, was satisfied, and agreed to proceed.     HPI: Catherine Navarro is a 67 y.o. female who presents for surveillance colonoscopy.  Medical history as below.  Tolerated the prep.  No recent chest pain or shortness of breath.  No abdominal pain today.  Past Medical History:  Diagnosis Date   Abdominal pain 11/27/2010   Accessory skin tags 01/25/2013   Allergy    Anemia    past hx   Anisocoria    right eye   Breast cancer ([place]) 2023   left breast   Cancer ([place])    skin cancer removed from nose    Colon polyps 12/22/2010   Tubular Adenoma and Hyperplastic Polyp   DDD (degenerative disc disease), cervical    Elevated LFTs 11/27/2010   Family history of breast cancer    Family history of colon cancer    Family history of colon cancer    Fatty infiltration of liver    Gastritis    GERD (gastroesophageal reflux disease)    Hiatal hernia    HTN (hypertension)    Hypothyroidism    with nodules   IBS (irritable bowel syndrome)    Muscle spasm of back 06/04/2015   Osteoarthritis    Pancreatitis    Sinusitis     Past Surgical History:  Procedure Laterality Date   ANAL FISTULECTOMY     BREAST LUMPECTOMY WITH RADIOACTIVE SEED AND SENTINEL LYMPH NODE BIOPSY Left 07/03/2021   Procedure: LEFT BREAST LUMPECTOMY WITH RADIOACTIVE SEED AND SENTINEL LYMPH NODE BIOPSY;  Surgeon: Coralie Keens, MD;  Location: [facility];  Service: General;  Laterality: Left;   LMA   CHOLECYSTECTOMY     COLONOSCOPY     COLONOSCOPY W/ BIOPSIES     ESOPHAGOGASTRODUODENOSCOPY     EYE SURGERY     PATELLA RECONSTRUCTION     pins   POLYPECTOMY     REPLACEMENT TOTAL KNEE Right 12/2017   TUBAL LIGATION     UPPER GASTROINTESTINAL ENDOSCOPY      Prior to Admission medications   Medication Sig Start Date End Date Taking? Authorizing Provider  anastrozole (ARIMIDEX) 1 MG tablet Take 1 tablet (1 mg total) by mouth daily. 09/23/21  Yes Nicholas Lose, MD  Ascorbic Acid (VITAMIN C) 100 MG tablet Take 100 mg by mouth daily.   Yes [provider]  Calcium Carbonate-Vitamin D (CALCIUM-D) 600-400 MG-UNIT TABS Take by mouth every other day.    Yes [provider]  fish oil-omega-3 fatty acids 1000 MG capsule Take 1 g by mouth every other day.   Yes [provider]  Garlic [id number] MG TABS Take by mouth. 1000 mg daily   Yes [provider]  magnesium oxide (MAG-OX) 400 (240 Mg) MG tablet Take 500 mg by mouth daily.   Yes [provider]  pantoprazole (PROTONIX) 40 MG tablet Take 1 tablet by mouth two  times daily 08/01/19  Yes Jacelyn Pi, Lilia Argue, MD  potassium chloride SA (KLOR-CON) 20 MEQ tablet Take  1 tablet (20 mEq total) by mouth daily. Patient taking differently: Take 20 mEq by mouth as directed. Every other day per pt 08/04/19  Yes Jacelyn Pi, Lilia Argue, MD  propranolol ER (INDERAL LA) 80 MG 24 hr capsule TAKE 1 CAPSULE BY MOUTH EVERY DAY Patient taking differently: at bedtime. TAKE 1 CAPSULE BY MOUTH EVERY DAY 02/06/20  Yes Gosrani, Nimish C, MD  valsartan-hydrochlorothiazide (DIOVAN-HCT) 80-12.5 MG tablet Take 1 tablet by mouth daily. 03/05/20  Yes Gosrani, Nimish C, MD  vitamin B-12 (CYANOCOBALAMIN) 1000 MCG tablet Take 1,000 mcg by mouth daily.   Yes [provider]  Zinc 100 MG TABS Take by mouth.   Yes [provider]  aspirin 81 MG tablet Take 81 mg by mouth daily.    [provider]  fluticasone (FLONASE)  50 MCG/ACT nasal spray Place 2 sprays into both nostrils daily. Patient taking differently: Place 2 sprays into both nostrils as needed. 12/30/16   Harrison Mons, PA  loratadine (CLARITIN REDITABS) 10 MG dissolvable tablet Take 10 mg by mouth daily.    [provider]  meloxicam (MOBIC) 15 MG tablet Take 1 tablet (15 mg total) by mouth daily as needed for pain. Take 1 tablet by mouth  daily 08/01/19   Jacelyn Pi, Lilia Argue, MD  Multiple Vitamin (MULTIVITAMIN PO) Take by mouth 1 day or 1 dose.    [provider]  nystatin (MYCOSTATIN/NYSTOP) powder Apply 1 Application topically 2 (two) times daily. 03/09/22   Estill Dooms, NP  pyridOXINE (VITAMIN B-6) 100 MG tablet Take 100 mg by mouth daily.     [provider]  propranolol (INNOPRAN XL) 80 MG 24 hr capsule Take 1 capsule (80 mg total) by mouth at bedtime. 04/21/12 12/29/12  Harrison Mons, PA    Current Outpatient Medications  Medication Sig Dispense Refill   anastrozole (ARIMIDEX) 1 MG tablet Take 1 tablet (1 mg total) by mouth daily. 90 tablet 3   Ascorbic Acid (VITAMIN C) 100 MG tablet Take 100 mg by mouth daily.     Calcium Carbonate-Vitamin D (CALCIUM-D) 600-400 MG-UNIT TABS Take by mouth every other day.      fish oil-omega-3 fatty acids 1000 MG capsule Take 1 g by mouth every other day.     Garlic [id number] MG TABS Take by mouth. 1000 mg daily     magnesium oxide (MAG-OX) 400 (240 Mg) MG tablet Take 500 mg by mouth daily.     pantoprazole (PROTONIX) 40 MG tablet Take 1 tablet by mouth two  times daily 180 tablet 3   potassium chloride SA (KLOR-CON) 20 MEQ tablet Take 1 tablet (20 mEq total) by mouth daily. (Patient taking differently: Take 20 mEq by mouth as directed. Every other day per pt) 90 tablet 1   propranolol ER (INDERAL LA) 80 MG 24 hr capsule TAKE 1 CAPSULE BY MOUTH EVERY DAY (Patient taking differently: at bedtime. TAKE 1 CAPSULE BY MOUTH EVERY DAY) 90 capsule 1   valsartan-hydrochlorothiazide  (DIOVAN-HCT) 80-12.5 MG tablet Take 1 tablet by mouth daily. 90 tablet 1   vitamin B-12 (CYANOCOBALAMIN) 1000 MCG tablet Take 1,000 mcg by mouth daily.     Zinc 100 MG TABS Take by mouth.     aspirin 81 MG tablet Take 81 mg by mouth daily.     fluticasone (FLONASE) 50 MCG/ACT nasal spray Place 2 sprays into both nostrils daily. (Patient taking differently: Place 2 sprays into both nostrils as needed.) 48 g 3   loratadine (CLARITIN  REDITABS) 10 MG dissolvable tablet Take 10 mg by mouth daily.     meloxicam (MOBIC) 15 MG tablet Take 1 tablet (15 mg total) by mouth daily as needed for pain. Take 1 tablet by mouth  daily 90 tablet 1   Multiple Vitamin (MULTIVITAMIN PO) Take by mouth 1 day or 1 dose.     nystatin (MYCOSTATIN/NYSTOP) powder Apply 1 Application topically 2 (two) times daily. 60 g 2   pyridOXINE (VITAMIN B-6) 100 MG tablet Take 100 mg by mouth daily.      Current Facility-Administered Medications  Medication Dose Route Frequency Provider Last Rate Last Admin   0.9 %  sodium chloride infusion  500 mL Intravenous Continuous Avarey Yaeger, Lajuan Lines, MD       0.9 %  sodium chloride infusion  500 mL Intravenous Once Cystal Shannahan, Lajuan Lines, MD        Allergies as of 04/13/2022 - Review Complete 04/13/2022  Allergen Reaction Noted   Amoxicillin  01/09/2013   Codeine     Famotidine Nausea Only 02/03/2022    Family History  Problem Relation Age of Onset   Heart attack Paternal Grandfather    Alzheimer's disease Paternal Grandmother    Diabetes Maternal Grandmother    COPD Father        emphysema   Cancer Father    Diabetes Mother    Irritable bowel syndrome Mother    Stroke Mother    Cancer Brother 83       colon-colorectal colon cancer   Colon cancer Brother    Rectal cancer Brother    Cancer Brother        lung   Hyperlipidemia Brother    Colon polyps Brother    Hypertension Brother    Colon cancer Sister    Liver cancer Sister    Non-Hodgkin's lymphoma Sister    Hyperlipidemia Sister     Hypertension Sister    Colon polyps Sister    Diabetes Sister    Liver disease Sister        fatty liver, cirrhosis   Colon polyps Sister    Kartagener's syndrome Sister    Cirrhosis Sister        non-alcoholic   Heart attack Son 65       rare genetic condition   Breast cancer Maternal Aunt 68   Alzheimer's disease Maternal Aunt    Non-Hodgkin's lymphoma Paternal Uncle    Non-Hodgkin's lymphoma Niece    Heart disease Neg Hx    Esophageal cancer Neg Hx    Stomach cancer Neg Hx     Social History   Socioeconomic History   Marital status: Married    Spouse name: BF: Laddie Aquas   Number of children: 3   Years of education: 12+   Highest education level: Not on file  Occupational History   Occupation: Education officer, environmental: OTHER  Tobacco Use   Smoking status: Former    Types: Cigarettes    Quit date: 01/26/1998    Years since quitting: 24.2    Passive exposure: Past   Smokeless tobacco: Never  Vaping Use   Vaping Use: Never used  Substance and Sexual Activity   Alcohol use: No   Drug use: No   Sexual activity: Yes    Birth control/protection: Post-menopausal, Surgical    Comment: tubal  Other Topics Concern   Not on file  Social History Narrative   Lives alone.Her ex-husband died of heart attack at age 61. 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   One son lives next door, one in [place], [place] and another in [place].   Social Determinants of Health   Financial Resource Strain: Low Risk  (03/09/2022)   Overall Financial Resource Strain (CARDIA)    Difficulty of Paying Living Expenses: Not hard at all  Food Insecurity: No Food Insecurity (03/09/2022)   Hunger Vital Sign    Worried About Running Out of Food in the Last Year: Never true    Ran Out of Food in the Last Year: Never true  Transportation Needs: No Transportation Needs (03/09/2022)   PRAPARE - Hydrologist  (Medical): No    Lack of Transportation (Non-Medical): No  Physical Activity: Inactive (03/09/2022)   Exercise Vital Sign    Days of Exercise per Week: 0 days    Minutes of Exercise per Session: 0 min  Stress: No Stress Concern Present (03/09/2022)   [place]    Feeling of Stress : Not at all  Social Connections: Moderately Integrated (03/09/2022)   Social Connection and Isolation Panel [NHANES]    Frequency of Communication with Friends and Family: More than three times a week    Frequency of Social Gatherings with Friends and Family: Twice a week    Attends Religious Services: More than 4 times per year    Active Member of [employer] or Organizations: No    Attends Archivist Meetings: Never    Marital Status: Married  Human resources officer Violence: Not At Risk (03/09/2022)   Humiliation, Afraid, Rape, and Kick questionnaire    Fear of Current or Ex-Partner: No    Emotionally Abused: No    Physically Abused: No    Sexually Abused: No    Physical Exam: Vital signs in last 24 hours: @BP  138/69   Pulse 66   Temp 98.7 F (37.1 C)   Ht 5\' 6"  (1.676 m)   Wt 249 lb (112.9 kg)   SpO2 95%   BMI 40.19 kg/m  GEN: NAD EYE: Sclerae anicteric ENT: MMM CV: Non-tachycardic Pulm: CTA b/l GI: Soft, NT/ND NEURO:  Alert & Oriented x 3   Zenovia Jarred, MD [facility]  04/13/2022 11:31 AM

## 2022-04-14 ENCOUNTER — Telehealth: Payer: Self-pay

## 2022-04-14 NOTE — Telephone Encounter (Signed)
  Follow up Call-     04/13/2022   10:52 AM  Call back number  Post procedure Call Back phone  # [phone]  Permission to leave phone message Yes     Patient questions:  Do you have a fever, pain , or abdominal swelling? No. Pain Score  0 *  Have you tolerated food without any problems? Yes.    Have you been able to return to your normal activities? Yes.    Do you have any questions about your discharge instructions: Diet   No. Medications  No. Follow up visit  No.  Do you have questions or concerns about your Care? No.  Actions: * If pain score is 4 or above: No action needed, pain <4.

## 2022-04-16 ENCOUNTER — Encounter: Payer: Self-pay | Admitting: Internal Medicine

## 2022-04-17 ENCOUNTER — Encounter: Payer: Self-pay | Admitting: Rehabilitation

## 2022-04-17 ENCOUNTER — Ambulatory Visit: Payer: BC Managed Care – PPO | Attending: Surgery | Admitting: Rehabilitation

## 2022-04-17 DIAGNOSIS — Z483 Aftercare following surgery for neoplasm: Secondary | ICD-10-CM

## 2022-04-17 DIAGNOSIS — R293 Abnormal posture: Secondary | ICD-10-CM

## 2022-04-17 DIAGNOSIS — C50912 Malignant neoplasm of unspecified site of left female breast: Secondary | ICD-10-CM

## 2022-04-17 DIAGNOSIS — Z170 Estrogen receptor positive status [ER+]: Secondary | ICD-10-CM | POA: Insufficient documentation

## 2022-04-17 NOTE — Therapy (Signed)
OUTPATIENT PHYSICAL THERAPY SOZO SCREENING NOTE   Patient Name: Catherine Navarro MRN: [id number] DOB:1955/03/20, 67 y.o., female Today's Date: 04/17/2022  PCP: Rebecka Apley, MD REFERRING PROVIDER: Coralie Keens, MD   PT End of Session - 04/17/22 1100     Visit Number 1   screen only   PT Start Time 46    PT Stop Time 1109    PT Time Calculation (min) 6 min    Activity Tolerance Patient tolerated treatment well    Behavior During Therapy [facility] for tasks assessed/performed             Past Medical History:  Diagnosis Date   Abdominal pain 11/27/2010   Accessory skin tags 01/25/2013   Allergy    Anemia    past hx   Anisocoria    right eye   Breast cancer ([place]) 2023   left breast   Cancer ([place])    skin cancer removed from nose    Colon polyps 12/22/2010   Tubular Adenoma and Hyperplastic Polyp   DDD (degenerative disc disease), cervical    Elevated LFTs 11/27/2010   Family history of breast cancer    Family history of colon cancer    Family history of colon cancer    Fatty infiltration of liver    Gastritis    GERD (gastroesophageal reflux disease)    Hiatal hernia    HTN (hypertension)    Hypothyroidism    with nodules   IBS (irritable bowel syndrome)    Muscle spasm of back 06/04/2015   Osteoarthritis    Pancreatitis    Sinusitis    Past Surgical History:  Procedure Laterality Date   ANAL FISTULECTOMY     BREAST LUMPECTOMY WITH RADIOACTIVE SEED AND SENTINEL LYMPH NODE BIOPSY Left 07/03/2021   Procedure: LEFT BREAST LUMPECTOMY WITH RADIOACTIVE SEED AND SENTINEL LYMPH NODE BIOPSY;  Surgeon: Coralie Keens, MD;  Location: [facility];  Service: General;  Laterality: Left;  LMA   CHOLECYSTECTOMY     COLONOSCOPY     COLONOSCOPY W/ BIOPSIES     ESOPHAGOGASTRODUODENOSCOPY     EYE SURGERY     PATELLA RECONSTRUCTION     pins   POLYPECTOMY     REPLACEMENT TOTAL KNEE Right 12/2017   TUBAL LIGATION     UPPER GASTROINTESTINAL  ENDOSCOPY     Patient Active Problem List   Diagnosis Date Noted   History of breast cancer 03/09/2022   Encounter for screening fecal occult blood testing 03/09/2022   Superficial fungus infection of skin 03/09/2022   Genetic testing 07/17/2021   Family history of colon cancer 06/25/2021   Family history of breast cancer 06/25/2021   Malignant neoplasm of upper-outer quadrant of left female breast ([place]) 06/20/2021   Screening for colorectal cancer 02/07/2019   Encounter for gynecological examination with Papanicolaou smear of cervix 02/07/2019   Enlarged thyroid 02/07/2019   Hyperglycemia 12/29/2018   H/O hypokalemia 12/29/2018   Pain in right knee 07/21/2017   Aortic atherosclerosis ([place]) 01/04/2017   Degenerative disc disease, lumbar 01/04/2017   Multinodular goiter 06/17/2016   Muscle spasm of back 06/04/2015   Cutaneous skin tags 04/12/2014   BMI 36.0-36.9,adult 08/01/2013   Anisocoria 07/26/2012   Family history of malignant neoplasm of gastrointestinal tract 12/22/2010   Diverticulosis of colon (without mention of hemorrhage) 12/22/2010   Benign neoplasm of colon 12/22/2010   Fatty liver 11/27/2010   Costochondral chest pain 11/27/2010   GERD (gastroesophageal reflux disease) 11/27/2010  HELICOBACTER PYLORI GASTRITIS, HX OF 05/02/2009   ABDOMINAL PAIN-RUQ 07/12/2007   Essential hypertension 03/23/2007   Allergic rhinitis 03/23/2007   IBS 03/23/2007   Osteoarthritis of right knee 03/23/2007   H/O non anemic vitamin B12 deficiency 03/23/2007   History of acute pancreatitis 03/23/2007   GASTRITIS 12/21/2005   HIATAL HERNIA 12/21/2005    REFERRING DIAG: left breast cancer at risk for lymphedema  THERAPY DIAG: Aftercare following surgery for neoplasm  Malignant neoplasm of left breast in female, estrogen receptor positive, unspecified site of breast ([employer])  Abnormal posture  PERTINENT HISTORY: diagnosed on 06/11/2021 with left Breast Cancer. It measures .6 cm and is  located in the 10:00 position of the left breast . It is ER+, PR+ with a Ki67 of 15%. HER 2 is undetermined presently. She is s/p left lumpectomy with SLNB on 07/03/2021  PRECAUTIONS: left UE Lymphedema risk, None  SUBJECTIVE: Pt returns for her first 3 month L-Dex screen.   PAIN:  Are you having pain? No  SOZO SCREENING: Patient was assessed today using the SOZO machine to determine the lymphedema index score. This was compared to her baseline score. It was determined that she is within the recommended range when compared to her baseline and no further action is needed at this time. She will continue SOZO screenings. These are done every 3 months for 2 years post operatively followed by every 6 months for 2 years, and then annually.   Resent referral request this time to Wilber Bihari NP to transfer SOZO screenings. [place] in [place], [place], [place] 04/17/2022, 12:06 PM

## 2022-04-22 ENCOUNTER — Other Ambulatory Visit: Payer: Self-pay

## 2022-04-22 DIAGNOSIS — C50912 Malignant neoplasm of unspecified site of left female breast: Secondary | ICD-10-CM

## 2022-04-22 DIAGNOSIS — Z170 Estrogen receptor positive status [ER+]: Secondary | ICD-10-CM

## 2022-04-22 NOTE — Progress Notes (Signed)
Spoke with pt in regards to [facility] screen referral to [place], [place]. She states she would prefer to continue coming to [facility] for SOZO screens [place]. Referral D/C per NP.

## 2022-05-04 ENCOUNTER — Other Ambulatory Visit: Payer: Self-pay | Admitting: Hematology and Oncology

## 2022-05-05 ENCOUNTER — Telehealth: Payer: Self-pay | Admitting: Hematology and Oncology

## 2022-05-05 NOTE — Telephone Encounter (Signed)
Scheduled appointment per staff message. Patient is aware of the made appointment. 

## 2022-07-13 ENCOUNTER — Other Ambulatory Visit: Payer: Self-pay | Admitting: Hematology and Oncology

## 2022-07-28 NOTE — Progress Notes (Signed)
HEMATOLOGY-ONCOLOGY WEBEX VISIT PROGRESS NOTE  I connected with Catherine Navarro on 08/04/2022 at  8:30 AM EDT by Mychart video conference and verified that I am speaking with the correct person using two identifiers.  I discussed the limitations, risks, security and privacy concerns of performing an evaluation and management service by Webex and the availability of in person appointments.  I also discussed with the patient that there may be a patient responsible charge related to this service. The patient expressed understanding and agreed to proceed.  Patient's Location: Home Physician Location: Clinic  CHIEF COMPLIANT: Follow-up on anastrozole  INTERVAL HISTORY: Catherine Navarro is a 67 y.o. female with above-mentioned history of left breast cancer. She presents to the clinic for a MyChart follow-up to discuss tolerance to anastrozole therapy.  Overall she appears to be tolerating anastrozole fairly well.  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  Recently she had cataract surgery as well as has some issues with carpal tunnels.  Overall she thinks that the symptoms related to the antiestrogen therapy are manageable and gotten better over time.   Oncology History  Malignant neoplasm of upper-outer quadrant of left female breast (HCC)  06/20/2021 Initial Diagnosis   Malignant neoplasm of upper-outer quadrant of left female breast (HCC).  Biopsy: Invasive mammary cancer grade 2, ER 100%, PR 100%, Ki-67 15%, HER2 equivocal 2+ by IHC, FISH negative    06/25/2021 Cancer Staging   Staging form: Breast, AJCC 8th Edition - Clinical: Stage IA (cT1b, cN0, cM0, G2, ER+, PR+, HER2: Equivocal) - Signed by Serena Croissant, MD on 06/25/2021 Stage prefix: Initial diagnosis Histologic grading system: 3 grade system   07/03/2021 Surgery    07/03/21: Left Lumpectomy Grade 2 IDC, 0/2 LN Neg, ER 100%, PR 100%,  Ki-67 15%, HER2 equivocal 2+ by IHC, FISH negative    07/03/2021 Oncotype testing   Oncotype DX Breast Recurrence Score = 0 (Distant Recurrence Risk at 9 years with AI or TAM Alone = 3%, Group Average Absolute Chemotherapy (CT) Benefit = <1%)    07/03/2021 Cancer Staging   Staging form: Breast, AJCC 8th Edition - Pathologic stage from 07/03/2021: Stage IA (pT1b, pN0, cM0, G2, ER+, PR+, HER2-, Oncotype DX score: 0) - Signed by Loa Socks, NP on 02/03/2022 Stage prefix: Initial diagnosis Multigene prognostic tests performed: Oncotype DX Recurrence score range: Less than 11 Histologic grading system: 3 grade system   07/17/2021 Genetic Testing   Negative genetic testing on the CancerNext-Expanded+RNAinsight panel.  The report date is July 17, 2021.  The CancerNext-Expanded gene panel offered by [facility] and includes sequencing and rearrangement analysis for the following 77 genes: AIP, ALK, APC*, ATM*, AXIN2, BAP1, BARD1, BLM, BMPR1A, BRCA1*, BRCA2*, BRIP1*, CDC73, CDH1*, CDK4, CDKN1B, CDKN2A, CHEK2*, CTNNA1, DICER1, FANCC, FH, FLCN, GALNT12, KIF1B, LZTR1, MAX, MEN1, MET, MLH1*, MSH2*, MSH3, MSH6*, MUTYH*, NBN, NF1*, NF2, NTHL1, PALB2*, PHOX2B, PMS2*, POT1, PRKAR1A, PTCH1, PTEN*, RAD51C*, RAD51D*, RB1, RECQL, RET, SDHA, SDHAF2, SDHB, SDHC, SDHD, SMAD4, SMARCA4, SMARCB1, SMARCE1, STK11, SUFU, TMEM127, TP53*, TSC1, TSC2, VHL and XRCC2 (sequencing and deletion/duplication); EGFR, EGLN1, HOXB13, KIT, MITF, PDGFRA, POLD1, and POLE (sequencing only); EPCAM and GREM1 (deletion/duplication only). DNA and RNA analyses performed for * genes.    08/18/2021 - 09/12/2021 Radiation Therapy   Adjuvant radiation at [facility]:  Left Breast: 42.56 Gy in 16 treatments Left breast boost: 10 Gy in 4 treatments   09/2021 -  Anti-estrogen oral therapy   Anastrozole daily     REVIEW OF SYSTEMS:     All other systems were reviewed with the patient and are negative.  Observations/Objective:    I  have reviewed the data as listed    Latest Ref Rng & Units 06/25/2021    2:29 PM 12/29/2019    8:18 AM 06/29/2019    9:50 AM  CMP  Glucose 70 - 99 mg/dL 130  865  784   BUN 8 - 23 mg/dL 11  14  19    Creatinine 0.44 - 1.00 mg/dL 6.96  2.95  2.84   Sodium 135 - 145 mmol/L 139  143  143   Potassium 3.5 - 5.1 mmol/L 3.6  3.8  4.1   Chloride 98 - 111 mmol/L 105  105  104   CO2 22 - 32 mmol/L 27  30  22    Calcium 8.9 - 10.3 mg/dL 9.8  9.9  13.2   Total Protein 6.1 - 8.1 g/dL  6.5  6.9   Total Bilirubin 0.2 - 1.2 mg/dL  0.5  0.5   Alkaline Phos 48 - 121 IU/L   103   AST 10 - 35 U/L  35  30   ALT 6 - 29 U/L  40  38     Lab Results  Component Value Date   WBC 5.5 12/29/2019   HGB 12.8 12/29/2019   HCT 38.6 12/29/2019   MCV 86.4 12/29/2019   PLT 211 12/29/2019   NEUTROABS 2,706 12/29/2019      Assessment Plan:  Malignant neoplasm of upper-outer quadrant of left female breast (HCC) Screening mammogram detected asymmetry left breast, ultrasound revealed 6 mm irregular mass 10 o'clock position: Biopsy: Invasive mammary cancer grade 2, ER 100%, PR 100%, Ki-67 15%, HER2 equivocal 2+ by IHC, FISH negative   07/03/21: Left Lumpectomy Grade 2 IDC, 0/2 LN Neg, ER 100%, PR 100%, Ki-67 15%, HER2 equivocal 2+ by IHC, FISH negative   Oncotype Dx: Recurrence score 0: Distant recurrence at 9 years: 3%   Recommendation: 1. Adjuvant radiation Completed 09/12/21 (she lives 2 hours away and wishes to receive radiation locally closer to her home) at [place] moving to [facility] [place]  3.  Followed by adjuvant antiestrogen therapy with Anastrozole 1 mg daily x5 years started 09/30/2021   Anastrozole toxicities: Arthralgias: improved Mild hot flashes: subsided Irritability  She will get a BD in Aug 2024 Cataract surgery  Breast cancer surveillance: Mammogram scheduled for 08/06/2022  Return to clinic in 1 year for follow-up   I discussed the assessment and treatment plan with  the patient. The patient was provided an opportunity to ask questions and all were answered. The patient agreed with the plan and demonstrated an understanding of the instructions. The patient was advised to call back or seek an in-person evaluation if the symptoms worsen or if the condition fails to improve as anticipated.   I provided 20 minutes of face-to-face vodeo time as well as for charting and coordination of care during this encounter.    Sabas Sous, MD 08/04/2022 I Janan Ridge am acting as a Neurosurgeon for [employer]  I have reviewed the above documentation for accuracy and completeness, and I agree with the above.

## 2022-07-29 ENCOUNTER — Other Ambulatory Visit: Payer: Self-pay

## 2022-07-29 DIAGNOSIS — C50412 Malignant neoplasm of upper-outer quadrant of left female breast: Secondary | ICD-10-CM

## 2022-08-04 ENCOUNTER — Inpatient Hospital Stay: Payer: Medicare Other | Attending: Hematology and Oncology | Admitting: Hematology and Oncology

## 2022-08-04 DIAGNOSIS — C50412 Malignant neoplasm of upper-outer quadrant of left female breast: Secondary | ICD-10-CM | POA: Insufficient documentation

## 2022-08-04 DIAGNOSIS — Z79811 Long term (current) use of aromatase inhibitors: Secondary | ICD-10-CM | POA: Insufficient documentation

## 2022-08-04 DIAGNOSIS — Z170 Estrogen receptor positive status [ER+]: Secondary | ICD-10-CM | POA: Insufficient documentation

## 2022-08-04 NOTE — Assessment & Plan Note (Signed)
Screening mammogram detected asymmetry left breast, ultrasound revealed 6 mm irregular mass 10 o'clock position: Biopsy: Invasive mammary cancer grade 2, ER 100%, PR 100%, Ki-67 15%, HER2 equivocal 2+ by IHC, FISH negative   07/03/21: Left Lumpectomy Grade 2 IDC, 0/2 LN Neg, ER 100%, PR 100%, Ki-67 15%, HER2 equivocal 2+ by IHC, FISH negative   Oncotype Dx: Recurrence score 0: Distant recurrence at 9 years: 3%   Recommendation: 1. Adjuvant radiation (she lives 2 hours away and wishes to receive radiation locally closer to her home) at [place]. Completed 09/12/21 3.  Followed by adjuvant antiestrogen therapy with Anastrozole 1 mg daily x5 years to start 09/30/2021   Anastrozole toxicities: Mild arthralgias Mild hot flashes Irritability  Breast cancer surveillance: Mammogram scheduled for 08/06/2022  Return to clinic in 1 year for follow-up

## 2022-08-06 ENCOUNTER — Ambulatory Visit
Admission: RE | Admit: 2022-08-06 | Discharge: 2022-08-06 | Disposition: A | Discharge status: Home / Self Care | Payer: BC Managed Care – PPO | Source: Ambulatory Visit | Attending: Adult Health | Admitting: Adult Health

## 2022-08-06 DIAGNOSIS — Z170 Estrogen receptor positive status [ER+]: Secondary | ICD-10-CM

## 2022-08-06 HISTORY — DX: Personal history of irradiation: Z92.3

## 2022-08-07 ENCOUNTER — Telehealth: Payer: Self-pay | Admitting: Hematology and Oncology

## 2022-08-07 NOTE — Telephone Encounter (Signed)
Scheduled appointments per los. Patient is aware of the made appointment.

## 2023-05-21 ENCOUNTER — Other Ambulatory Visit: Payer: Self-pay | Admitting: Adult Health

## 2023-05-21 DIAGNOSIS — Z853 Personal history of malignant neoplasm of breast: Secondary | ICD-10-CM

## 2023-07-03 ENCOUNTER — Other Ambulatory Visit: Payer: Self-pay | Admitting: Hematology and Oncology

## 2023-08-09 ENCOUNTER — Ambulatory Visit
Admission: RE | Admit: 2023-08-09 | Discharge: 2023-08-09 | Disposition: A | Discharge status: Home / Self Care | Source: Ambulatory Visit | Attending: Adult Health | Admitting: Adult Health

## 2023-08-09 DIAGNOSIS — Z853 Personal history of malignant neoplasm of breast: Secondary | ICD-10-CM

## 2023-08-30 ENCOUNTER — Inpatient Hospital Stay: Payer: Medicare Other | Attending: Hematology and Oncology | Admitting: Hematology and Oncology

## 2023-08-30 DIAGNOSIS — C50412 Malignant neoplasm of upper-outer quadrant of left female breast: Secondary | ICD-10-CM | POA: Diagnosis not present

## 2023-08-30 DIAGNOSIS — Z170 Estrogen receptor positive status [ER+]: Secondary | ICD-10-CM | POA: Diagnosis not present

## 2023-08-30 NOTE — Progress Notes (Signed)
 HEMATOLOGY-ONCOLOGY TELEPHONE VISIT PROGRESS NOTE  I connected with our patient on 08/30/23 at  9:00 AM EDT by telephone and verified that I am speaking with the correct person using two identifiers.  I discussed the limitations, risks, security and privacy concerns of performing an evaluation and management service by telephone and the availability of in person appointments.  I also discussed with the patient that there may be a patient responsible charge related to this service. The patient expressed understanding and agreed to proceed.   History of Present Illness: Telephone follow-up and surveillance of breast cancer on anastrozole  therapy  History of Present Illness Catherine Navarro is a 68 year old female with breast cancer who presents for follow-up regarding her anastrozole  treatment.  She has been on anastrozole  for two years following surgery in June 2023 and completion of radiation therapy in August 2023. Missing a dose causes a noticeable change but not significant discomfort. She does not experience severe hot flashes or joint pains, though she occasionally takes meloxicam  twice a week for deep bone pain in her arm. Generalized aches occur but are manageable.  Her last mammogram in July 2025 was satisfactory. A bone density scan was performed at [facility] in August 2024.    Oncology History  Malignant neoplasm of upper-outer quadrant of left female breast (HCC)  06/20/2021 Initial Diagnosis   Malignant neoplasm of upper-outer quadrant of left female breast (HCC).  Biopsy: Invasive mammary cancer grade 2, ER 100%, PR 100%, Ki-67 15%, HER2 equivocal 2+ by IHC, FISH negative    06/25/2021 Cancer Staging   Staging form: Breast, AJCC 8th Edition - Clinical: Stage IA (cT1b, cN0, cM0, G2, ER+, PR+, HER2: Equivocal) - Signed by Odean Potts, MD on 06/25/2021 Stage prefix: Initial diagnosis Histologic grading system: 3 grade system   07/03/2021 Surgery    07/03/21: Left  Lumpectomy Grade 2 IDC, 0/2 LN Neg, ER 100%, PR 100%, Ki-67 15%, HER2 equivocal 2+ by IHC, FISH negative    07/03/2021 Oncotype testing   Oncotype DX Breast Recurrence Score = 0 (Distant Recurrence Risk at 9 years with AI or TAM Alone = 3%, Group Average Absolute Chemotherapy (CT) Benefit = <1%)    07/03/2021 Cancer Staging   Staging form: Breast, AJCC 8th Edition - Pathologic stage from 07/03/2021: Stage IA (pT1b, pN0, cM0, G2, ER+, PR+, HER2-, Oncotype DX score: 0) - Signed by Crawford Morna Pickle, NP on 02/03/2022 Stage prefix: Initial diagnosis Multigene prognostic tests performed: Oncotype DX Recurrence score range: Less than 11 Histologic grading system: 3 grade system   07/17/2021 Genetic Testing   Negative genetic testing on the CancerNext-Expanded+RNAinsight panel.  The report date is July 17, 2021.  The CancerNext-Expanded gene panel offered by [facility] and includes sequencing and rearrangement analysis for the following 77 genes: AIP, ALK, APC*, ATM*, AXIN2, BAP1, BARD1, BLM, BMPR1A, BRCA1*, BRCA2*, BRIP1*, CDC73, CDH1*, CDK4, CDKN1B, CDKN2A, CHEK2*, CTNNA1, DICER1, FANCC, FH, FLCN, GALNT12, KIF1B, LZTR1, MAX, MEN1, MET, MLH1*, MSH2*, MSH3, MSH6*, MUTYH*, NBN, NF1*, NF2, NTHL1, PALB2*, PHOX2B, PMS2*, POT1, PRKAR1A, PTCH1, PTEN*, RAD51C*, RAD51D*, RB1, RECQL, RET, SDHA, SDHAF2, SDHB, SDHC, SDHD, SMAD4, SMARCA4, SMARCB1, SMARCE1, STK11, SUFU, TMEM127, TP53*, TSC1, TSC2, VHL and XRCC2 (sequencing and deletion/duplication); EGFR, EGLN1, HOXB13, KIT, MITF, PDGFRA, POLD1, and POLE (sequencing only); EPCAM and GREM1 (deletion/duplication only). DNA and RNA analyses performed for * genes.    08/18/2021 - 09/12/2021 Radiation Therapy   Adjuvant radiation at [facility]:  Left Breast: 42.56 Gy in 16 treatments Left breast boost:  10 Gy in 4 treatments   09/2021 -  Anti-estrogen oral therapy   Anastrozole  daily     REVIEW OF SYSTEMS:   Constitutional: Denies fevers, chills or abnormal  weight loss All other systems were reviewed with the patient and are negative. Observations/Objective:     Assessment Plan:  Malignant neoplasm of upper-outer quadrant of left female breast (HCC) Screening mammogram detected asymmetry left breast, ultrasound revealed 6 mm irregular mass 10 o'clock position: Biopsy: Invasive mammary cancer grade 2, ER 100%, PR 100%, Ki-67 15%, HER2 equivocal 2+ by IHC, FISH negative   07/03/21: Left Lumpectomy Grade 2 IDC, 0/2 LN Neg, ER 100%, PR 100%, Ki-67 15%, HER2 equivocal 2+ by IHC, FISH negative   Oncotype Dx: Recurrence score 0: Distant recurrence at 9 years: 3%   Recommendation: 1. Adjuvant radiation Completed 09/12/21 (she lives 2 hours away and wishes to receive radiation locally closer to her home) at [place] moving to [facility] [place]  3.  Followed by adjuvant antiestrogen therapy with Anastrozole  1 mg daily x5 years started 09/30/2021   Anastrozole  toxicities: Arthralgias: improved Mild hot flashes: subsided Irritability   Bone density August 2024 at [facility]: T-score -0.4: Cataract surgery: Because of prior eye accident, she had to have her iris closed in it is much better.   Breast cancer surveillance: Mammogram 08/09/2023) benign breast density category B     Return to clinic in 1 year for follow-up --------------------------------- Assessment and Plan Assessment & Plan Malignant neoplasm of upper-outer quadrant of left breast (female) On anastrozole  for two years post-radiation therapy. Reports improved condition with manageable joint pain. Recent mammogram and bone density satisfactory. - Continue anastrozole  1 mg oral daily. - Continue annual mammograms.      I discussed the assessment and treatment plan with the patient. The patient was provided an opportunity to ask questions and all were answered. The patient agreed with the plan and demonstrated an understanding of the instructions. The  patient was advised to call back or seek an in-person evaluation if the symptoms worsen or if the condition fails to improve as anticipated.   I provided 20 minutes of non-face-to-face time during this encounter.  This includes time for charting and coordination of care   Naomi MARLA Chad, MD

## 2023-08-30 NOTE — Assessment & Plan Note (Signed)
 Screening mammogram detected asymmetry left breast, ultrasound revealed 6 mm irregular mass 10 o'clock position: Biopsy: Invasive mammary cancer grade 2, ER 100%, PR 100%, Ki-67 15%, HER2 equivocal 2+ by IHC, FISH negative   07/03/21: Left Lumpectomy Grade 2 IDC, 0/2 LN Neg, ER 100%, PR 100%, Ki-67 15%, HER2 equivocal 2+ by IHC, FISH negative   Oncotype Dx: Recurrence score 0: Distant recurrence at 9 years: 3%   Recommendation: 1. Adjuvant radiation Completed 09/12/21 (she lives 2 hours away and wishes to receive radiation locally closer to her home) at [place] moving to [facility] [place]  3.  Followed by adjuvant antiestrogen therapy with Anastrozole  1 mg daily x5 years started 09/30/2021   Anastrozole  toxicities: Arthralgias: improved Mild hot flashes: subsided Irritability   She will get a BD in Aug 2024 Cataract surgery   Breast cancer surveillance: Mammogram 08/09/2023) benign breast density category B Breast exam 08/30/2023: Benign   Return to clinic in 1 year for follow-up

## 2024-01-06 ENCOUNTER — Ambulatory Visit: Payer: Self-pay | Admitting: Surgery

## 2024-01-10 ENCOUNTER — Encounter (HOSPITAL_COMMUNITY): Payer: Self-pay

## 2024-01-10 ENCOUNTER — Other Ambulatory Visit: Payer: Self-pay

## 2024-01-10 ENCOUNTER — Encounter (HOSPITAL_COMMUNITY)
Admission: RE | Admit: 2024-01-10 | Discharge: 2024-01-10 | Disposition: A | Source: Ambulatory Visit | Attending: Surgery | Admitting: Surgery

## 2024-01-10 VITALS — BP 151/69 | HR 65 | Temp 98.5°F | Resp 14 | Ht 66.0 in | Wt 238.0 lb

## 2024-01-10 DIAGNOSIS — R9431 Abnormal electrocardiogram [ECG] [EKG]: Secondary | ICD-10-CM | POA: Diagnosis not present

## 2024-01-10 DIAGNOSIS — I10 Essential (primary) hypertension: Secondary | ICD-10-CM

## 2024-01-10 DIAGNOSIS — Z01818 Encounter for other preprocedural examination: Secondary | ICD-10-CM | POA: Diagnosis present

## 2024-01-10 LAB — COMPREHENSIVE METABOLIC PANEL WITH GFR
ALT: 65 U/L — ABNORMAL HIGH (ref 0–44)
AST: 59 U/L — ABNORMAL HIGH (ref 15–41)
Albumin: 4.1 g/dL (ref 3.5–5.0)
Alkaline Phosphatase: 136 U/L — ABNORMAL HIGH (ref 38–126)
Anion gap: 10 (ref 5–15)
BUN: 15 mg/dL (ref 8–23)
CO2: 26 mmol/L (ref 22–32)
Calcium: 10.1 mg/dL (ref 8.9–10.3)
Chloride: 106 mmol/L (ref 98–111)
Creatinine, Ser: 0.79 mg/dL (ref 0.44–1.00)
GFR, Estimated: >60 mL/min (ref >60)
Glucose, Bld: 168 mg/dL — ABNORMAL HIGH (ref 70–99)
Potassium: 3.8 mmol/L (ref 3.5–5.1)
Sodium: 141 mmol/L (ref 135–145)
Total Bilirubin: 0.4 mg/dL (ref 0.0–1.2)
Total Protein: 6.9 g/dL (ref 6.5–8.1)

## 2024-01-10 LAB — CBC
HCT: 41.7 % (ref 36.0–46.0)
Hemoglobin: 13.3 g/dL (ref 12.0–15.0)
MCH: 27.6 pg (ref 26.0–34.0)
MCHC: 31.9 g/dL (ref 30.0–36.0)
MCV: 86.5 fL (ref 80.0–100.0)
Platelets: 194 K/uL (ref 150–400)
RBC: 4.82 MIL/uL (ref 3.87–5.11)
RDW: 12.7 % (ref 11.5–15.5)
WBC: 5.8 K/uL (ref 4.0–10.5)
nRBC: 0 % (ref 0.0–0.2)

## 2024-01-10 NOTE — Progress Notes (Addendum)
 Date of COVID positive in last 90 days:  PCP - Dinah Perdue Cardiologist -  Oncologist- Mackey Chad, MD  Chest x-ray - N/A EKG - 01/10/24 Epic/chart Stress Test - N/A ECHO - N/A Cardiac Cath - N/A Pacemaker/ICD device last checked:N/A Spinal Cord Stimulator:N/A  Bowel Prep - N/A  Sleep Study - N/A CPAP -   Fasting Blood Sugar - preDM Checks Blood Sugar _____ times a day  Last dose of GLP1 agonist-  N/A GLP1 instructions:  Do not take after     Last dose of SGLT-2 inhibitors-  N/A SGLT-2 instructions:  Do not take after     Blood Thinner Instructions: N/A Last dose:   Time: Aspirin Instructions: ASA 81, hold 7 days Last Dose:  Activity level: Can go up a flight of stairs and perform activities of daily living without stopping and without symptoms of chest pain or shortness of breath.  Anesthesia review: HTN, aortic atherosclerosis, non specific T wave abn on EKG  Patient denies shortness of breath, fever, cough and chest pain at PAT appointment  Patient verbalized understanding of instructions that were given to them at the PAT appointment. Patient was also instructed that they will need to review over the PAT instructions again at home before surgery.

## 2024-01-10 NOTE — Patient Instructions (Addendum)
 SURGICAL WAITING ROOM VISITATION  Patients having surgery or a procedure may have no more than 2 support people in the waiting area - these visitors may rotate.    Children ages 62 and under will not be able to visit patients in [facility] under most circumstances.   Visitors with respiratory illnesses are discouraged from visiting and should remain at home.  If the patient needs to stay at the hospital during part of their recovery, the visitor guidelines for inpatient rooms apply. Pre-op nurse will coordinate an appropriate time for 1 support person to accompany patient in pre-op.  This support person may not rotate.    Please refer to the [facility] website for the visitor guidelines for Inpatients (after your surgery is over and you are in a regular room).    Your procedure is scheduled on: 01/14/24   Report to [facility] Main Entrance    Report to admitting at 11:40 AM   Call this number if you have problems the morning of surgery [phone]   Do not eat food :After Midnight.   After Midnight you may have the following liquids until 10:55 AM DAY OF SURGERY  Water Non-Citrus Juices (without pulp, NO RED-Apple, White grape, White cranberry) Black Coffee (NO MILK/CREAM OR CREAMERS, sugar ok)  Clear Tea (NO MILK/CREAM OR CREAMERS, sugar ok) regular and decaf                             Plain Jell-O (NO RED)                                           Fruit ices (not with fruit pulp, NO RED)                                     Popsicles (NO RED)                                                               Sports drinks like Gatorade (NO RED)                      If you have questions, please contact your surgeons office.   FOLLOW BOWEL PREP AND ANY ADDITIONAL PRE OP INSTRUCTIONS YOU RECEIVED FROM YOUR SURGEON'S OFFICE!!!     Oral Hygiene is also important to reduce your risk of infection.                                    Remember - BRUSH YOUR TEETH  THE MORNING OF SURGERY WITH YOUR REGULAR TOOTHPASTE  DENTURES WILL BE REMOVED PRIOR TO SURGERY PLEASE DO NOT APPLY Poly grip OR ADHESIVES!!!   Stop all vitamins and herbal supplements 7 days before surgery.   Take these medicines the morning of surgery with A SIP OF WATER: Pantoprazole               You may not have any metal on your body including  hair pins, jewelry, and body piercing             Do not wear make-up, lotions, powders, perfumes, or deodorant  Do not wear nail polish including gel and S&S, artificial/acrylic nails, or any other type of covering on natural nails including finger and toenails. If you have artificial nails, gel coating, etc. that needs to be removed by a nail salon please have this removed prior to surgery or surgery may need to be canceled/ delayed if the surgeon/ anesthesia feels like they are unable to be safely monitored.   Do not shave  48 hours prior to surgery.    Do not bring valuables to the hospital. [facility] IS NOT             RESPONSIBLE   FOR VALUABLES.   Contacts, glasses, dentures or bridgework may not be worn into surgery.   Bring small overnight bag day of surgery.   DO NOT BRING YOUR HOME MEDICATIONS TO THE HOSPITAL. PHARMACY WILL DISPENSE MEDICATIONS LISTED ON YOUR MEDICATION LIST TO YOU DURING YOUR ADMISSION IN THE HOSPITAL!              Please read over the following fact sheets you were given: IF YOU HAVE QUESTIONS ABOUT YOUR PRE-OP INSTRUCTIONS PLEASE CALL [phone].   If you received a COVID test during your pre-op visit  it is requested that you wear a mask when out in public, stay away from anyone that may not be feeling well and notify your surgeon if you develop symptoms. If you test positive for Covid or have been in contact with anyone that has tested positive in the last 10 days please notify you surgeon.    [place] - Preparing for Surgery Before surgery, you can play an important role.  Because skin is  not sterile, your skin needs to be as free of germs as possible.  You can reduce the number of germs on your skin by washing with CHG (chlorahexidine gluconate) soap before surgery.  CHG is an antiseptic cleaner which kills germs and bonds with the skin to continue killing germs even after washing. Please DO NOT use if you have an allergy to CHG or antibacterial soaps.  If your skin becomes reddened/irritated stop using the CHG and inform your nurse when you arrive at Short Stay. Do not shave (including legs and underarms) for at least 48 hours prior to the first CHG shower.  You may shave your face/neck.  Please follow these instructions carefully:  1.  Shower with CHG Soap the night before surgery ONLY (DO NOT USE THE SOAP THE MORNING OF SURGERY).  2.  If you choose to wash your hair, wash your hair first as usual with your normal  shampoo.  3.  After you shampoo, rinse your hair and body thoroughly to remove the shampoo.                             4.  Use CHG as you would any other liquid soap.  You can apply chg directly to the skin and wash.  Gently with a scrungie or clean washcloth.  5.  Apply the CHG Soap to your body ONLY FROM THE NECK DOWN.   Do   not use on face/ open  Wound or open sores. Avoid contact with eyes, ears mouth and   genitals (private parts).                       Wash face,  Genitals (private parts) with your normal soap.             6.  Wash thoroughly, paying special attention to the area where your    surgery  will be performed.  7.  Thoroughly rinse your body with warm water from the neck down.  8.  DO NOT shower/wash with your normal soap after using and rinsing off the CHG Soap.                9.  Pat yourself dry with a clean towel.            10.  Wear clean pajamas.            11.  Place clean sheets on your bed the night of your first shower and do not  sleep with pets. Day of Surgery : Do not apply any CHG, lotions/deodorants the  morning of surgery.  Please wear clean clothes to the hospital/surgery center.  FAILURE TO FOLLOW THESE INSTRUCTIONS MAY RESULT IN THE CANCELLATION OF YOUR SURGERY  PATIENT SIGNATURE_________________________________  NURSE SIGNATURE__________________________________  ________________________________________________________________________

## 2024-01-11 ENCOUNTER — Encounter (HOSPITAL_COMMUNITY): Payer: Self-pay

## 2024-01-14 ENCOUNTER — Encounter (HOSPITAL_COMMUNITY): Payer: Self-pay | Admitting: Medical

## 2024-01-14 ENCOUNTER — Encounter: Admission: RE | Disposition: A | Payer: Self-pay | Attending: Surgery

## 2024-01-14 ENCOUNTER — Encounter (HOSPITAL_COMMUNITY): Payer: Self-pay | Admitting: Surgery

## 2024-01-14 ENCOUNTER — Other Ambulatory Visit: Payer: Self-pay

## 2024-01-14 ENCOUNTER — Ambulatory Visit (HOSPITAL_COMMUNITY)
Admission: RE | Admit: 2024-01-14 | Discharge: 2024-01-15 | Disposition: A | Discharge status: Home / Self Care | Source: Ambulatory Visit | Attending: Surgery | Admitting: Surgery

## 2024-01-14 ENCOUNTER — Ambulatory Visit (HOSPITAL_COMMUNITY): Admitting: Anesthesiology

## 2024-01-14 DIAGNOSIS — M1990 Unspecified osteoarthritis, unspecified site: Secondary | ICD-10-CM | POA: Insufficient documentation

## 2024-01-14 DIAGNOSIS — E042 Nontoxic multinodular goiter: Secondary | ICD-10-CM | POA: Diagnosis present

## 2024-01-14 DIAGNOSIS — C73 Malignant neoplasm of thyroid gland: Secondary | ICD-10-CM | POA: Insufficient documentation

## 2024-01-14 DIAGNOSIS — Z79899 Other long term (current) drug therapy: Secondary | ICD-10-CM | POA: Insufficient documentation

## 2024-01-14 DIAGNOSIS — Z8249 Family history of ischemic heart disease and other diseases of the circulatory system: Secondary | ICD-10-CM | POA: Insufficient documentation

## 2024-01-14 DIAGNOSIS — C770 Secondary and unspecified malignant neoplasm of lymph nodes of head, face and neck: Secondary | ICD-10-CM | POA: Insufficient documentation

## 2024-01-14 DIAGNOSIS — K219 Gastro-esophageal reflux disease without esophagitis: Secondary | ICD-10-CM | POA: Insufficient documentation

## 2024-01-14 DIAGNOSIS — K449 Diaphragmatic hernia without obstruction or gangrene: Secondary | ICD-10-CM | POA: Insufficient documentation

## 2024-01-14 DIAGNOSIS — Z87891 Personal history of nicotine dependence: Secondary | ICD-10-CM | POA: Insufficient documentation

## 2024-01-14 DIAGNOSIS — I10 Essential (primary) hypertension: Secondary | ICD-10-CM | POA: Insufficient documentation

## 2024-01-14 DIAGNOSIS — E039 Hypothyroidism, unspecified: Secondary | ICD-10-CM | POA: Insufficient documentation

## 2024-01-14 DIAGNOSIS — Z853 Personal history of malignant neoplasm of breast: Secondary | ICD-10-CM | POA: Insufficient documentation

## 2024-01-14 HISTORY — PX: THYROIDECTOMY, PARTIAL: SHX18

## 2024-01-14 SURGERY — THYROIDECTOMY, SUBTOTAL
Anesthesia: General

## 2024-01-14 MED ORDER — IRBESARTAN 75 MG PO TABS
75.0000 mg | ORAL_TABLET | Freq: Every day | ORAL | Status: DC
  Administered 2024-01-14 – 2024-01-15 (×2): 75 mg via ORAL
  Filled 2024-01-14 (×2): qty 1

## 2024-01-14 MED ORDER — ACETAMINOPHEN 650 MG RE SUPP: 650.0000 mg | Freq: Four times a day (QID) | RECTAL | Status: DC | PRN

## 2024-01-14 MED ORDER — MIDAZOLAM HCL 2 MG/2ML IJ SOLN
INTRAMUSCULAR | Status: AC
  Filled 2024-01-14: qty 2

## 2024-01-14 MED ORDER — CHLORHEXIDINE GLUCONATE 0.12 % MT SOLN
15.0000 mL | Freq: Once | OROMUCOSAL | Status: AC
  Administered 2024-01-14: 15 mL via OROMUCOSAL

## 2024-01-14 MED ORDER — FENTANYL CITRATE (PF) 50 MCG/ML IJ SOSY
PREFILLED_SYRINGE | INTRAMUSCULAR | Status: AC
  Filled 2024-01-14: qty 1

## 2024-01-14 MED ORDER — DEXAMETHASONE SOD PHOSPHATE PF 10 MG/ML IJ SOLN
INTRAMUSCULAR | Status: DC | PRN
  Administered 2024-01-14: 10 mg via INTRAVENOUS

## 2024-01-14 MED ORDER — DROPERIDOL 2.5 MG/ML IJ SOLN: 0.6250 mg | Freq: Once | INTRAMUSCULAR | Status: DC | PRN

## 2024-01-14 MED ORDER — LIDOCAINE HCL (PF) 2 % IJ SOLN
INTRAMUSCULAR | Status: AC
  Filled 2024-01-14: qty 5

## 2024-01-14 MED ORDER — ACETAMINOPHEN 10 MG/ML IV SOLN: 1000.0000 mg | Freq: Once | INTRAVENOUS | Status: DC | PRN

## 2024-01-14 MED ORDER — LEVOTHYROXINE SODIUM 100 MCG PO TABS: 100.0000 ug | ORAL_TABLET | Freq: Every day | ORAL | 3 refills | Status: AC

## 2024-01-14 MED ORDER — MIDAZOLAM HCL (PF) 2 MG/2ML IJ SOLN
INTRAMUSCULAR | Status: DC | PRN
  Administered 2024-01-14: 2 mg via INTRAVENOUS

## 2024-01-14 MED ORDER — FENTANYL CITRATE (PF) 50 MCG/ML IJ SOSY
25.0000 ug | PREFILLED_SYRINGE | INTRAMUSCULAR | Status: DC | PRN
  Administered 2024-01-14 (×3): 50 ug via INTRAVENOUS

## 2024-01-14 MED ORDER — ANASTROZOLE 1 MG PO TABS
1.0000 mg | ORAL_TABLET | Freq: Every day | ORAL | Status: DC
  Filled 2024-01-14: qty 1

## 2024-01-14 MED ORDER — ROCURONIUM BROMIDE 10 MG/ML (PF) SYRINGE
PREFILLED_SYRINGE | INTRAVENOUS | Status: DC | PRN
  Administered 2024-01-14: 10 mg via INTRAVENOUS
  Administered 2024-01-14: 60 mg via INTRAVENOUS
  Administered 2024-01-14: 20 mg via INTRAVENOUS

## 2024-01-14 MED ORDER — ORAL CARE MOUTH RINSE: 15.0000 mL | Freq: Once | OROMUCOSAL | Status: AC

## 2024-01-14 MED ORDER — FENTANYL CITRATE (PF) 50 MCG/ML IJ SOSY
PREFILLED_SYRINGE | INTRAMUSCULAR | Status: AC
  Filled 2024-01-14: qty 2

## 2024-01-14 MED ORDER — PROPOFOL 10 MG/ML IV BOLUS
INTRAVENOUS | Status: DC | PRN
  Administered 2024-01-14: 150 mg via INTRAVENOUS
  Administered 2024-01-14: 50 mg via INTRAVENOUS

## 2024-01-14 MED ORDER — OXYCODONE HCL 5 MG PO TABS: 5.0000 mg | ORAL_TABLET | ORAL | Status: DC | PRN

## 2024-01-14 MED ORDER — SODIUM CHLORIDE 0.45 % IV SOLN: INTRAVENOUS | Status: DC

## 2024-01-14 MED ORDER — ONDANSETRON 4 MG PO TBDP: 4.0000 mg | ORAL_TABLET | Freq: Four times a day (QID) | ORAL | Status: DC | PRN

## 2024-01-14 MED ORDER — SUGAMMADEX SODIUM 200 MG/2ML IV SOLN
INTRAVENOUS | Status: DC | PRN
  Administered 2024-01-14: 200 mg via INTRAVENOUS

## 2024-01-14 MED ORDER — 0.9 % SODIUM CHLORIDE (POUR BTL) OPTIME
TOPICAL | Status: DC | PRN
  Administered 2024-01-14: 1000 mL

## 2024-01-14 MED ORDER — PHENYLEPHRINE 80 MCG/ML (10ML) SYRINGE FOR IV PUSH (FOR BLOOD PRESSURE SUPPORT)
PREFILLED_SYRINGE | INTRAVENOUS | Status: AC
  Filled 2024-01-14: qty 10

## 2024-01-14 MED ORDER — CHLORHEXIDINE GLUCONATE CLOTH 2 % EX PADS: 6.0000 | MEDICATED_PAD | Freq: Once | CUTANEOUS | Status: DC

## 2024-01-14 MED ORDER — PROPRANOLOL HCL ER 80 MG PO CP24
80.0000 mg | ORAL_CAPSULE | Freq: Every day | ORAL | Status: DC
  Administered 2024-01-14: 80 mg via ORAL
  Filled 2024-01-14: qty 1

## 2024-01-14 MED ORDER — VALSARTAN-HYDROCHLOROTHIAZIDE 80-12.5 MG PO TABS: 1.0000 | ORAL_TABLET | Freq: Every day | ORAL | Status: DC

## 2024-01-14 MED ORDER — CALCIUM CARBONATE 1250 (500 CA) MG PO TABS
2.0000 | ORAL_TABLET | Freq: Three times a day (TID) | ORAL | Status: DC
  Administered 2024-01-15: 2500 mg via ORAL
  Filled 2024-01-14: qty 2

## 2024-01-14 MED ORDER — TRAMADOL HCL 50 MG PO TABS: 50.0000 mg | ORAL_TABLET | Freq: Four times a day (QID) | ORAL | Status: DC | PRN

## 2024-01-14 MED ORDER — TRAMADOL HCL 50 MG PO TABS: 50.0000 mg | ORAL_TABLET | Freq: Four times a day (QID) | ORAL | 0 refills | Status: AC | PRN

## 2024-01-14 MED ORDER — PHENYLEPHRINE HCL-NACL 20-0.9 MG/250ML-% IV SOLN
INTRAVENOUS | Status: DC | PRN
  Administered 2024-01-14: 30 ug/min via INTRAVENOUS

## 2024-01-14 MED ORDER — ROCURONIUM BROMIDE 10 MG/ML (PF) SYRINGE
PREFILLED_SYRINGE | INTRAVENOUS | Status: AC
  Filled 2024-01-14: qty 10

## 2024-01-14 MED ORDER — HYDROCHLOROTHIAZIDE 12.5 MG PO TABS
12.5000 mg | ORAL_TABLET | Freq: Every day | ORAL | Status: DC
  Administered 2024-01-14 – 2024-01-15 (×2): 12.5 mg via ORAL
  Filled 2024-01-14 (×2): qty 1

## 2024-01-14 MED ORDER — HEMOSTATIC AGENTS (NO CHARGE) OPTIME
TOPICAL | Status: DC | PRN
  Administered 2024-01-14: 1

## 2024-01-14 MED ORDER — FENTANYL CITRATE (PF) 250 MCG/5ML IJ SOLN
INTRAMUSCULAR | Status: AC
  Filled 2024-01-14: qty 5

## 2024-01-14 MED ORDER — ONDANSETRON HCL 4 MG/2ML IJ SOLN
INTRAMUSCULAR | Status: DC | PRN
  Administered 2024-01-14: 4 mg via INTRAVENOUS

## 2024-01-14 MED ORDER — LACTATED RINGERS IV SOLN: INTRAVENOUS | Status: DC

## 2024-01-14 MED ORDER — PHENYLEPHRINE HCL (PRESSORS) 10 MG/ML IV SOLN
INTRAVENOUS | Status: AC
  Filled 2024-01-14: qty 1

## 2024-01-14 MED ORDER — CIPROFLOXACIN IN D5W 400 MG/200ML IV SOLN
400.0000 mg | INTRAVENOUS | Status: DC
  Filled 2024-01-14: qty 200

## 2024-01-14 MED ORDER — CIPROFLOXACIN IN D5W 400 MG/200ML IV SOLN
INTRAVENOUS | Status: DC | PRN
  Administered 2024-01-14: 400 mg via INTRAVENOUS

## 2024-01-14 MED ORDER — ONDANSETRON HCL 4 MG/2ML IJ SOLN
INTRAMUSCULAR | Status: AC
  Filled 2024-01-14: qty 12

## 2024-01-14 MED ORDER — ACETAMINOPHEN 325 MG PO TABS
650.0000 mg | ORAL_TABLET | Freq: Four times a day (QID) | ORAL | Status: DC | PRN
  Administered 2024-01-14 – 2024-01-15 (×2): 650 mg via ORAL
  Filled 2024-01-14 (×2): qty 2

## 2024-01-14 MED ORDER — PROPOFOL 10 MG/ML IV BOLUS
INTRAVENOUS | Status: AC
  Filled 2024-01-14: qty 20

## 2024-01-14 MED ORDER — CALCIUM CARBONATE ANTACID 500 MG PO CHEW: 2.0000 | CHEWABLE_TABLET | Freq: Three times a day (TID) | ORAL | 1 refills | Status: AC

## 2024-01-14 MED ORDER — PHENYLEPHRINE 80 MCG/ML (10ML) SYRINGE FOR IV PUSH (FOR BLOOD PRESSURE SUPPORT)
PREFILLED_SYRINGE | INTRAVENOUS | Status: DC | PRN
  Administered 2024-01-14: 80 ug via INTRAVENOUS

## 2024-01-14 MED ORDER — HYDROMORPHONE HCL 1 MG/ML IJ SOLN: 1.0000 mg | INTRAMUSCULAR | Status: DC | PRN

## 2024-01-14 MED ORDER — ONDANSETRON HCL 4 MG/2ML IJ SOLN: 4.0000 mg | Freq: Four times a day (QID) | INTRAMUSCULAR | Status: DC | PRN

## 2024-01-14 MED ORDER — PANTOPRAZOLE SODIUM 40 MG PO TBEC
40.0000 mg | DELAYED_RELEASE_TABLET | Freq: Every day | ORAL | Status: DC
  Administered 2024-01-15: 40 mg via ORAL
  Filled 2024-01-14: qty 1

## 2024-01-14 MED ORDER — FENTANYL CITRATE (PF) 100 MCG/2ML IJ SOLN
INTRAMUSCULAR | Status: DC | PRN
  Administered 2024-01-14: 50 ug via INTRAVENOUS
  Administered 2024-01-14: 100 ug via INTRAVENOUS

## 2024-01-14 MED ORDER — LIDOCAINE HCL (CARDIAC) PF 100 MG/5ML IV SOSY
PREFILLED_SYRINGE | INTRAVENOUS | Status: DC | PRN
  Administered 2024-01-14: 60 mg via INTRAVENOUS

## 2024-01-14 MED ORDER — LACTATED RINGERS IV SOLN: INTRAVENOUS | Status: DC | PRN

## 2024-01-14 SURGICAL SUPPLY — 29 items
BAG COUNTER SPONGE SURGICOUNT (BAG) ×1 IMPLANT
BLADE SURG 15 STRL LF DISP TIS (BLADE) ×1 IMPLANT
CHLORAPREP W/TINT 26 (MISCELLANEOUS) ×1 IMPLANT
CLIP TI MEDIUM 6 (CLIP) ×2 IMPLANT
CLIP TI WIDE RED SMALL 6 (CLIP) ×2 IMPLANT
COVER SURGICAL LIGHT HANDLE (MISCELLANEOUS) ×1 IMPLANT
DERMABOND ADVANCED .7 DNX12 (GAUZE/BANDAGES/DRESSINGS) ×1 IMPLANT
DERMABOND ADVANCED .7 DNX6 (GAUZE/BANDAGES/DRESSINGS) IMPLANT
DRAPE LAPAROTOMY T 98X78 PEDS (DRAPES) ×1 IMPLANT
DRAPE UTILITY XL STRL (DRAPES) ×1 IMPLANT
ELECT PENCIL ROCKER SW 15FT (MISCELLANEOUS) ×1 IMPLANT
ELECT REM PT RETURN 15FT ADLT (MISCELLANEOUS) ×1 IMPLANT
GAUZE 4X4 16PLY ~~LOC~~+RFID DBL (SPONGE) ×1 IMPLANT
GLOVE SURG ORTHO 8.0 STRL STRW (GLOVE) ×1 IMPLANT
GOWN STRL REUS W/ TWL XL LVL3 (GOWN DISPOSABLE) ×2 IMPLANT
HEMOSTAT SURGICEL 2X4 FIBR (HEMOSTASIS) ×1 IMPLANT
ILLUMINATOR WAVEGUIDE N/F (MISCELLANEOUS) ×1 IMPLANT
KIT BASIN OR (CUSTOM PROCEDURE TRAY) ×1 IMPLANT
KIT TURNOVER KIT A (KITS) ×1 IMPLANT
PACK BASIC VI WITH GOWN DISP (CUSTOM PROCEDURE TRAY) ×1 IMPLANT
PAD MAGNETIC INSTR ST 16X20 (MISCELLANEOUS) ×1 IMPLANT
SHEARS HARMONIC 9CM CVD (BLADE) ×1 IMPLANT
SUT MNCRL AB 4-0 PS2 18 (SUTURE) ×1 IMPLANT
SUT PROLENE 3 0 PS 2 (SUTURE) IMPLANT
SUT SILK 3 0 SH 30 (SUTURE) ×1 IMPLANT
SUT VIC AB 3-0 SH 18 (SUTURE) ×2 IMPLANT
SYR BULB IRRIG 60ML STRL (SYRINGE) ×1 IMPLANT
TOWEL OR DSP ST BLU DLX 10/PK (DISPOSABLE) ×1 IMPLANT
TUBING CONNECTING 10 (TUBING) ×1 IMPLANT

## 2024-01-14 NOTE — Anesthesia Preprocedure Evaluation (Addendum)
"                                    Anesthesia Evaluation  Patient identified by MRN, date of birth, ID band Patient awake    Reviewed: Allergy & Precautions, NPO status , Patient's Chart, lab work & pertinent test results  Airway Mallampati: II  TM Distance: >3 FB Neck ROM: Full    Dental no notable dental hx.    Pulmonary former smoker   Pulmonary exam normal        Cardiovascular hypertension, Pt. on medications  Rhythm:Regular Rate:Normal     Neuro/Psych negative neurological ROS  negative psych ROS   GI/Hepatic Neg liver ROS, hiatal hernia,GERD  Medicated,,  Endo/Other  Hypothyroidism    Renal/GU negative Renal ROS  negative genitourinary   Musculoskeletal  (+) Arthritis , Osteoarthritis,    Abdominal Normal abdominal exam  (+)   Peds  Hematology  (+) Blood dyscrasia, anemia   Anesthesia Other Findings   Reproductive/Obstetrics                              Anesthesia Physical Anesthesia Plan  ASA: 2  Anesthesia Plan: General   Post-op Pain Management:    Induction: Intravenous  PONV Risk Score and Plan: 3 and Dexamethasone , Ondansetron  and Treatment may vary due to age or medical condition  Airway Management Planned: Mask and Oral ETT  Additional Equipment: None  Intra-op Plan:   Post-operative Plan: Extubation in OR  Informed Consent: I have reviewed the patients History and Physical, chart, labs and discussed the procedure including the risks, benefits and alternatives for the proposed anesthesia with the patient or authorized representative who has indicated his/her understanding and acceptance.     Dental advisory given  Plan Discussed with: CRNA  Anesthesia Plan Comments:          Anesthesia Quick Evaluation  "

## 2024-01-14 NOTE — Anesthesia Procedure Notes (Signed)
 Procedure Name: Intubation Date/Time: 01/14/2024 2:36 PM  Performed by: Obadiah Reyes BROCKS, CRNAPre-anesthesia Checklist: Patient identified, Emergency Drugs available, Suction available and Patient being monitored Patient Re-evaluated:Patient Re-evaluated prior to induction Oxygen Delivery Method: Circle System Utilized Preoxygenation: Pre-oxygenation with 100% oxygen Induction Type: IV induction Ventilation: Mask ventilation without difficulty Laryngoscope Size: Miller and 2 Grade View: Grade I Tube type: Oral Tube size: 7.0 mm Number of attempts: 1 Airway Equipment and Method: Stylet and Oral airway Placement Confirmation: ETT inserted through vocal cords under direct vision, positive ETCO2 and breath sounds checked- equal and bilateral Secured at: 22 cm Tube secured with: Tape Dental Injury: Teeth and Oropharynx as per pre-operative assessment

## 2024-01-14 NOTE — H&P (Signed)
 "    REFERRING PHYSICIAN: Angelena Roxan SQUIBB, NP  PROVIDER: Deric Bocock OZELL SPINNER, MD   Chief Complaint: New Consultation (Papillary thyroid  carcinoma)  History of Present Illness:  Patient is referred by Roxan Angelena, NP, for surgical evaluation and management of newly diagnosed papillary thyroid  carcinoma. Patient has a longstanding history of thyroid  nodules. She has been followed with sequential ultrasound scanning and has had previous fine-needle aspiration biopsies which have been benign. Patient has been followed for over 10 years. Patient recently noted a more prominent nodule in the right anterior neck. She demonstrated this to her primary care provider and the patient was scheduled for ultrasound examination which was performed in August 2025 at [facility] in [place], [place] . This demonstrated a relatively normal size thyroid  gland with multiple nodules. A 2.4 cm nodule on the right and a 2.0 cm nodule on the left were felt to be suspicious and fine-needle aspiration biopsy was ordered. Patient underwent biopsy of the right sided 2.4 cm nodule. 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 Left nodule was not biopsied. Recent TSH level is normal at 1.01. Patient has had no prior head or neck surgery. She has never been on thyroid  medication. There is no immediate family history of thyroid  disease or other endocrine neoplasm. Patient is accompanied today by her husband. She has a history of breast cancer treated by Dr. Vicenta Poli in this practice approximately 2 years ago. She is followed at the [facility] cancer center by Dr. Odean.  Review of Systems: A complete review of systems was obtained from the patient. I have reviewed this information and discussed as appropriate with the patient. See HPI as well for other ROS.  Review of Systems  Constitutional: Negative.  HENT:  Nodule right neck  Eyes: Negative.   Respiratory: Negative.  Cardiovascular: Negative.  Gastrointestinal: Negative.  Genitourinary: Negative.  Musculoskeletal: Negative.  Skin: Negative.  Neurological: Negative.  Endo/Heme/Allergies: Negative.  Psychiatric/Behavioral: Negative.    Medical History: Past Medical History:  Diagnosis Date  Arthritis  GERD (gastroesophageal reflux disease)  Hypertension  Liver disease   Patient Active Problem List  Diagnosis  Abdominal pain, right upper quadrant  Allergic rhinitis  Anisocoria  Aortic atherosclerosis  Benign neoplasm of colon  BMI 36.0-36.9,adult  Costochondral chest pain  Cutaneous skin tags  Degenerative disc disease, lumbar  Diverticulosis of colon  Enlarged thyroid   Essential hypertension  Family history of breast cancer  Family history of colon cancer  Fatty liver  Gastro-esophageal reflux disease with esophagitis  Encounter for gynecological examination with Papanicolaou smear of cervix  Gastritis  H/O hypokalemia  History of acute pancreatitis  Hyperglycemia  Irritable bowel syndrome  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)  Morbid obesity (CMS-HCC)  Multiple thyroid  nodules  Muscle spasm of back  Osteoarthritis of right knee  Pain in right knee  Prediabetes  Papillary thyroid  carcinoma (CMS/HHS-HCC)   Past Surgical History:  Procedure Laterality Date  BREAST LUMPECTOMY WITH RADIOACTIVE SEED AND SENTINEL LYMPH NODE BIOPSY Left 07/03/2021  Dr Poli  CHOLECYSTECTOMY  JOINT REPLACEMENT    Allergies  Allergen Reactions  Codeine Headache and Other (See Comments)  REACTION: headache Other reaction(s): Headache, Other (see comments)  Amoxicillin  Rash  rash  Famotidine Nausea  pepcid   Current Outpatient Medications on File Prior to Visit  Medication Sig Dispense Refill  ascorbic acid, vitamin C, (VITAMIN C) 100 MG tablet Take 100 mg by mouth once daily  aspirin 81 MG chewable tablet Take by  mouth  cyanocobalamin  (VITAMIN B12) 1000 MCG tablet Take by mouth  docosahexaenoic acid-epa 120-180 mg Cap Take by mouth  fluticasone  propionate (FLONASE ) 50 mcg/actuation nasal spray Place 2 sprays into both nostrils once daily  garlic 400 mg Tab Take by mouth  loratadine (CLARITIN REDITABS) 10 mg dissolvable tablet Take 10 mg by mouth once daily  meloxicam  (MOBIC ) 15 MG tablet Take 15 mg by mouth once daily  multivitamin capsule Take 1 capsule by mouth once daily  pantoprazole  (PROTONIX ) 40 MG DR tablet pantoprazole  40 mg tablet,delayed release  propranoloL  (INNOPRAN  XL) 80 MG XL capsule propranolol  ER 80 mg capsule,24 hr,extended release  pyridoxine, vitamin B6, (B-6) 100 MG tablet Take 100 mg by mouth once daily  valsartan -hydroCHLOROthiazide  (DIOVAN -HCT) 80-12.5 mg tablet valsartan  80 mg-hydrochlorothiazide  12.5 mg tablet  potassium chloride  (KLOR-CON ) 20 MEQ ER tablet Take 1 tablet by mouth once daily   No current facility-administered medications on file prior to visit.   Family History  Problem Relation Age of Onset  Stroke Mother  Skin cancer Mother  Obesity Mother  High blood pressure (Hypertension) Mother  Diabetes Mother  Skin cancer Father  Obesity Sister  High blood pressure (Hypertension) Sister  Diabetes Sister  Colon cancer Sister  Skin cancer Brother  Obesity Brother  High blood pressure (Hypertension) Brother  Colon cancer Brother    Social History   Tobacco Use  Smoking Status Former  Current packs/day: 0.00  Types: Cigarettes  Quit date: 2000  Years since quitting: 25.9  Smokeless Tobacco Never    Social History   Socioeconomic History  Marital status: Married  Tobacco Use  Smoking status: Former  Current packs/day: 0.00  Types: Cigarettes  Quit date: 2000  Years since quitting: 25.9  Smokeless tobacco: Never  Vaping Use  Vaping status: Never Used  Substance and Sexual Activity  Alcohol use: Not Currently  Drug use: Never   Social  Drivers of Corporate Investment Banker Strain: Low Risk (05/11/2022)  Received from [facility]  Overall Financial Resource Strain (CARDIA)  Difficulty of Paying Living Expenses: Not hard at all  Food Insecurity: No Food Insecurity (05/11/2022)  Received from [facility]  Hunger Vital Sign  Within the past 12 months, you worried that your food would run out before you got the money to buy more.: Never true  Within the past 12 months, the food you bought just didn't last and you didn't have money to get more.: Never true  Transportation Needs: No Transportation Needs (05/11/2022)  Received from [facility] - Transportation  Lack of Transportation (Medical): No  Lack of Transportation (Non-Medical): No  Physical Activity: Insufficiently Active (05/11/2022)  Received from [facility]  Exercise Vital Sign  On average, how many days per week do you engage in moderate to strenuous exercise (like a brisk walk)?: 2 days  On average, how many minutes do you engage in exercise at this level?: 10 min  Stress: No Stress Concern Present (05/11/2022)  Received from [facility] - Occupational Stress Questionnaire  Feeling of Stress : Not at all  Social Connections: Socially Integrated (05/11/2022)  Received from [facility]  Social Network  How would you rate your social network (family, work, friends)?: Good participation with social networks  Housing Stability: Unknown (03/26/2023)  Housing Stability Vital Sign  Homeless in the Last Year: No   Objective:   Vitals:  PainSc: 0-No pain  There is no height or weight on file to calculate BMI.  Physical Exam   GENERAL APPEARANCE Comfortable, no acute issues Development: normal Gross deformities: none  SKIN Rash, lesions, ulcers: none Induration, erythema: none Nodules: none palpable  EYES Conjunctiva and lids: normal Pupils: equal  EARS, NOSE, MOUTH, THROAT External ears:  no lesion or deformity External nose: no lesion or deformity Hearing: grossly normal  NECK Symmetric: no Trachea: midline Thyroid : On palpation there is a smooth relatively firm somewhat fixed nodule in the mid right thyroid  lobe measuring at least 2 cm in size, mobile with swallowing, and nontender. There is no palpable nodule in the left side. There is no associated lymphadenopathy.  CHEST/CV Not assessed  ABDOMEN Not assessed  GENITOURINARY/RECTAL Not assessed  MUSCULOSKELETAL Station and gait: normal Digits and nails: no clubbing or cyanosis Muscle strength: grossly normal all extremities Deformity: none  LYMPHATIC Cervical: none palpable Supraclavicular: none palpable  PSYCHIATRIC Oriented to person, place, and time: yes Mood and affect: normal for situation Judgment and insight: appropriate for situation   Assessment and Plan:   Papillary thyroid  carcinoma (CMS/HHS-HCC) Multiple thyroid  nodules  Patient is referred by her primary care provider for surgical evaluation and management of newly diagnosed papillary thyroid  carcinoma.  Patient provided with a copy of The Thyroid  Book: Medical and Surgical Treatment of Thyroid  Problems, published by [employer], 16 pages. Book reviewed and explained to patient during visit today.  Today we reviewed her clinical history. We reviewed her recent ultrasound study. We reviewed the results of her fine-needle aspiration biopsy. We reviewed her recent laboratory studies. Patient has a papillary thyroid  carcinoma arising in the right thyroid  lobe in the setting of multiple thyroid  nodules. I have recommended proceeding with total thyroidectomy with a limited central compartment lymph node dissection. We discussed the procedure. We discussed the size and location of the surgical incision. We discussed the risk and benefits of surgery including the risk of recurrent laryngeal nerve injury and injury to parathyroid glands. We discussed the  hospital stay to be anticipated. We discussed her postoperative recovery. We discussed the need for lifelong thyroid  hormone replacement. We discussed the potential need for radioactive iodine treatment. We discussed the need for consultation with endocrinology postoperatively. The patient understands and wishes to proceed in the near future.  Krystal Spinner, MD [facility] practice Office: [phone]   "

## 2024-01-14 NOTE — Anesthesia Postprocedure Evaluation (Signed)
"   Anesthesia Post Note  Patient: Catherine Navarro  Procedure(s) Performed: THYROIDECTOMY, SUBTOTAL     Patient location during evaluation: PACU Anesthesia Type: General Level of consciousness: awake Pain management: pain level controlled Vital Signs Assessment: post-procedure vital signs reviewed and stable Respiratory status: spontaneous breathing, nonlabored ventilation and respiratory function stable Cardiovascular status: blood pressure returned to baseline and stable Postop Assessment: no apparent nausea or vomiting Anesthetic complications: no   No notable events documented.  Last Vitals:  Vitals:   01/14/24 1745 01/14/24 1826  BP: 136/71 (!) 167/78  Pulse: (!) 53 62  Resp: 12 15  Temp: 36.9 C 36.9 C  SpO2: 96% 95%    Last Pain:  Vitals:   01/14/24 1826  TempSrc: Oral  PainSc: Asleep                 [name]      "

## 2024-01-14 NOTE — Transfer of Care (Signed)
 Immediate Anesthesia Transfer of Care Note  Patient: Catherine Navarro  Procedure(s) Performed: THYROIDECTOMY, SUBTOTAL  Patient Location: PACU  Anesthesia Type:General  Level of Consciousness: awake  Airway & Oxygen Therapy: Patient Spontanous Breathing and Patient connected to nasal cannula oxygen  Post-op Assessment: Report given to RN and Post -op Vital signs reviewed and stable  Post vital signs: Reviewed and stable  Last Vitals:  Vitals Value Taken Time  BP 133/97 01/14/24 16:40  Temp    Pulse 54 01/14/24 16:43  Resp 13 01/14/24 16:43  SpO2 92 % 01/14/24 16:43  Vitals shown include unfiled device data.  Last Pain:  Vitals:   01/14/24 1114  TempSrc:   PainSc: 0-No pain      Patients Stated Pain Goal: 5 (01/14/24 1114)  Complications: No notable events documented.

## 2024-01-14 NOTE — Interval H&P Note (Signed)
 History and Physical Interval Note:  01/14/2024 1:58 PM  Catherine Navarro  has presented today for surgery, with the diagnosis of PAPILLARY THYROID  CARCINOMA.  The various methods of treatment have been discussed with the patient and family. After consideration of risks, benefits and other options for treatment, the patient has consented to    Procedures with comments: THYROIDECTOMY, SUBTOTAL (N/A) - TOTAL THYROIDECTOMY WITH LIMITED LYMPH NODE DISSECTION as a surgical intervention.    The patient's history has been reviewed, patient examined, no change in status, stable for surgery.  I have reviewed the patient's chart and labs.  Questions were answered to the patient's satisfaction.    Krystal Spinner, MD [facility] Surgery [facility] practice Office: [phone]   Krystal Spinner

## 2024-01-14 NOTE — Discharge Instructions (Signed)
 [facility] [place] [facility] - Dr. Krystal Spinner  THYROID  & PARATHYROID SURGERY:  POST-OP INSTRUCTIONS  Always review the instruction sheet provided by the hospital nurse at discharge.  A prescription for pain medication may be sent to your pharmacy at the time of discharge.  Take your pain medication as prescribed.  If narcotic pain medicine is not needed, then you may take acetaminophen  (Tylenol ) or ibuprofen (Advil) as needed for pain or soreness.  Take your normal home medications as prescribed unless otherwise directed.  See the prescriptions for calcium  and thyroid  hormone (levothyroxine ).  Please take those new prescription.  [facility] surgery will determine if/when to adjust those or stop those on your postoperative visit  If you need a refill on your pain medication, please contact the office during regular business hours.  Prescriptions will not be processed by the office after 5:00PM or on weekends.  Start with a light diet upon arrival home, such as soup and crackers or toast.  Be sure to drink plenty of fluids.  Resume your normal diet the day after surgery.  Most patients will experience some swelling and bruising on the chest and neck area.  Ice packs will help for the first 48 hours after arriving home.  Swelling and bruising will take several days to resolve.   It is common to experience some constipation after surgery.  Increasing fluid intake and taking a stool softener (Colace) will usually help to prevent this problem.  A mild laxative (Milk of Magnesia or Miralax ) should be taken according to package directions if there has been no bowel movement after 48 hours.  Dermabond glue covers your incision. This seals the wound and you may shower at any time. The Dermabond will remain in place for about a week.  You may gradually remove the glue when it loosens around the edges.  If you need to loosen the Dermabond for removal, apply a layer of Vaseline to the wound for 15 minutes and then  remove with a Kleenex. Your sutures are under the skin and will not show - they will dissolve on their own.  You may resume light daily activities beginning the day after discharge (such as self-care, walking, climbing stairs), gradually increasing activities as tolerated. You may have sexual intercourse when it is comfortable. Refrain from any heavy lifting or straining until approved by your doctor. You may drive when you no longer are taking prescription pain medication, you can comfortably wear a seatbelt, and you can safely maneuver your car and apply the brakes.  You will see your doctor in the office for a follow-up appointment approximately three weeks after your surgery.  Make sure that you call for this appointment within a day or two after you arrive home to insure a convenient appointment time. Please have any requested laboratory tests performed a few days prior to your office visit so that the results will be available at your follow up appointment.  WHEN TO CALL THE [facility] OFFICE: -- Fever greater than 101.5 -- Inability to urinate -- Nausea and/or vomiting - persistent -- Extreme swelling or bruising -- Continued bleeding from incision -- Increased pain, redness, or drainage from the incision -- Difficulty swallowing or breathing -- Muscle cramping or spasms -- Numbness or tingling in hands or around lips  The clinic staff is available to answer your questions during regular business hours.  Please dont hesitate to call and ask to speak to one of the nurses if you have concerns.  [facility] OFFICE: [phone] (  24 hours)  Please sign up for MyChart accounts. This will allow you to communicate directly with my nurse or myself without having to call the office. It will also allow you to view your test results. You will need to enroll in MyChart for my office ([facility]) and for the hospital [facility]).  Krystal Spinner, MD [facility] A [facility] practice

## 2024-01-14 NOTE — Op Note (Signed)
 Procedure Note  Pre-operative Diagnosis:  Papillary thyroid  carcinoma  Post-operative Diagnosis:  same  Surgeon:  Krystal Spinner, MD  Assistant:  none   Procedure:  Total thyroidectomy with central compartment lymph node dissection  Anesthesia:  General  Estimated Blood Loss:  25 cc  Drains: none         Specimen: thyroid  to pathology; central compartment lymph nodes, Zone VI, to pathology  Indications:  Patient is referred by Roxan Barge, NP, for surgical evaluation and management of newly diagnosed papillary thyroid  carcinoma. Patient has a longstanding history of thyroid  nodules. She has been followed with sequential ultrasound scanning and has had previous fine-needle aspiration biopsies which have been benign. Patient has been followed for over 10 years. Patient recently noted a more prominent nodule in the right anterior neck. She demonstrated this to her primary care provider and the patient was scheduled for ultrasound examination which was performed in August 2025 at [facility] in [place], [place] . This demonstrated a relatively normal size thyroid  gland with multiple nodules. A 2.4 cm nodule on the right and a 2.0 cm nodule on the left were felt to be suspicious and fine-needle aspiration biopsy was ordered. Patient underwent biopsy of the right sided 2.4 cm nodule. 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 Left nodule was not biopsied. Recent TSH level is normal at 1.01. Patient has had no prior head or neck surgery. She has never been on thyroid  medication. There is no immediate family history of thyroid  disease or other endocrine neoplasm. Patient is accompanied today by her husband. She has a history of breast cancer treated by Dr. Vicenta Poli in this practice approximately 2 years ago. She is followed at the [facility] cancer center by Dr. Odean.   Procedure Details: Procedure was done in OR #1 at the [facility]. The patient was brought to the operating room and placed in a supine position on the operating room table. Following administration of general anesthesia, the patient was positioned and then prepped and draped in the usual aseptic fashion. After ascertaining that an adequate level of anesthesia had been achieved, a small Kocher incision was made with #15 blade. Dissection was carried through subcutaneous tissues and platysma.Hemostasis was achieved with the electrocautery. Skin flaps were elevated cephalad and caudad from the thyroid  notch to the sternal notch. A Mahorner self-retaining retractor was placed for exposure. Strap muscles were incised in the midline and dissection was begun on the left side.  Strap muscles were reflected laterally.  Left thyroid  lobe was normal in size and nodular.  The left lobe was gently mobilized with blunt dissection. Superior pole vessels were dissected out and divided individually between small and medium ligaclips with the harmonic scalpel. The thyroid  lobe was rolled anteriorly. Branches of the inferior thyroid  artery were divided between small ligaclips with the harmonic scalpel. Inferior venous tributaries were divided between ligaclips. The recurrent laryngeal nerve was identified and preserved along its course. The ligament of Court was released with the electrocautery and the gland was mobilized onto the anterior trachea. Isthmus was mobilized across the midline. There was a small pyramidal lobe present which was resected with the isthmus. Dry pack was placed in the left neck.  The right thyroid  lobe was gently mobilized with blunt dissection. Right thyroid  lobe was mildly enlarged with a firm mass in the medial mid pole. Superior pole vessels were dissected out and divided between small and medium ligaclips with the Harmonic  scalpel. Superior parathyroid was identified and preserved. Inferior venous tributaries were divided between medium ligaclips with  the harmonic scalpel. The right thyroid  lobe was rolled anteriorly and the branches of the inferior thyroid  artery divided between small ligaclips. The right recurrent laryngeal nerve was identified and preserved along its course.  There was a firm nodular mass in the medial aspect of the mid pole of the right lobe of the thyroid  which was fixed to the underlying trachea.  This was gently mobilized circumferentially utilizing the electrocautery.  The tumor appears to traverse from the anterior surface of the thyroid  to the trachea.  The tumor is resected off of the anterior lateral portion of the second tracheal ring.  A portion of the ring is resected with the tumor.  There was a small pinhole opening at the base of the resection worrisome for possible full-thickness tracheal defect.  It measured about 1 mm in diameter.  It was carefully closed with a figure-of-eight 3-0 Prolene suture.  The remainder the tumor was resected off of the trachea using the electrocautery.  The thyroid  gland was marked with a suture in the right thyroid  lobe.  The entire thyroid  was then submitted to pathology for review.  On gross examination, there is a grossly positive posterior margin on the medial aspect of the right lobe where it had been adherent to the trachea.  Palpation of the operative field demonstrated no evidence of residual disease and no abnormal lymph nodes.  Central compartment tissue is then resected.  Dissection is carried from the carotid artery on the right over the trachea to the carotid artery on the left and inferiorly to the innominate artery.  Tissue was removed en bloc.  Palpation shows no grossly positive lymph nodes.  Tissue was submitted to pathology for review.  Good hemostasis is achieved throughout the field.  The neck was irrigated with warm saline. Fibrillar was placed throughout the operative field. Strap muscles were approximated in the midline with interrupted 3-0 Vicryl sutures. Platysma  was closed with interrupted 3-0 Vicryl sutures. Skin was closed with a running 4-0 Monocryl subcuticular suture. Wound was washed and Dermabond was applied. The patient was awakened from anesthesia and brought to the recovery room. The patient tolerated the procedure well.   Krystal Spinner, MD [facility] Office: [phone]

## 2024-01-15 ENCOUNTER — Encounter (HOSPITAL_COMMUNITY): Payer: Self-pay | Admitting: Surgery

## 2024-01-15 LAB — CALCIUM: Calcium: 9.2 mg/dL (ref 8.9–10.3)

## 2024-01-15 NOTE — Plan of Care (Signed)
" °  Problem: Activity: Goal: Risk for activity intolerance will decrease Outcome: Progressing   Problem: Nutrition: Goal: Adequate nutrition will be maintained Outcome: Adequate for Discharge   Problem: Elimination: Goal: Will not experience complications related to urinary retention Outcome: Completed/Met   Problem: Pain Managment: Goal: General experience of comfort will improve and/or be controlled Outcome: Adequate for Discharge   "

## 2024-01-15 NOTE — Discharge Summary (Addendum)
 Physician Discharge Summary    Catherine Navarro MRN: [id number] DOB/AGE: 1955-05-26 = 68 y.o.  Patient Care Team: System, Provider Not In as PCP - General Catherine Navarro, OD as Referring Physician (Optometry) Odean Potts, MD as Consulting Physician (Hematology and Oncology) Vernetta Berg, MD as Consulting Physician (General Surgery) Dasie Charleston, MD (Radiology) Eletha Boas, MD as Consulting Physician (General Surgery) Pyrtle, Gordy HERO, MD as Consulting Physician (Gastroenterology) Signa Delon LABOR, NP as Nurse Practitioner (Obstetrics and Gynecology)  Admit date: 01/14/2024  Discharge date: 01/15/2024  Hospital Stay = 0 days    Discharge Diagnoses:  Principal Problem:   Papillary thyroid  carcinoma [facility]) Active Problems:   Multiple thyroid  nodules   1 Day Post-Op  01/14/2024  POST-OPERATIVE DIAGNOSIS:   PAPILLARY THYROID  CARCINOMA  SURGERY:  01/14/2024  Procedures: THYROIDECTOMY, SUBTOTAL  SURGEON:    Surgeon(s): Eletha Boas, MD  Consults: Pharmacy, Nutrition, and Anesthesia  Hospital Course:   The patient underwent the surgery above.  Postoperatively, the patient gradually mobilized and advanced to a solid diet.  Pain and other symptoms were treated aggressively.    By the time of discharge, the patient was walking well the hallways, eating food, having flatus.  Pain was well-controlled on an oral medications.  Based on meeting discharge criteria and continuing to recover, I felt it was safe for the patient to be discharged from the hospital to further recover with close followup. Postoperative recommendations were discussed in detail.  Dr. Eletha recommended postoperative calcium  and thyroid  hormone supplementation.  Patient and husband will follow-up next month to the office.  Instructions are written as well.  Discharged Condition: good  Discharge Exam: Blood pressure (!) 155/62, pulse 65, temperature 97.9 F (36.6 C), temperature  source Oral, resp. rate 18, height 5' 6 (1.676 m), weight 108 kg, SpO2 94%.  General: Pt awake/alert/oriented x4 in No acute distress Eyes: PERRL, normal EOM.  Sclera clear.  No icterus Neuro: CN II-XII intact w/o focal sensory/motor deficits. Lymph: No head/neck/groin lymphadenopathy Psych:  No delerium/psychosis/paranoia HENT: Normocephalic, Mucus membranes moist.  No thrush  Neck: Supple, No tracheal deviation anterior neck incision with normal healing ridge.  No hematoma or ecchymosis.  No hoarseness.  Normal speech with good strong voice  Chest: No chest wall pain w good excursion CV:  Pulses intact.  Regular rhythm MS: Normal AROM mjr joints.  No obvious deformity Abdomen: Soft.  Nondistended.  Nontender.  No evidence of peritonitis.  No incarcerated hernias. Ext:  SCDs BLE.  No mjr edema.  No cyanosis Skin: No petechiae / purpura   Disposition:    Follow-up Information     Maczis, Puja Gosai, PA-C. Go on 02/11/2024.   Specialty: General Surgery Why: For wound re-check per Dr Eletha, To follow up after your operation Contact information: [address] [facility] [place] [address] [phone]                 Discharge disposition: 01-Home or Self Care       Discharge Instructions     Call MD for:   Complete by: As directed    FEVER > 101.5 F  (temperatures < 101.5 F are not significant)   Call MD for:  extreme fatigue   Complete by: As directed    Call MD for:  persistant dizziness or light-headedness   Complete by: As directed    Call MD for:  persistant nausea and vomiting   Complete by: As directed  Call MD for:  redness, tenderness, or signs of infection (pain, swelling, redness, odor or green/yellow discharge around incision site)   Complete by: As directed    Call MD for:  severe uncontrolled pain   Complete by: As directed    Diet - low sodium heart healthy   Complete by: As directed    Start with a  bland diet such as soups, liquids, starchy foods, low fat foods, etc. the first few days at home. Gradually advance to a solid, low-fat, high fiber diet by the end of the first week at home.   Add a fiber supplement to your diet (Metamucil, etc) If you feel full, bloated, or constipated, stay on a full liquid or pureed/blenderized diet for a few days until you feel better and are no longer constipated.   Discharge instructions   Complete by: As directed    See Discharge Instructions If you are not getting better after two weeks or are noticing you are getting worse, contact our office [phone] for further advice.  We may need to adjust your medications, re-evaluate you in the office, send you to the emergency room, or see what other things we can do to help. The clinic staff is available to answer your questions during regular business hours (8:30am-5pm).  Please don't hesitate to call and ask to speak to one of our nurses for clinical concerns.    A surgeon from [facility] is always on call at the hospitals 24 hours/day If you have a medical emergency, go to the nearest emergency room or call 911.   Discharge wound care:   Complete by: As directed    It is good for closed incisions and even open wounds to be washed every day.  Shower every day.  Short baths are fine.  Wash the incisions and wounds clean with soap & water.    You may leave closed incisions open to air if it is dry.   You may cover the incision with clean gauze & replace it after your daily shower for comfort.  DERMABOND:  You have purple skin glue (Dermabond) on your incision(s).  Leave them in place, and they will fall off on their own like a scab in 2-3 weeks.  You may trim any edges that curl up with clean scissors.   Driving Restrictions   Complete by: As directed    You may drive when: - you are no longer taking narcotic prescription pain medication - you can comfortably wear a seatbelt - you can safely  make sudden turns/stops without pain.   Increase activity slowly   Complete by: As directed    Start light daily activities --- self-care, walking, climbing stairs- beginning the day after surgery.  Gradually increase activities as tolerated.  Control your pain to be active.  Stop when you are tired.  Ideally, walk several times a day, eventually an hour a day.   Most people are back to most day-to-day activities in a few weeks.  It takes 4-6 weeks to get back to unrestricted, intense activity. If you can walk 30 minutes without difficulty, it is safe to try more intense activity such as jogging, treadmill, bicycling, low-impact aerobics, swimming, etc. Save the most intensive and strenuous activity for last (Usually 4-8 weeks after surgery) such as sit-ups, heavy lifting, contact sports, etc.  Refrain from any intense heavy lifting or straining until you are off narcotics for pain control.  You will have off days, but things  should improve week-by-week. DO NOT PUSH THROUGH PAIN.  Let pain be your guide: If it hurts to do something, don't do it.   Lifting restrictions   Complete by: As directed    If you can walk 30 minutes without difficulty, it is safe to try more intense activity such as jogging, treadmill, bicycling, low-impact aerobics, swimming, etc. Save the most intensive and strenuous activity for last (Usually 4-8 weeks after surgery) such as sit-ups, heavy lifting, contact sports, etc.   Refrain from any intense heavy lifting or straining until you are off narcotics for pain control.  You will have off days, but things should improve week-by-week. DO NOT PUSH THROUGH PAIN.  Let pain be your guide: If it hurts to do something, don't do it.  Pain is your body warning you to avoid that activity for another week until the pain goes down.   May shower / Bathe   Complete by: As directed    May walk up steps   Complete by: As directed    Sexual Activity Restrictions   Complete by: As directed     You may have sexual intercourse when it is comfortable. If it hurts to do something, stop.       Allergies as of 01/15/2024       Reactions   Amoxicillin     rash   Codeine    REACTION: headache   Famotidine Nausea Only   pepcid        Medication List     TAKE these medications    anastrozole  1 MG tablet Commonly known as: ARIMIDEX  TAKE 1 TABLET BY MOUTH DAILY   aspirin 81 MG tablet Take 81 mg by mouth daily.   calcium  carbonate 500 MG chewable tablet Commonly known as: Tums Chew 2 tablets (400 mg of elemental calcium  total) by mouth 3 (three) times daily.   Calcium -D 600-400 MG-UNIT Tabs Take 1 tablet by mouth daily.   cyanocobalamin  1000 MCG tablet Commonly known as: VITAMIN B12 Take 1,000 mcg by mouth daily.   fish oil-omega-3 fatty acids 1000 MG capsule Take 1 g by mouth every other day.   fluticasone  50 MCG/ACT nasal spray Commonly known as: FLONASE  Place 2 sprays into both nostrils daily.   Garlic 400 MG Tabs Take by mouth. 1000 mg daily   levothyroxine  100 MCG tablet Commonly known as: Synthroid  Take 1 tablet (100 mcg total) by mouth daily before breakfast.   loratadine 10 MG dissolvable tablet Commonly known as: CLARITIN REDITABS Take 10 mg by mouth daily.   magnesium oxide 400 (240 Mg) MG tablet Commonly known as: MAG-OX Take 240 mg by mouth daily.   meloxicam  15 MG tablet Commonly known as: MOBIC  Take 1 tablet (15 mg total) by mouth daily as needed for pain. Take 1 tablet by mouth  daily   MULTIVITAMIN PO Take 0.5 tablets by mouth every other day.   nystatin  powder Commonly known as: MYCOSTATIN /NYSTOP  Apply 1 Application topically 2 (two) times daily.   pantoprazole  40 MG tablet Commonly known as: PROTONIX  Take 1 tablet by mouth two  times daily What changed:  how much to take how to take this when to take this   potassium chloride  SA 20 MEQ tablet Commonly known as: KLOR-CON  M Take 1 tablet (20 mEq total) by mouth  daily.   propranolol  ER 80 MG 24 hr capsule Commonly known as: INDERAL  LA TAKE 1 CAPSULE BY MOUTH EVERY DAY What changed: when to take this   pyridOXINE 100  MG tablet Commonly known as: VITAMIN B6 Take 100 mg by mouth daily.   traMADol  50 MG tablet Commonly known as: ULTRAM  Take 1-2 tablets (50-100 mg total) by mouth every 6 (six) hours as needed for moderate pain (pain score 4-6).   valsartan -hydrochlorothiazide  80-12.5 MG tablet Commonly known as: DIOVAN -HCT Take 1 tablet by mouth daily.   vitamin C 100 MG tablet Take 100 mg by mouth daily.   Zinc 100 MG Tabs Take 1 tablet by mouth every other day.               Discharge Care Instructions  (From admission, onward)           Start     Ordered   01/15/24 0000  Discharge wound care:       Comments: It is good for closed incisions and even open wounds to be washed every day.  Shower every day.  Short baths are fine.  Wash the incisions and wounds clean with soap & water.    You may leave closed incisions open to air if it is dry.   You may cover the incision with clean gauze & replace it after your daily shower for comfort.  DERMABOND:  You have purple skin glue (Dermabond) on your incision(s).  Leave them in place, and they will fall off on their own like a scab in 2-3 weeks.  You may trim any edges that curl up with clean scissors.   01/15/24 1011            Significant Diagnostic Studies:  Results for orders placed or performed during the hospital encounter of 01/14/24 (from the past 72 hours)  Calcium      Status: None   Collection Time: 01/15/24  5:17 AM  Result Value Ref Range   Calcium  9.2 8.9 - 10.3 mg/dL    Comment: Performed at [facility], [address], [place], [place] [address]    No results found.  Past Medical History:  Diagnosis Date   Abdominal pain 11/27/2010   Accessory skin tags 01/25/2013   Allergy    Anemia    past hx   Anisocoria    right eye    Breast cancer (HCC) 2023   left breast   Cancer (HCC)    skin cancer removed from nose    Colon polyps 12/22/2010   Tubular Adenoma and Hyperplastic Polyp   Complication of anesthesia    slow to wake up   DDD (degenerative disc disease), cervical    Elevated LFTs 11/27/2010   Family history of breast cancer    Family history of colon cancer    Family history of colon cancer    Fatty infiltration of liver    Gastritis    GERD (gastroesophageal reflux disease)    Hiatal hernia    HTN (hypertension)    Hypothyroidism    with nodules   IBS (irritable bowel syndrome)    Muscle spasm of back 06/04/2015   Osteoarthritis    Pancreatitis    Personal history of radiation therapy    Sinusitis     Past Surgical History:  Procedure Laterality Date   ANAL FISTULECTOMY     BREAST LUMPECTOMY     BREAST LUMPECTOMY WITH RADIOACTIVE SEED AND SENTINEL LYMPH NODE BIOPSY Left 07/03/2021   Procedure: LEFT BREAST LUMPECTOMY WITH RADIOACTIVE SEED AND SENTINEL LYMPH NODE BIOPSY;  Surgeon: Vernetta Berg, MD;  Location: [facility];  Service: General;  Laterality: Left;  LMA  CHOLECYSTECTOMY     COLONOSCOPY     COLONOSCOPY W/ BIOPSIES     ESOPHAGOGASTRODUODENOSCOPY     EYE SURGERY     cataracts   PATELLA RECONSTRUCTION     pins   POLYPECTOMY     REPLACEMENT TOTAL KNEE Right 12/2017   THYROIDECTOMY, PARTIAL N/A 01/14/2024   Procedure: THYROIDECTOMY, SUBTOTAL;  Surgeon: Eletha Boas, MD;  Location: [facility];  Service: General;  Laterality: N/A;  TOTAL THYROIDECTOMY WITH LIMITED LYMPH NODE DISSECTION   TUBAL LIGATION     UPPER GASTROINTESTINAL ENDOSCOPY      Social History   Socioeconomic History   Marital status: Married    Spouse name: BF: Norleen Shove   Number of children: 3   Years of education: 12+   Highest education level: Not on file  Occupational History   Occupation: Adult Nurse: OTHER  Tobacco Use   Smoking status: Former    Current packs/day: 0.00     Types: Cigarettes    Quit date: 01/26/1998    Years since quitting: 25.9    Passive exposure: Past   Smokeless tobacco: Never  Vaping Use   Vaping status: Never Used  Substance and Sexual Activity   Alcohol use: No   Drug use: No   Sexual activity: Yes    Birth control/protection: Post-menopausal, Surgical    Comment: tubal  Other Topics Concern   Not on file  Social History Narrative   Lives alone.Her ex-husband died of heart attack at age 69. 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   One son lives next door, one in [place], [place] and another in [place] .   Social Drivers of Health   Tobacco Use: Medium Risk (01/14/2024)   Patient History    Smoking Tobacco Use: Former    Smokeless Tobacco Use: Never    Passive Exposure: Past  Physicist, Medical Strain: Low Risk (05/11/2022)   Received from [facility]   Overall Financial Resource Strain (CARDIA)    Difficulty of Paying Living Expenses: Not hard at all  Food Insecurity: No Food Insecurity (01/14/2024)   Epic    Worried About Programme Researcher, Broadcasting/film/video in the Last Year: Never true    Ran Out of Food in the Last Year: Never true  Transportation Needs: No Transportation Needs (01/14/2024)   Epic    Lack of Transportation (Medical): No    Lack of Transportation (Non-Medical): No  Physical Activity: Insufficiently Active (05/11/2022)   Received from [facility]   Exercise Vital Sign    On average, how many days per week do you engage in moderate to strenuous exercise (like a brisk walk)?: 2 days    On average, how many minutes do you engage in exercise at this level?: 10 min  Stress: No Stress Concern Present (05/11/2022)   Received from [facility] of Occupational Health - Occupational Stress Questionnaire    Feeling of Stress : Not at all  Social Connections: Moderately Integrated (01/14/2024)   Social Connection and Isolation Panel     Frequency of Communication with Friends and Family: More than three times a week    Frequency of Social Gatherings with Friends and Family: Twice a week    Attends Religious Services: More than 4 times per year    Active Member of [employer] or Organizations: No    Attends Banker Meetings: Never    Marital Status: Married  Catering Manager  Violence: Not At Risk (01/14/2024)   Epic    Fear of Current or Ex-Partner: No    Emotionally Abused: No    Physically Abused: No    Sexually Abused: No  Depression (PHQ2-9): Low Risk (03/09/2022)   Depression (PHQ2-9)    PHQ-2 Score: 0  Alcohol Screen: Low Risk (03/09/2022)   Alcohol Screen    Last Alcohol Screening Score (AUDIT): 0  Housing: Low Risk (01/14/2024)   Epic    Unable to Pay for Housing in the Last Year: No    Number of Times Moved in the Last Year: 0    Homeless in the Last Year: No  Utilities: Not At Risk (01/14/2024)   Epic    Threatened with loss of utilities: No  Health Literacy: Not on file    Family History  Problem Relation Age of Onset   Heart attack Paternal Grandfather    Alzheimer's disease Paternal Grandmother    Diabetes Maternal Grandmother    COPD Father        emphysema   Cancer Father    Diabetes Mother    Irritable bowel syndrome Mother    Stroke Mother    Cancer Brother 62       colon-colorectal colon cancer   Colon cancer Brother    Rectal cancer Brother    Cancer Brother        lung   Hyperlipidemia Brother    Colon polyps Brother    Hypertension Brother    Colon cancer Sister    Liver cancer Sister    Non-Hodgkin's lymphoma Sister    Hyperlipidemia Sister    Hypertension Sister    Colon polyps Sister    Diabetes Sister    Liver disease Sister        fatty liver, cirrhosis   Colon polyps Sister    Kartagener's syndrome Sister    Cirrhosis Sister        non-alcoholic   Heart attack Son 54       rare genetic condition   Breast cancer Maternal Aunt 4   Alzheimer's disease  Maternal Aunt    Non-Hodgkin's lymphoma Paternal Uncle    Non-Hodgkin's lymphoma Niece    Heart disease Neg Hx    Esophageal cancer Neg Hx    Stomach cancer Neg Hx     Current Facility-Administered Medications  Medication Dose Route Frequency Provider Last Rate Last Admin   0.45 % sodium chloride  infusion   Intravenous Continuous Eletha Boas, MD   Stopped at 01/15/24 [phone]   acetaminophen  (TYLENOL ) tablet 650 mg  650 mg Oral Q6H PRN Eletha Boas, MD   650 mg at 01/14/24 1944   Or   acetaminophen  (TYLENOL ) suppository 650 mg  650 mg Rectal Q6H PRN Eletha Boas, MD       anastrozole  (ARIMIDEX ) tablet 1 mg  1 mg Oral Daily Gerkin, Todd, MD       calcium  carbonate (OS-CAL - dosed in mg of elemental calcium ) tablet 2,500 mg  2 tablet Oral TID WC Gerkin, Todd, MD   2,500 mg at 01/15/24 0804   irbesartan  (AVAPRO ) tablet 75 mg  75 mg Oral Daily Carolee Rosaline DASEN, RPH   75 mg at 01/14/24 1845   And   hydrochlorothiazide  (HYDRODIURIL ) tablet 12.5 mg  12.5 mg Oral Daily Carolee Rosaline T, RPH   12.5 mg at 01/14/24 1845   HYDROmorphone  (DILAUDID ) injection 1 mg  1 mg Intravenous Q2H PRN Eletha Boas, MD  ondansetron  (ZOFRAN -ODT) disintegrating tablet 4 mg  4 mg Oral Q6H PRN Eletha Boas, MD       Or   ondansetron  (ZOFRAN ) injection 4 mg  4 mg Intravenous Q6H PRN Eletha Boas, MD       oxyCODONE  (Oxy IR/ROXICODONE ) immediate release tablet 5-10 mg  5-10 mg Oral Q4H PRN Eletha Boas, MD       pantoprazole  (PROTONIX ) EC tablet 40 mg  40 mg Oral Daily Gerkin, Todd, MD       propranolol  ER (INDERAL  LA) 24 hr capsule 80 mg  80 mg Oral QHS Gerkin, Todd, MD   80 mg at 01/14/24 2146   traMADol  (ULTRAM ) tablet 50 mg  50 mg Oral Q6H PRN Eletha Boas, MD         Allergies[1]  Signed:   Elspeth KYM Schultze, MD, FACS, MASCRS Esophageal, Gastrointestinal & Colorectal Surgery Robotic and Minimally Invasive Surgery  [facility] [address] [place], [place] [address] [phone] Fax [phone] Main  CONTACT INFORMATION: Weekday (9AM-5PM): Call [facility] main office at [phone] Weeknight (5PM-9AM) or Weekend/Holiday: Check EPIC Web Links tab & use AMION (password  [email or web address]) for General Surgery [facility] coverage  Please, DO NOT use SecureChat  (it is not reliable communication to reach operating surgeons & will lead to a delay in care).   Epic staff messaging available for outpatient concerns needing 1-2 business day response.      01/15/2024, 10:19 AM       [1]  Allergies Allergen Reactions   Amoxicillin      rash   Codeine     REACTION: headache   Famotidine Nausea Only    pepcid

## 2024-01-15 NOTE — TOC Initial Note (Signed)
 Transition of Care [facility]) - Initial/Assessment Note    Patient Details  Name: Catherine Navarro MRN: [id number] Date of Birth: [birth year]  Transition of Care [facility]) CM/SW Contact:    Sonda Manuella Quill, RN Phone Number: 01/15/2024, 12:22 PM  Clinical Narrative:                 [place] w/ pt and spouse Catherine Navarro in room [phone]); pt lives at home; she plans to return at d/c w/ his support; he will provide transportation; insurance/PCP verified; her PCP is [facility]; she denied SDOH risks; pt does not have DME, HH services, or home oxygen; no IP CM needs.  Expected Discharge Plan: Home/Self Care Barriers to Discharge: No Barriers Identified   Patient Goals and CMS Choice Patient states their goals for this hospitalization and ongoing recovery are:: home          Expected Discharge Plan and Services   Discharge Planning Services: CM Consult   Living arrangements for the past 2 months: Mobile Home Expected Discharge Date: 01/15/24               DME Arranged: N/A DME Agency: NA       HH Arranged: NA HH Agency: NA        Prior Living Arrangements/Services Living arrangements for the past 2 months: Mobile Home Lives with:: Spouse Patient language and need for interpreter reviewed:: Yes Do you feel safe going back to the place where you live?: Yes      Need for Family Participation in Patient Care: Yes (Comment) Care giver support system in place?: Yes (comment) Current home services:  (n/a) Criminal Activity/Legal Involvement Pertinent to Current Situation/Hospitalization: No - Comment as needed  Activities of Daily Living   ADL Screening (condition at time of admission) Independently performs ADLs?: Yes (appropriate for developmental age) Is the patient deaf or have difficulty hearing?: No Does the patient have difficulty seeing, even when wearing glasses/contacts?: No Does the patient have difficulty concentrating,  remembering, or making decisions?: No  Permission Sought/Granted Permission sought to share information with : Case Manager Permission granted to share information with : Yes, Verbal Permission Granted  Share Information with NAME: Case Manager     Permission granted to share info w Relationship: Kazi Montoro (spouse) [phone]     Emotional Assessment Appearance:: Appears stated age Attitude/Demeanor/Rapport: Gracious Affect (typically observed): Accepting Orientation: : Oriented to Self, Oriented to Place, Oriented to  Time, Oriented to Situation Alcohol / Substance Use: Not Applicable Psych Involvement: No (comment)  Admission diagnosis:  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] Patient Active Problem List   Diagnosis Date Noted   Papillary thyroid  carcinoma (HCC) 01/14/2024   History of breast cancer 03/09/2022   Encounter for screening fecal occult blood testing 03/09/2022   Superficial fungus infection of skin 03/09/2022   Genetic testing 07/17/2021   Family history of colon cancer 06/25/2021   Family history of breast cancer 06/25/2021   Malignant neoplasm of upper-outer quadrant of left female breast (HCC) 06/20/2021   Screening for colorectal cancer 02/07/2019   Encounter for gynecological examination with Papanicolaou smear of cervix 02/07/2019   Enlarged thyroid  02/07/2019   Hyperglycemia 12/29/2018   H/O hypokalemia 12/29/2018   Pain in right knee 07/21/2017   Aortic atherosclerosis 01/04/2017   Degenerative disc disease, lumbar 01/04/2017   Multiple thyroid  nodules 06/17/2016   Muscle spasm of back 06/04/2015   Cutaneous skin tags 04/12/2014  BMI 36.0-36.9,adult 08/01/2013   Anisocoria 07/26/2012   Family history of malignant neoplasm of gastrointestinal tract 12/22/2010   Diverticulosis of colon 12/22/2010   Benign neoplasm of colon 12/22/2010   Fatty liver 11/27/2010   Costochondral chest pain 11/27/2010    GERD (gastroesophageal reflux disease) 11/27/2010   HELICOBACTER PYLORI GASTRITIS, HX OF 05/02/2009   ABDOMINAL PAIN-RUQ 07/12/2007   Essential hypertension 03/23/2007   Allergic rhinitis 03/23/2007   IBS 03/23/2007   Osteoarthritis of right knee 03/23/2007   H/O non anemic vitamin B12 deficiency 03/23/2007   History of acute pancreatitis 03/23/2007   Gastritis and gastroduodenitis 12/21/2005   Diaphragmatic hernia 12/21/2005   PCP:  System, Provider Not In Pharmacy:   [facility] Delivery) - [place] - [address] [facility] [address] [place] [address] [place] [address] Phone: [phone] Fax: [phone]  [facility] Delivery - [place], [place] - [address] [place]  [address] Phone: [phone] Fax: [phone]  [facility] - [place], [place] - [address] [facility] [address] [place] [address] Phone: [phone] Fax: [phone]     Social Drivers of Health (SDOH) Social History: SDOH Screenings   Food Insecurity: No Food Insecurity (01/15/2024)  Housing: Low Risk (01/15/2024)  Transportation Needs: No Transportation Needs (01/15/2024)  Utilities: Not At Risk (01/15/2024)  Alcohol Screen: Low Risk (03/09/2022)  Depression (PHQ2-9): Low Risk (03/09/2022)  Financial Resource Strain: Low Risk (05/11/2022)   Received from [facility]  Physical Activity: Insufficiently Active (05/11/2022)   Received from [facility]  Social Connections: Moderately Integrated (01/14/2024)  Stress: No Stress Concern Present (05/11/2022)   Received from [facility]  Tobacco Use: Medium Risk (01/14/2024)   SDOH Interventions: Food Insecurity Interventions: Intervention Not Indicated, Inpatient TOC Housing Interventions: Intervention Not Indicated, Inpatient TOC Transportation Interventions: Intervention Not Indicated, Inpatient TOC Utilities Interventions: Intervention Not Indicated,  Inpatient TOC   Readmission Risk Interventions     No data to display

## 2024-01-15 NOTE — Plan of Care (Signed)
  Problem: Education: Goal: Knowledge of General Education information will improve Description: Including pain rating scale, medication(s)/side effects and non-pharmacologic comfort measures Outcome: Adequate for Discharge   Problem: Health Behavior/Discharge Planning: Goal: Ability to manage health-related needs will improve Outcome: Adequate for Discharge   Problem: Clinical Measurements: Goal: Ability to maintain clinical measurements within normal limits will improve Outcome: Adequate for Discharge Goal: Will remain free from infection Outcome: Adequate for Discharge Goal: Diagnostic test results will improve Outcome: Adequate for Discharge Goal: Respiratory complications will improve Outcome: Adequate for Discharge Goal: Cardiovascular complication will be avoided Outcome: Adequate for Discharge   Problem: Activity: Goal: Risk for activity intolerance will decrease Outcome: Adequate for Discharge   Problem: Nutrition: Goal: Adequate nutrition will be maintained Outcome: Adequate for Discharge   Problem: Coping: Goal: Level of anxiety will decrease Outcome: Adequate for Discharge   Problem: Elimination: Goal: Will not experience complications related to bowel motility Outcome: Adequate for Discharge   Problem: Pain Managment: Goal: General experience of comfort will improve and/or be controlled Outcome: Adequate for Discharge   Problem: Safety: Goal: Ability to remain free from injury will improve Outcome: Adequate for Discharge   Problem: Skin Integrity: Goal: Risk for impaired skin integrity will decrease Outcome: Adequate for Discharge

## 2024-01-15 NOTE — Progress Notes (Signed)
 Assessment unchanged. Pt verbalized understanding of dc instructions including medications to resume, follow up care and when to call the MD office. Dc via wc to front entrance accompanied by NT.

## 2024-01-16 ENCOUNTER — Inpatient Hospital Stay (HOSPITAL_COMMUNITY)

## 2024-01-16 ENCOUNTER — Encounter (HOSPITAL_COMMUNITY): Payer: Self-pay

## 2024-01-16 ENCOUNTER — Inpatient Hospital Stay (HOSPITAL_COMMUNITY)
Admission: AD | Admit: 2024-01-16 | Discharge: 2024-01-17 | DRG: 981 | Disposition: A | Discharge status: Other Acute Inpatient Hospital | Attending: Pulmonary Disease | Admitting: Pulmonary Disease

## 2024-01-16 DIAGNOSIS — Z833 Family history of diabetes mellitus: Secondary | ICD-10-CM

## 2024-01-16 DIAGNOSIS — Y838 Other surgical procedures as the cause of abnormal reaction of the patient, or of later complication, without mention of misadventure at the time of the procedure: Secondary | ICD-10-CM | POA: Diagnosis present

## 2024-01-16 DIAGNOSIS — Z85828 Personal history of other malignant neoplasm of skin: Secondary | ICD-10-CM

## 2024-01-16 DIAGNOSIS — T8182XA Emphysema (subcutaneous) resulting from a procedure, initial encounter: Secondary | ICD-10-CM | POA: Diagnosis present

## 2024-01-16 DIAGNOSIS — C73 Malignant neoplasm of thyroid gland: Secondary | ICD-10-CM | POA: Diagnosis present

## 2024-01-16 DIAGNOSIS — Z87891 Personal history of nicotine dependence: Secondary | ICD-10-CM | POA: Diagnosis not present

## 2024-01-16 DIAGNOSIS — Z79890 Hormone replacement therapy (postmenopausal): Secondary | ICD-10-CM | POA: Diagnosis not present

## 2024-01-16 DIAGNOSIS — Z860102 Personal history of hyperplastic colon polyps: Secondary | ICD-10-CM

## 2024-01-16 DIAGNOSIS — Z7401 Bed confinement status: Secondary | ICD-10-CM | POA: Diagnosis not present

## 2024-01-16 DIAGNOSIS — J9572 Accidental puncture and laceration of a respiratory system organ or structure during other procedure: Principal | ICD-10-CM | POA: Diagnosis present

## 2024-01-16 DIAGNOSIS — Z9049 Acquired absence of other specified parts of digestive tract: Secondary | ICD-10-CM

## 2024-01-16 DIAGNOSIS — M5134 Other intervertebral disc degeneration, thoracic region: Secondary | ICD-10-CM | POA: Diagnosis present

## 2024-01-16 DIAGNOSIS — Z853 Personal history of malignant neoplasm of breast: Secondary | ICD-10-CM | POA: Diagnosis not present

## 2024-01-16 DIAGNOSIS — Z9009 Acquired absence of other part of head and neck: Secondary | ICD-10-CM | POA: Diagnosis present

## 2024-01-16 DIAGNOSIS — Z9889 Other specified postprocedural states: Secondary | ICD-10-CM | POA: Diagnosis present

## 2024-01-16 DIAGNOSIS — I700 Atherosclerosis of aorta: Secondary | ICD-10-CM | POA: Diagnosis present

## 2024-01-16 DIAGNOSIS — Z825 Family history of asthma and other chronic lower respiratory diseases: Secondary | ICD-10-CM

## 2024-01-16 DIAGNOSIS — Z807 Family history of other malignant neoplasms of lymphoid, hematopoietic and related tissues: Secondary | ICD-10-CM

## 2024-01-16 DIAGNOSIS — Z7982 Long term (current) use of aspirin: Secondary | ICD-10-CM | POA: Diagnosis not present

## 2024-01-16 DIAGNOSIS — R6889 Other general symptoms and signs: Secondary | ICD-10-CM | POA: Diagnosis not present

## 2024-01-16 DIAGNOSIS — J9601 Acute respiratory failure with hypoxia: Secondary | ICD-10-CM | POA: Diagnosis present

## 2024-01-16 DIAGNOSIS — Z79899 Other long term (current) drug therapy: Secondary | ICD-10-CM

## 2024-01-16 DIAGNOSIS — Z803 Family history of malignant neoplasm of breast: Secondary | ICD-10-CM

## 2024-01-16 DIAGNOSIS — I10 Essential (primary) hypertension: Secondary | ICD-10-CM | POA: Diagnosis present

## 2024-01-16 DIAGNOSIS — J309 Allergic rhinitis, unspecified: Secondary | ICD-10-CM | POA: Diagnosis present

## 2024-01-16 DIAGNOSIS — Z96651 Presence of right artificial knee joint: Secondary | ICD-10-CM | POA: Diagnosis present

## 2024-01-16 DIAGNOSIS — Z880 Allergy status to penicillin: Secondary | ICD-10-CM | POA: Diagnosis not present

## 2024-01-16 DIAGNOSIS — Z8249 Family history of ischemic heart disease and other diseases of the circulatory system: Secondary | ICD-10-CM

## 2024-01-16 DIAGNOSIS — C770 Secondary and unspecified malignant neoplasm of lymph nodes of head, face and neck: Secondary | ICD-10-CM | POA: Diagnosis present

## 2024-01-16 DIAGNOSIS — Z923 Personal history of irradiation: Secondary | ICD-10-CM | POA: Diagnosis not present

## 2024-01-16 DIAGNOSIS — K589 Irritable bowel syndrome without diarrhea: Secondary | ICD-10-CM | POA: Diagnosis present

## 2024-01-16 DIAGNOSIS — Z800 Family history of malignant neoplasm of digestive organs: Secondary | ICD-10-CM

## 2024-01-16 DIAGNOSIS — F32A Depression, unspecified: Secondary | ICD-10-CM | POA: Diagnosis present

## 2024-01-16 DIAGNOSIS — Z860101 Personal history of adenomatous and serrated colon polyps: Secondary | ICD-10-CM

## 2024-01-16 DIAGNOSIS — Z808 Family history of malignant neoplasm of other organs or systems: Secondary | ICD-10-CM

## 2024-01-16 DIAGNOSIS — Z885 Allergy status to narcotic agent status: Secondary | ICD-10-CM | POA: Diagnosis not present

## 2024-01-16 DIAGNOSIS — R7303 Prediabetes: Secondary | ICD-10-CM | POA: Diagnosis present

## 2024-01-16 DIAGNOSIS — K2100 Gastro-esophageal reflux disease with esophagitis, without bleeding: Secondary | ICD-10-CM | POA: Diagnosis present

## 2024-01-16 DIAGNOSIS — T819XXA Unspecified complication of procedure, initial encounter: Secondary | ICD-10-CM | POA: Diagnosis not present

## 2024-01-16 DIAGNOSIS — Z743 Need for continuous supervision: Secondary | ICD-10-CM | POA: Diagnosis not present

## 2024-01-16 DIAGNOSIS — E039 Hypothyroidism, unspecified: Secondary | ICD-10-CM | POA: Diagnosis not present

## 2024-01-16 DIAGNOSIS — Z823 Family history of stroke: Secondary | ICD-10-CM

## 2024-01-16 DIAGNOSIS — Z83719 Family history of colon polyps, unspecified: Secondary | ICD-10-CM

## 2024-01-16 DIAGNOSIS — Z83438 Family history of other disorder of lipoprotein metabolism and other lipidemia: Secondary | ICD-10-CM

## 2024-01-16 DIAGNOSIS — R221 Localized swelling, mass and lump, neck: Secondary | ICD-10-CM

## 2024-01-16 DIAGNOSIS — K219 Gastro-esophageal reflux disease without esophagitis: Secondary | ICD-10-CM | POA: Diagnosis not present

## 2024-01-16 DIAGNOSIS — Z820 Family history of epilepsy and other diseases of the nervous system: Secondary | ICD-10-CM

## 2024-01-16 DIAGNOSIS — Z6839 Body mass index (BMI) 39.0-39.9, adult: Secondary | ICD-10-CM

## 2024-01-16 DIAGNOSIS — R918 Other nonspecific abnormal finding of lung field: Secondary | ICD-10-CM | POA: Diagnosis not present

## 2024-01-16 LAB — GLUCOSE, CAPILLARY
Glucose-Capillary: 183 mg/dL — ABNORMAL HIGH (ref 70–99)
Glucose-Capillary: 155 mg/dL — ABNORMAL HIGH (ref 70–99)
Glucose-Capillary: 124 mg/dL — ABNORMAL HIGH (ref 70–99)
Glucose-Capillary: 118 mg/dL — ABNORMAL HIGH (ref 70–99)
Glucose-Capillary: 126 mg/dL — ABNORMAL HIGH (ref 70–99)

## 2024-01-16 LAB — CREATININE, SERUM
Creatinine, Ser: 0.75 mg/dL (ref 0.44–1.00)
GFR, Estimated: >60 mL/min

## 2024-01-16 LAB — HIV ANTIBODY (ROUTINE TESTING W REFLEX): HIV Screen 4th Generation wRfx: NONREACTIVE

## 2024-01-16 LAB — HEMOGLOBIN A1C
Hgb A1c MFr Bld: 6 % — ABNORMAL HIGH (ref 4.8–5.6)
Mean Plasma Glucose: 125.5 mg/dL

## 2024-01-16 LAB — MRSA NEXT GEN BY PCR, NASAL: MRSA by PCR Next Gen: NOT DETECTED

## 2024-01-16 MED ORDER — HYDRALAZINE HCL 20 MG/ML IJ SOLN: 10.0000 mg | INTRAMUSCULAR | Status: DC | PRN

## 2024-01-16 MED ORDER — ORAL CARE MOUTH RINSE: 15.0000 mL | OROMUCOSAL | Status: DC | PRN

## 2024-01-16 MED ORDER — INSULIN ASPART 100 UNIT/ML IJ SOLN
0.0000 [IU] | INTRAMUSCULAR | Status: DC
  Administered 2024-01-16 (×3): 2 [IU] via SUBCUTANEOUS
  Filled 2024-01-16 (×3): qty 2

## 2024-01-16 MED ORDER — POTASSIUM CHLORIDE IN NACL 20-0.45 MEQ/L-% IV SOLN
INTRAVENOUS | Status: AC
  Filled 2024-01-16 (×2): qty 1000

## 2024-01-16 MED ORDER — FENTANYL BOLUS VIA INFUSION
25.0000 ug | INTRAVENOUS | Status: DC | PRN
  Administered 2024-01-16 (×3): 25 ug via INTRAVENOUS
  Administered 2024-01-17 (×2): 50 ug via INTRAVENOUS

## 2024-01-16 MED ORDER — PANTOPRAZOLE SODIUM 40 MG IV SOLR
40.0000 mg | Freq: Every day | INTRAVENOUS | Status: DC
  Administered 2024-01-16: 40 mg via INTRAVENOUS
  Filled 2024-01-16: qty 10

## 2024-01-16 MED ORDER — IOHEXOL 350 MG/ML SOLN
75.0000 mL | Freq: Once | INTRAVENOUS | Status: AC | PRN
  Administered 2024-01-16: 75 mL via INTRAVENOUS

## 2024-01-16 MED ORDER — FENTANYL CITRATE (PF) 50 MCG/ML IJ SOSY
25.0000 ug | PREFILLED_SYRINGE | Freq: Once | INTRAMUSCULAR | Status: AC
  Administered 2024-01-16: 50 ug via INTRAVENOUS

## 2024-01-16 MED ORDER — ORAL CARE MOUTH RINSE
15.0000 mL | OROMUCOSAL | Status: DC
  Administered 2024-01-16 – 2024-01-17 (×17): 15 mL via OROMUCOSAL

## 2024-01-16 MED ORDER — CHLORHEXIDINE GLUCONATE CLOTH 2 % EX PADS
6.0000 | MEDICATED_PAD | Freq: Every day | CUTANEOUS | Status: DC
  Administered 2024-01-16 – 2024-01-17 (×2): 6 via TOPICAL

## 2024-01-16 MED ORDER — PROPOFOL 1000 MG/100ML IV EMUL
0.0000 ug/kg/min | INTRAVENOUS | Status: DC
  Administered 2024-01-16 – 2024-01-17 (×7): 20 ug/kg/min via INTRAVENOUS
  Filled 2024-01-16 (×7): qty 100

## 2024-01-16 MED ORDER — FENTANYL 2500MCG IN NS 250ML (10MCG/ML) PREMIX INFUSION
0.0000 ug/h | INTRAVENOUS | Status: DC
  Administered 2024-01-16: 50 ug/h via INTRAVENOUS
  Filled 2024-01-16: qty 250

## 2024-01-16 NOTE — Progress Notes (Signed)
 PCCM Progress Note  Earlier this afternoon surgery and CCM team updated family (both sons) on CT imaging involving the subcutaneous gas in neck and face and pneumomediastinum. Plan is to continue to watch with follow-up imaging as clinically indicated. No urgent surgical intervention at this time. Will remain on vent support for airway protection. Will pull ETT 2 cm. Repeat CXR tonight.

## 2024-01-16 NOTE — H&P (Signed)
 Catherine Navarro is an 68 y.o. female.   Chief Complaint: neck and facial swelling HPI: 68yo F with PMHx papillary thyroid  cancer and breast cancer underwent total thyroidectomy with central compartment LN dissection by Dr. Eletha 12/19 at [facility]. She was discharged 12/20. After going to bed around 8pm, she developed neck and facial swelling. She lives in [place]. SHe was taken to [facility] in [place], [place]. She was given dexamethasone  and benadryl. SHe was maintaining her airway but then coughed up some old blood. The physician there spoke with my partner who is on call at [facility] and he accepted her in transfer. She was intubated prior to transfer to secure her airway. There are no ICU beds available at [facility] so she came to [facility]. Her husband has arrived heree and assists with history.  Past Medical History:  Diagnosis Date   Abdominal pain 11/27/2010   Accessory skin tags 01/25/2013   Allergy    Anemia    past hx   Anisocoria    right eye   Breast cancer (HCC) 2023   left breast   Cancer (HCC)    skin cancer removed from nose    Colon polyps 12/22/2010   Tubular Adenoma and Hyperplastic Polyp   Complication of anesthesia    slow to wake up   DDD (degenerative disc disease), cervical    Elevated LFTs 11/27/2010   Family history of breast cancer    Family history of colon cancer    Family history of colon cancer    Fatty infiltration of liver    Gastritis    GERD (gastroesophageal reflux disease)    Hiatal hernia    HTN (hypertension)    Hypothyroidism    with nodules   IBS (irritable bowel syndrome)    Muscle spasm of back 06/04/2015   Osteoarthritis    Pancreatitis    Personal history of radiation therapy    Sinusitis     Past Surgical History:  Procedure Laterality Date   ANAL FISTULECTOMY     BREAST LUMPECTOMY     BREAST LUMPECTOMY WITH RADIOACTIVE SEED AND SENTINEL LYMPH NODE BIOPSY Left 07/03/2021   Procedure: LEFT BREAST LUMPECTOMY WITH RADIOACTIVE  SEED AND SENTINEL LYMPH NODE BIOPSY;  Surgeon: Vernetta Berg, MD;  Location: [facility];  Service: General;  Laterality: Left;  LMA   CHOLECYSTECTOMY     COLONOSCOPY     COLONOSCOPY W/ BIOPSIES     ESOPHAGOGASTRODUODENOSCOPY     EYE SURGERY     cataracts   PATELLA RECONSTRUCTION     pins   POLYPECTOMY     REPLACEMENT TOTAL KNEE Right 12/2017   THYROIDECTOMY, PARTIAL N/A 01/14/2024   Procedure: THYROIDECTOMY, SUBTOTAL;  Surgeon: Eletha Boas, MD;  Location: [facility];  Service: General;  Laterality: N/A;  TOTAL THYROIDECTOMY WITH LIMITED LYMPH NODE DISSECTION   TUBAL LIGATION     UPPER GASTROINTESTINAL ENDOSCOPY      Family History  Problem Relation Age of Onset   Heart attack Paternal Grandfather    Alzheimer's disease Paternal Grandmother    Diabetes Maternal Grandmother    COPD Father        emphysema   Cancer Father    Diabetes Mother    Irritable bowel syndrome Mother    Stroke Mother    Cancer Brother 30       colon-colorectal colon cancer   Colon cancer Brother    Rectal cancer Brother    Cancer Brother  lung   Hyperlipidemia Brother    Colon polyps Brother    Hypertension Brother    Colon cancer Sister    Liver cancer Sister    Non-Hodgkin's lymphoma Sister    Hyperlipidemia Sister    Hypertension Sister    Colon polyps Sister    Diabetes Sister    Liver disease Sister        fatty liver, cirrhosis   Colon polyps Sister    Kartagener's syndrome Sister    Cirrhosis Sister        non-alcoholic   Heart attack Son 58       rare genetic condition   Breast cancer Maternal Aunt 59   Alzheimer's disease Maternal Aunt    Non-Hodgkin's lymphoma Paternal Uncle    Non-Hodgkin's lymphoma Niece    Heart disease Neg Hx    Esophageal cancer Neg Hx    Stomach cancer Neg Hx    Social History:  reports that she quit smoking about 25 years ago. Her smoking use included cigarettes. She has been exposed to tobacco smoke. She has never used smokeless  tobacco. She reports that she does not drink alcohol and does not use drugs.  Allergies: Allergies[1]  Medications Prior to Admission  Medication Sig Dispense Refill   anastrozole  (ARIMIDEX ) 1 MG tablet TAKE 1 TABLET BY MOUTH DAILY 100 tablet 2   Ascorbic Acid (VITAMIN C) 100 MG tablet Take 100 mg by mouth daily.     aspirin 81 MG tablet Take 81 mg by mouth daily.     calcium  carbonate (TUMS) 500 MG chewable tablet Chew 2 tablets (400 mg of elemental calcium  total) by mouth 3 (three) times daily. 120 tablet 1   Calcium  Carbonate-Vitamin D (CALCIUM -D) 600-400 MG-UNIT TABS Take 1 tablet by mouth daily.     fish oil-omega-3 fatty acids 1000 MG capsule Take 1 g by mouth every other day.     fluticasone  (FLONASE ) 50 MCG/ACT nasal spray Place 2 sprays into both nostrils daily. 48 g 3   Garlic 400 MG TABS Take by mouth. 1000 mg daily     levothyroxine  (SYNTHROID ) 100 MCG tablet Take 1 tablet (100 mcg total) by mouth daily before breakfast. 30 tablet 3   loratadine (CLARITIN REDITABS) 10 MG dissolvable tablet Take 10 mg by mouth daily.     magnesium oxide (MAG-OX) 400 (240 Mg) MG tablet Take 240 mg by mouth daily.     meloxicam  (MOBIC ) 15 MG tablet Take 1 tablet (15 mg total) by mouth daily as needed for pain. Take 1 tablet by mouth  daily 90 tablet 1   Multiple Vitamin (MULTIVITAMIN PO) Take 0.5 tablets by mouth every other day.     nystatin  (MYCOSTATIN /NYSTOP ) powder Apply 1 Application topically 2 (two) times daily. 60 g 2   pantoprazole  (PROTONIX ) 40 MG tablet Take 1 tablet by mouth two  times daily (Patient taking differently: Take 40 mg by mouth daily. Take 1 tablet by mouth two  times daily) 180 tablet 3   potassium chloride  SA (KLOR-CON ) 20 MEQ tablet Take 1 tablet (20 mEq total) by mouth daily. (Patient not taking: Reported on 01/10/2024) 90 tablet 1   propranolol  ER (INDERAL  LA) 80 MG 24 hr capsule TAKE 1 CAPSULE BY MOUTH EVERY DAY (Patient taking differently: at bedtime. TAKE 1 CAPSULE BY  MOUTH EVERY DAY) 90 capsule 1   pyridOXINE (VITAMIN B-6) 100 MG tablet Take 100 mg by mouth daily.     traMADol  (ULTRAM ) 50 MG tablet Take 1-2  tablets (50-100 mg total) by mouth every 6 (six) hours as needed for moderate pain (pain score 4-6). 15 tablet 0   valsartan -hydrochlorothiazide  (DIOVAN -HCT) 80-12.5 MG tablet Take 1 tablet by mouth daily. 90 tablet 1   vitamin B-12 (CYANOCOBALAMIN ) 1000 MCG tablet Take 1,000 mcg by mouth daily.     Zinc 100 MG TABS Take 1 tablet by mouth every other day.      Results for orders placed or performed during the hospital encounter of 01/16/24 (from the past 48 hours)  Glucose, capillary     Status: Abnormal   Collection Time: 01/16/24  3:52 AM  Result Value Ref Range   Glucose-Capillary 183 (H) 70 - 99 mg/dL    Comment: Glucose reference range applies only to samples taken after fasting for at least 8 hours.   No results found.  Review of Systems  Blood pressure (!) 143/90, pulse 70, temperature 98.7 F (37.1 C), temperature source Axillary, resp. rate (!) 25, height 5' 5.98 (1.676 m), weight 110.8 kg, SpO2 97%. Physical Exam Constitutional:      Comments: Sedated on vent  Eyes:     General: No scleral icterus.    Pupils: Pupils are equal, round, and reactive to light.  Neck:     Comments: Anterior neck incision CDI, edema above this to chin and sides of face B Cardiovascular:     Rate and Rhythm: Normal rate and regular rhythm.  Pulmonary:     Comments: CTA on vent Abdominal:     General: Abdomen is flat.     Palpations: Abdomen is soft.     Tenderness: There is no abdominal tenderness.  Musculoskeletal:        General: No swelling.  Neurological:     Comments: sedated      Assessment/Plan S/P total thyroidectomy with central compartment LN dissection by Dr. Eletha 12/19  Post-op neck and facial swelling - management at outside hospital as above, husband reports he thinks she had a CT at the hospital in [place]. I do not have a  report of that and I cannot see anything in Care Everywhere. Will obtain CT angio neck for further evaluation Acute ventilator dependent respiratory failure - initial orders placed, I consulted CCM to assist in management. Appreciate their help.  WIll get CXR now. Home meds held as no enteral access. I spoke with her husband at the bedside.  Dann FORBES Hummer, MD 01/16/2024, 4:37 AM       [1]  Allergies Allergen Reactions   Amoxicillin      rash   Codeine     REACTION: headache   Famotidine Nausea Only    pepcid

## 2024-01-16 NOTE — Progress Notes (Addendum)
 eLink Physician-Brief Progress Note Patient Name: Catherine Navarro DOB: 07/26/1955 MRN: [id number]   Date of Service  01/16/2024  HPI/Events of Note  68 year old with a history of gastroesophageal reflux disease, hypertension and liver disease who presented for surgical evaluation of papillary thyroid  cancer admitted to the ICU status post total thyroidectomy and central compartment lymph node dissection with anticipated discharge.  Transferred to the ICU with mechanical ventilation.  Vitals, labs, and ECG reviewed.  eICU Interventions  Ordered chest radiograph Monitor a.m. labs Daily spontaneous awakening and breathing trials  DVT prophylaxis with SCDs GI prophylaxis not indicated unless prolonged intubation is anticipated   0547 -CT neck concerning for pneumomediastinum and possible right apical pneumothorax.  Could be consistent with known postop state.  Will follow-up timed chest radiograph at noon to ensure no expanding pneumothorax.  Intervention Category Evaluation Type: New Patient Evaluation  Blanchard Willhite 01/16/2024, 4:35 AM

## 2024-01-16 NOTE — Plan of Care (Signed)
" °  Problem: Clinical Measurements: Goal: Ability to maintain clinical measurements within normal limits will improve Outcome: Progressing   Problem: Clinical Measurements: Goal: Will remain free from infection Outcome: Progressing   Problem: Clinical Measurements: Goal: Diagnostic test results will improve Outcome: Progressing   Problem: Clinical Measurements: Goal: Respiratory complications will improve Outcome: Progressing   Problem: Clinical Measurements: Goal: Cardiovascular complication will be avoided Outcome: Progressing   Problem: Safety: Goal: Ability to remain free from injury will improve Outcome: Progressing   Problem: Skin Integrity: Goal: Risk for impaired skin integrity will decrease Outcome: Progressing    Plan of care, monitoring, assessment, treatment, and intervention (s) ongoing, see MAR see flowsheet "

## 2024-01-16 NOTE — Consult Note (Signed)
 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  Upon d/c from [facility] she went home and she [place] in [place].  That night of d/c she noticed worsening swelling in her neck and face and went to a local hospital.  She was transferred intubated to pre-empt any airways issues en route to [facility].  She apparently did not have any stridor. At [facility] regional she was given Dexamethasone  and Benadryl.  PCCM asked to help with ICU management.  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  Significant Hospital Events: Including procedures, antibiotic start and stop dates in addition to other pertinent events   Transferred to [facility] from [place]   Interim History / Subjective:  N/A  Objective    Blood pressure (!) 143/90, pulse 70, temperature 98.7 F (37.1 C), temperature source Axillary, resp. rate (!) 25, height 5' 5.98 (1.676 m), weight 110.8 kg, SpO2 97%.    Vent Mode: PRVC FiO2 (%):  [50 %] 50 % Set Rate:  [16 bmp] 16 bmp Vt Set:  [470 mL] 470 mL PEEP:  [5 cmH20] 5 cmH20 Plateau Pressure:  [15 cmH20] 15 cmH20  No intake or output data in the 24 hours ending 01/16/24 0508 Filed Weights   01/16/24 0345  Weight: 110.8 kg    Examination: General: sedated and intubated, NAD HENT: Pupils small and reactive, 1-2 mm ETT in place,, swelling over upper neck  and left side of face Lungs: CTA no wheezes no rales Cardiovascular: Reg s1s2 no murmurs Abdomen: soft nt nd bs pos Extremities: no edema, cyanosis, clubbing Neuro: sedated and intubated, follows simple commands GU:  N/A  Resolved problem list   Assessment and Plan  Papillary Thyroid  Cancer s/p total thyroidectomy Awaiting CT neck and face Post op neck and facial swelling F/u CT scan results and Surgery to follow Area not warm or red to suggest cellulitis Holding off antibiotics for now Steroids and benadryl to be re-evaluated post CT Acute hypoxic respiratory failure Vent management SAT/SBT when medically appropriate Propofol  for sedation Gi Prophylaxis, holding off DVT prophylaxis until after CT scan   Labs   CBC: Recent Labs  Lab 01/10/24 1349  WBC 5.8  HGB 13.3  HCT 41.7  MCV 86.5  PLT 194    Basic Metabolic Panel: Recent Labs  Lab 01/10/24 1349 01/15/24 0517  NA 141  --   K 3.8  --   CL 106  --   CO2 26  --   GLUCOSE 168*  --   BUN 15  --   CREATININE 0.79  --   CALCIUM  10.1 9.2   GFR: Estimated Creatinine Clearance: 83.7 mL/min (by C-G formula based on SCr of 0.79 mg/dL). Recent Labs  Lab 01/10/24 1349  WBC 5.8    Liver Function Tests: Recent Labs  Lab 01/10/24 1349  AST 59*  ALT 65*  ALKPHOS  136*  BILITOT 0.4  PROT 6.9  ALBUMIN 4.1   No results for input(s): LIPASE, AMYLASE in the last 168 hours. No results for input(s): AMMONIA in the last 168 hours.  ABG No results found for: PHART, PCO2ART, PO2ART, HCO3, TCO2, ACIDBASEDEF, O2SAT   Coagulation Profile: No results for input(s): INR, PROTIME in the last 168 hours.  Cardiac Enzymes: No results for input(s): CKTOTAL, CKMB, CKMBINDEX, TROPONINI in the last 168 hours.  HbA1C: Hgb A1c MFr Bld  Date/Time Value Ref Range Status  12/29/2019 08:18 AM 5.7 (H) <5.7 % of total Hgb Final    Comment:    For someone without known diabetes, a hemoglobin   A1c value between 5.7% and 6.4% is consistent with prediabetes and should be confirmed with a  follow-up test. . For someone with known diabetes, a value <7% indicates that their diabetes is well controlled. A1c targets should be individualized based on duration of diabetes, age, comorbid conditions, and other considerations. . This assay result is consistent with an increased risk of diabetes. . Currently, no consensus exists regarding use of hemoglobin A1c for diagnosis of diabetes for children. SABRA   06/29/2019 09:50 AM 5.6 4.8 - 5.6 % Final    Comment:             Prediabetes: 5.7 - 6.4          Diabetes: >6.4          Glycemic control for adults with diabetes: <7.0     CBG: Recent Labs  Lab 01/16/24 0352  GLUCAP 183*    Review of Systems:   Intubated and sedated  Past Medical History:  She,  has a past medical history of Abdominal pain (11/27/2010), Accessory skin tags (01/25/2013), Allergy, Anemia, Anisocoria, Breast cancer (HCC) (2023), Cancer (HCC), Colon polyps (12/22/2010), Complication of anesthesia, DDD (degenerative disc disease), cervical, Elevated LFTs (11/27/2010), Family history of breast cancer, Family history of colon cancer, Family history of colon cancer, Fatty infiltration of liver, Gastritis, GERD (gastroesophageal reflux disease), Hiatal hernia, HTN (hypertension), Hypothyroidism, IBS (irritable bowel syndrome), Muscle spasm of back (06/04/2015), Osteoarthritis, Pancreatitis, Personal history of radiation therapy, and Sinusitis.   Surgical History:   Past Surgical History:  Procedure Laterality Date   ANAL FISTULECTOMY     BREAST LUMPECTOMY     BREAST LUMPECTOMY WITH RADIOACTIVE SEED AND SENTINEL LYMPH NODE BIOPSY Left 07/03/2021   Procedure: LEFT BREAST LUMPECTOMY WITH RADIOACTIVE SEED AND SENTINEL LYMPH NODE BIOPSY;  Surgeon: Vernetta Berg, MD;  Location: [facility];  Service: General;  Laterality: Left;  LMA   CHOLECYSTECTOMY      COLONOSCOPY     COLONOSCOPY W/ BIOPSIES     ESOPHAGOGASTRODUODENOSCOPY     EYE SURGERY     cataracts   PATELLA RECONSTRUCTION     pins   POLYPECTOMY     REPLACEMENT TOTAL KNEE Right 12/2017   THYROIDECTOMY, PARTIAL N/A 01/14/2024   Procedure: THYROIDECTOMY, SUBTOTAL;  Surgeon: Eletha Boas, MD;  Location: [facility];  Service: General;  Laterality: N/A;  TOTAL THYROIDECTOMY WITH LIMITED LYMPH NODE DISSECTION   TUBAL LIGATION     UPPER GASTROINTESTINAL ENDOSCOPY       Social History:   reports that she quit smoking about 25 years ago. Her smoking use included cigarettes. She has been exposed to tobacco smoke. She has never used smokeless tobacco. She reports that she does not drink alcohol and does not use drugs.   Family History:  Her family history includes Alzheimer's  disease in her maternal aunt and paternal grandmother; Breast cancer (age of onset: 48) in her maternal aunt; COPD in her father; Cancer in her brother and father; Cancer (age of onset: 22) in her brother; Cirrhosis in her sister; Colon cancer in her brother and sister; Colon polyps in her brother, sister, and sister; Diabetes in her maternal grandmother, mother, and sister; Heart attack in her paternal grandfather; Heart attack (age of onset: 28) in her son; Hyperlipidemia in her brother and sister; Hypertension in her brother and sister; Irritable bowel syndrome in her mother; Kartagener's syndrome in her sister; Liver cancer in her sister; Liver disease in her sister; Non-Hodgkin's lymphoma in her niece, paternal uncle, and sister; Rectal cancer in her brother; Stroke in her mother. There is no history of Heart disease, Esophageal cancer, or Stomach cancer.   Allergies Allergies[1]   Home Medications  Prior to Admission medications  Medication Sig Start Date End Date Taking? Authorizing Provider  anastrozole  (ARIMIDEX ) 1 MG tablet TAKE 1 TABLET BY MOUTH DAILY 07/05/23   Gudena, Vinay, MD  Ascorbic Acid (VITAMIN C) 100 MG  tablet Take 100 mg by mouth daily.    [provider]  aspirin 81 MG tablet Take 81 mg by mouth daily.    [provider]  calcium  carbonate (TUMS) 500 MG chewable tablet Chew 2 tablets (400 mg of elemental calcium  total) by mouth 3 (three) times daily. 01/14/24   Eletha Boas, MD  Calcium  Carbonate-Vitamin D (CALCIUM -D) 600-400 MG-UNIT TABS Take 1 tablet by mouth daily.    [provider]  fish oil-omega-3 fatty acids 1000 MG capsule Take 1 g by mouth every other day.    [provider]  fluticasone  (FLONASE ) 50 MCG/ACT nasal spray Place 2 sprays into both nostrils daily. 12/30/16   Juliane Che, PA  Garlic 400 MG TABS Take by mouth. 1000 mg daily    [provider]  levothyroxine  (SYNTHROID ) 100 MCG tablet Take 1 tablet (100 mcg total) by mouth daily before breakfast. 01/14/24 01/13/25  Eletha Boas, MD  loratadine (CLARITIN REDITABS) 10 MG dissolvable tablet Take 10 mg by mouth daily.    [provider]  magnesium oxide (MAG-OX) 400 (240 Mg) MG tablet Take 240 mg by mouth daily.    [provider]  meloxicam  (MOBIC ) 15 MG tablet Take 1 tablet (15 mg total) by mouth daily as needed for pain. Take 1 tablet by mouth  daily 08/01/19   Melonie Colonel, Mikel HERO, MD  Multiple Vitamin (MULTIVITAMIN PO) Take 0.5 tablets by mouth every other day.    [provider]  nystatin  (MYCOSTATIN /NYSTOP ) powder Apply 1 Application topically 2 (two) times daily. 03/09/22   Signa Delon LABOR, NP  pantoprazole  (PROTONIX ) 40 MG tablet Take 1 tablet by mouth two  times daily Patient taking differently: Take 40 mg by mouth daily. Take 1 tablet by mouth two  times daily 08/01/19   Melonie Colonel, Mikel HERO, MD  potassium chloride  SA (KLOR-CON ) 20 MEQ tablet Take 1 tablet (20 mEq total) by mouth daily. Patient not taking: Reported on 01/10/2024 08/04/19   Melonie Colonel, Mikel HERO, MD  propranolol  ER (INDERAL  LA) 80 MG 24 hr capsule TAKE 1 CAPSULE BY MOUTH EVERY  DAY Patient taking differently: at bedtime. TAKE 1 CAPSULE BY MOUTH EVERY DAY 02/06/20   Gosrani, Nimish C, MD  pyridOXINE (VITAMIN B-6) 100 MG tablet Take 100 mg by mouth daily.    [provider]  traMADol  (ULTRAM ) 50 MG tablet Take 1-2  tablets (50-100 mg total) by mouth every 6 (six) hours as needed for moderate pain (pain score 4-6). 01/14/24   Eletha Boas, MD  valsartan -hydrochlorothiazide  (DIOVAN -HCT) 80-12.5 MG tablet Take 1 tablet by mouth daily. 03/05/20   Gosrani, Nimish C, MD  vitamin B-12 (CYANOCOBALAMIN ) 1000 MCG tablet Take 1,000 mcg by mouth daily.    [provider]  Zinc 100 MG TABS Take 1 tablet by mouth every other day.    [provider]  propranolol  (INNOPRAN  XL) 80 MG 24 hr capsule Take 1 capsule (80 mg total) by mouth at bedtime. 04/21/12 12/29/12  Juliane Che, PA     Critical care time: 91   The patient is critically ill with multiple organ system failure and requires high complexity decision making for assessment and support, frequent evaluation and titration of therapies, advanced monitoring, review of radiographic studies and interpretation of complex data.   Critical Care Time devoted to patient care services, exclusive of separately billable procedures, described in this note is 33 minutes.   Orlin Fairly, MD [facility] See Amion for pager  If no response to pager , please call [phone] until 7pm After 7:00 pm call Elink  [phone] 01/16/2024, 5:08 AM            [1]  Allergies Allergen Reactions   Amoxicillin      rash   Codeine     REACTION: headache   Famotidine Nausea Only    pepcid   "

## 2024-01-16 NOTE — Progress Notes (Signed)
 Pt transported from 68M to CT and back to 68M without complications

## 2024-01-17 ENCOUNTER — Inpatient Hospital Stay (HOSPITAL_COMMUNITY)

## 2024-01-17 DIAGNOSIS — E039 Hypothyroidism, unspecified: Secondary | ICD-10-CM

## 2024-01-17 DIAGNOSIS — I10 Essential (primary) hypertension: Secondary | ICD-10-CM

## 2024-01-17 DIAGNOSIS — R918 Other nonspecific abnormal finding of lung field: Secondary | ICD-10-CM

## 2024-01-17 DIAGNOSIS — K219 Gastro-esophageal reflux disease without esophagitis: Secondary | ICD-10-CM

## 2024-01-17 LAB — CBC
HCT: 34.6 % — ABNORMAL LOW (ref 36.0–46.0)
Hemoglobin: 11.3 g/dL — ABNORMAL LOW (ref 12.0–15.0)
MCH: 28.1 pg (ref 26.0–34.0)
MCHC: 32.7 g/dL (ref 30.0–36.0)
MCV: 86.1 fL (ref 80.0–100.0)
Platelets: 174 K/uL (ref 150–400)
RBC: 4.02 MIL/uL (ref 3.87–5.11)
RDW: 13 % (ref 11.5–15.5)
WBC: 12.5 K/uL — ABNORMAL HIGH (ref 4.0–10.5)
nRBC: 0 % (ref 0.0–0.2)

## 2024-01-17 LAB — BASIC METABOLIC PANEL WITH GFR
Anion gap: 12 (ref 5–15)
BUN: 22 mg/dL (ref 8–23)
CO2: 24 mmol/L (ref 22–32)
Calcium: 7 mg/dL — ABNORMAL LOW (ref 8.9–10.3)
Chloride: 103 mmol/L (ref 98–111)
Creatinine, Ser: 0.76 mg/dL (ref 0.44–1.00)
GFR, Estimated: >60 mL/min
Glucose, Bld: 119 mg/dL — ABNORMAL HIGH (ref 70–99)
Potassium: 3.6 mmol/L (ref 3.5–5.1)
Sodium: 138 mmol/L (ref 135–145)

## 2024-01-17 LAB — GLUCOSE, CAPILLARY
Glucose-Capillary: 120 mg/dL — ABNORMAL HIGH (ref 70–99)
Glucose-Capillary: 110 mg/dL — ABNORMAL HIGH (ref 70–99)
Glucose-Capillary: 89 mg/dL (ref 70–99)
Glucose-Capillary: 83 mg/dL (ref 70–99)
Glucose-Capillary: 88 mg/dL (ref 70–99)

## 2024-01-17 LAB — CALCIUM: Calcium: 7 mg/dL — ABNORMAL LOW (ref 8.9–10.3)

## 2024-01-17 LAB — TRIGLYCERIDES: Triglycerides: 93 mg/dL

## 2024-01-17 MED ORDER — SODIUM CHLORIDE 0.9 % IV SOLN
2.0000 g | INTRAVENOUS | Status: DC
  Administered 2024-01-17: 2 g via INTRAVENOUS
  Filled 2024-01-17: qty 20

## 2024-01-17 MED ORDER — ANASTROZOLE 1 MG PO TABS
1.0000 mg | ORAL_TABLET | Freq: Every day | ORAL | Status: DC
  Filled 2024-01-17: qty 1

## 2024-01-17 MED ORDER — CHLORHEXIDINE GLUCONATE CLOTH 2 % EX PADS: 6.0000 | MEDICATED_PAD | Freq: Every day | CUTANEOUS | Status: AC

## 2024-01-17 MED ORDER — POLYETHYLENE GLYCOL 3350 17 G PO PACK: 17.0000 g | PACK | Freq: Every day | ORAL | 0 refills | Status: AC

## 2024-01-17 MED ORDER — ENOXAPARIN SODIUM 60 MG/0.6ML IJ SOSY
50.0000 mg | PREFILLED_SYRINGE | Freq: Every day | INTRAMUSCULAR | Status: DC
  Administered 2024-01-17: 50 mg via SUBCUTANEOUS
  Filled 2024-01-17: qty 0.6

## 2024-01-17 MED ORDER — ORAL CARE MOUTH RINSE: 15.0000 mL | OROMUCOSAL | Status: AC

## 2024-01-17 MED ORDER — INSULIN ASPART 100 UNIT/ML IJ SOLN: 0.0000 [IU] | INTRAMUSCULAR | 11 refills | Status: AC

## 2024-01-17 MED ORDER — HYDRALAZINE HCL 20 MG/ML IJ SOLN: 10.0000 mg | INTRAMUSCULAR | Status: AC | PRN

## 2024-01-17 MED ORDER — CALCIUM GLUCONATE-NACL 2-0.675 GM/100ML-% IV SOLN
2.0000 g | INTRAVENOUS | Status: AC
  Administered 2024-01-17: 2000 mg via INTRAVENOUS
  Filled 2024-01-17: qty 100

## 2024-01-17 MED ORDER — PANTOPRAZOLE SODIUM 40 MG IV SOLR: 40.0000 mg | Freq: Every day | INTRAVENOUS | Status: AC

## 2024-01-17 MED ORDER — SODIUM CHLORIDE 0.9 % IV SOLN: 2.0000 g | INTRAVENOUS | Status: AC

## 2024-01-17 MED ORDER — METRONIDAZOLE 500 MG/100ML IV SOLN: 500.0000 mg | Freq: Two times a day (BID) | INTRAVENOUS | Status: AC

## 2024-01-17 MED ORDER — LEVOTHYROXINE SODIUM 100 MCG PO TABS: 100.0000 ug | ORAL_TABLET | Freq: Every day | ORAL | Status: AC

## 2024-01-17 MED ORDER — SENNOSIDES-DOCUSATE SODIUM 8.6-50 MG PO TABS: 1.0000 | ORAL_TABLET | Freq: Two times a day (BID) | ORAL | Status: DC

## 2024-01-17 MED ORDER — ANASTROZOLE 1 MG PO TABS: 1.0000 mg | ORAL_TABLET | Freq: Every day | ORAL | Status: AC

## 2024-01-17 MED ORDER — INSULIN ASPART 100 UNIT/ML IJ SOLN: 0.0000 [IU] | INTRAMUSCULAR | Status: DC

## 2024-01-17 MED ORDER — SENNOSIDES-DOCUSATE SODIUM 8.6-50 MG PO TABS: 1.0000 | ORAL_TABLET | Freq: Two times a day (BID) | ORAL | Status: AC

## 2024-01-17 MED ORDER — PROPOFOL 1000 MG/100ML IV EMUL: 0.0000 ug/kg/min | INTRAVENOUS | Status: AC

## 2024-01-17 MED ORDER — FENTANYL 2500MCG IN NS 250ML (10MCG/ML) PREMIX INFUSION: 0.0000 ug/h | INTRAVENOUS | Status: AC

## 2024-01-17 MED ORDER — FENTANYL BOLUS VIA INFUSION: 25.0000 ug | INTRAVENOUS | Status: AC | PRN

## 2024-01-17 MED ORDER — ENOXAPARIN SODIUM 60 MG/0.6ML IJ SOSY: 50.0000 mg | PREFILLED_SYRINGE | Freq: Every day | INTRAMUSCULAR | Status: AC

## 2024-01-17 MED ORDER — METRONIDAZOLE 500 MG/100ML IV SOLN
500.0000 mg | Freq: Two times a day (BID) | INTRAVENOUS | Status: DC
  Administered 2024-01-17: 500 mg via INTRAVENOUS
  Filled 2024-01-17: qty 100

## 2024-01-17 MED ORDER — ORAL CARE MOUTH RINSE: 15.0000 mL | OROMUCOSAL | Status: AC | PRN

## 2024-01-17 MED ORDER — LEVOTHYROXINE SODIUM 100 MCG PO TABS
100.0000 ug | ORAL_TABLET | Freq: Every day | ORAL | Status: DC
  Filled 2024-01-17: qty 1

## 2024-01-17 MED ORDER — POLYETHYLENE GLYCOL 3350 17 G PO PACK: 17.0000 g | PACK | Freq: Every day | ORAL | Status: DC

## 2024-01-17 NOTE — Progress Notes (Signed)
 Initial Nutrition Assessment  DOCUMENTATION CODES:  Obesity unspecified  INTERVENTION:  If pt to remains intubated, recommend the following via OGT:  Osmolite 1.5 at 50 ml/h (1200 ml per day) Start at 20 and advance by 10mL every 6 hours to reach goal Prosource TF20 60 ml 1x/d Provides 1880 kcal, 95 gm protein, 914 ml free water daily  NUTRITION DIAGNOSIS:  Inadequate oral intake related to inability to eat as evidenced by NPO status.  GOAL:  Patient will meet greater than or equal to 90% of their needs  MONITOR:  I & O's, Vent status, Labs  REASON FOR ASSESSMENT:  Ventilator    ASSESSMENT:  Pt with hx of GERD, hiatal hernia, HTN, hypothyroidism, IBS, hx of breast cancer and current papillary thyroid  cancer presented to ED after she began to have worsening swelling in her neck and face after surgery 12/19.  12/19 - underwent total thyroidectomy with central compartment lymph node dissection for papillary carcinoma at [facility] 12/20 - discharged home 12/21 - presented to outside hospital ED for neck and facial swelling, intubated, transferred to [facility]   Patient is currently intubated on ventilator support. Awake and alert and able to nod or shake head to answer questions. Son also at bedside assists with hx. Reports that until the day of admission, appetite was normal and weight has been stable. No muscle or fat deficits noted on exam and do not feel pt will be at risk for refeeding if enteral nutrition is started.   Discussed in rounds. Currently, plan is for ENT to evaluate to determine if further interventions are needed. Extubation pending ENT plan. If not going to surgery but staying on vent, will begin enteral feeds.    MV: 9.4 L/min Temp (24hrs), Avg:99 F (37.2 C), Min:98.3 F (36.8 C), Max:99.9 F (37.7 C) MAP (cuff): 75 mmHg  Propofol : 12.96 ml/hr  Admit / Current weight: 110.8 kg    Intake/Output Summary (Last 24 hours) at 01/17/2024 1238 Last data  filed at 01/17/2024 1000 Gross per 24 hour  Intake 1669.08 ml  Output 520 ml  Net 1149.08 ml  Net IO Since Admission: 991.37 mL [01/17/24 1238]  Drains/Lines: OGT 16 Fr. UOP x 24 hours  Nutritionally Relevant Medications: Scheduled Meds:  insulin  aspart  0-15 Units Subcutaneous Q4H   pantoprazole    40 mg Intravenous QHS   Continuous Infusions:  fentaNYL  infusion INTRAVENOUS 50 mcg/hr (01/17/24 0800)   propofol  (DIPRIVAN ) infusion 20 mcg/kg/min (01/17/24 0800)   Labs Reviewed: CBG ranges from 110-183 mg/dL over the last 24 hours HgbA1c 6.0%  NUTRITION - FOCUSED PHYSICAL EXAM: Flowsheet Row Most Recent Value  Orbital Region No depletion  Upper Arm Region No depletion  Thoracic and Lumbar Region No depletion  Buccal Region No depletion  Temple Region No depletion  Clavicle Bone Region No depletion  Clavicle and Acromion Bone Region No depletion  Scapular Bone Region No depletion  Dorsal Hand No depletion  Patellar Region No depletion  Anterior Thigh Region No depletion  Posterior Calf Region No depletion  Edema (RD Assessment) Mild  [BLE]  Hair Reviewed  Eyes Reviewed  Mouth Reviewed  Skin Reviewed  Nails Reviewed    Diet Order:   Diet Order             Diet NPO time specified  Diet effective now                   EDUCATION NEEDS:  Education needs have been addressed  Skin:  Skin Assessment: Reviewed RN Assessment  Last BM:  unsure  Height:  Ht Readings from Last 1 Encounters:  01/16/24 5' 5.98 (1.676 m)    Weight:  Wt Readings from Last 1 Encounters:  01/16/24 110.8 kg    Ideal Body Weight:  59.1 kg  BMI:  Body mass index is 39.45 kg/m.  Estimated Nutritional Needs:  Kcal:  1800-2000 kcal/d Protein:  90-105 g/d Fluid:  2L/d    Vernell Lukes, RD, LDN, CNSC Registered Dietitian II Please reach out via secure chat

## 2024-01-17 NOTE — Progress Notes (Signed)
 "   Assessment & Plan: POD#3 - status post total thyroidectomy with limited lymph node dissection (Zone VI) - events of past 72 hours reviewed - patient now in ICU at [facility], intubated - appreciate CCM consultation - discussed with Dr. 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  I contacted both of our local ENT groups, neither of whom manage tracheal problems, and they suggested referral to a tertiary care center.  Patient seen and evaluated in ICU.  Nurse and patient's son, Catherine Navarro, at bedside.  Patient awake and responsive.  Again discussed operative findings and suspected problem with tracheal perforation at site of tumor.  Contacted patient's husband, Catherine Navarro.  Patient had been discharged home without obvious complications.  He states she did have to exert herself on arrival at home in order to transfer into an ATV for the ride to their home.  Apparently this did require some significant exertion on the part of the patient.  Swelling in the neck developed sometime after arrival at home.  Discussed current status of patient and options for management.  Husband would prefer [facility] since it is closer to their home, if the expertise for management is available there.  I also contacted Dr. Kassie from CCM to discuss the case and appreciate her support.  Currently have call in to the Physician Access Line at [facility] for ENT.  Awaiting  response to discuss case.        Catherine Spinner, MD [facility] practice Office: [phone]        Chief Complaint: Status post thyroidectomy, tracheal perforation  Subjective: Patient in ICU, nurse and patient's son at bedside.  Awake, responsive.  Objective: Vital signs in last 24 hours: Temp:  [98.3 F (36.8 C)-99.9 F (37.7 C)] 99.9 F (37.7 C) (12/22 1119) Pulse Rate:  [55-64] 64 (12/22 1440) Resp:  [10-21] 16 (12/22 1440) BP: (98-134)/(52-74) 121/61 (12/22 1430) SpO2:  [92 %-99 %] 99 % (12/22 1440) FiO2 (%):  [40 %] 40 % (12/22 1200) Last BM Date :  (PTA)  Intake/Output from previous day: 12/21 0701 - 12/22 0700 In: 2042.2 [I.V.:2042.2] Out: 1220 [Urine:1220] Intake/Output this shift: Total I/O In: 225.4 [I.V.:125.4; IV Piggyback:100] Out: -   Physical Exam: HEENT - sclerae clear, mucous membranes moist Neck - moderate soft tissue swelling; wound dry and intact with Dermabond; crepitance not noted  Lab Results:  Recent Labs    01/17/24 0102  WBC 12.5*  HGB 11.3*  HCT 34.6*  PLT 174   BMET Recent Labs    01/15/24 0517 01/16/24 0850 01/17/24 0102  NA  --   --  138  K  --   --  3.6  CL  --   --  103  CO2  --   --  24  GLUCOSE  --   --  119*  BUN  --   --  22  CREATININE  --  0.75 0.76  CALCIUM  9.2  --  7.0*  PT/INR No results for input(s): LABPROT, INR in the last 72 hours. Comprehensive Metabolic Panel:    Component Value Date/Time   NA 138 01/17/2024 0102   NA 141 01/10/2024 1349   NA 143 06/29/2019 0950   NA 143 12/29/2018 0956   K 3.6 01/17/2024 0102   K 3.8 01/10/2024 1349   CL 103 01/17/2024 0102   CL 106 01/10/2024 1349   CO2 24 01/17/2024 0102   CO2 26 01/10/2024 1349   BUN 22 01/17/2024 0102   BUN 15 01/10/2024 1349   BUN 19 06/29/2019 0950   BUN 13 12/29/2018 0956   CREATININE 0.76 01/17/2024 0102   CREATININE 0.75 01/16/2024 0850   CREATININE 0.71 12/29/2019 0818   CREATININE 0.73  08/06/2015 1119   GLUCOSE 119 (H) 01/17/2024 0102   GLUCOSE 168 (H) 01/10/2024 1349   CALCIUM  7.0 (L) 01/17/2024 0102   CALCIUM  9.2 01/15/2024 0517   AST 59 (H) 01/10/2024 1349   AST 35 12/29/2019 0818   ALT 65 (H) 01/10/2024 1349   ALT 40 (H) 12/29/2019 0818   ALKPHOS 136 (H) 01/10/2024 1349   ALKPHOS 103 06/29/2019 0950   BILITOT 0.4 01/10/2024 1349   BILITOT 0.5 12/29/2019 0818   BILITOT 0.5 06/29/2019 0950   BILITOT 0.4 06/30/2018 1003   PROT 6.9 01/10/2024 1349   PROT 6.5 12/29/2019 0818   PROT 6.9 06/29/2019 0950   PROT 7.0 06/30/2018 1003   ALBUMIN 4.1 01/10/2024 1349   ALBUMIN 4.5 06/29/2019 0950   ALBUMIN 4.3 06/30/2018 1003    Studies/Results: DG Chest Port 1 View Result Date: 01/17/2024 EXAM: 1 VIEW(S) XRAY OF THE CHEST 01/17/2024 01:59:00 AM COMPARISON: 01/16/2024 CLINICAL HISTORY: Endotracheal tube present FINDINGS: LINES, TUBES AND DEVICES: Endotracheal tube in place with tip 4.3 cm above the carina. LUNGS AND PLEURA: Low lung volumes. Stable subsegmental atelectasis at left lung base and right mid lung . Small bilateral pleural effusions. No pneumothorax. HEART AND MEDIASTINUM: Persistent pneumomediastinum. Aortic atherosclerosis. No acute abnormality of the cardiac silhouette. BONES AND SOFT TISSUES: Persistent subcutaneous emphysema in lower neck. No acute osseous abnormality. IMPRESSION: 1. Endotracheal tube tip 4.3 cm above the carina. 2. Persistent pneumomediastinum and subcutaneous emphysema in the lower neck. Electronically signed by: Catherine Gatlin MD 01/17/2024 02:11 AM EST RP Workstation: [id number]   CT ANGIO HEAD NECK W WO CM Result Date: 01/16/2024 EXAM: CTA Head and Neck with Intravenous Contrast. CT Head without Contrast. CLINICAL HISTORY: Neck and facial swelling after thyroidectomy 2 days ago. Diffuse neck and face soft tissue swelling. TECHNIQUE: Axial CTA images of the head and neck performed with intravenous contrast. MIP reconstructed images were  created and reviewed. Axial computed tomography images of the head/brain performed without intravenous contrast. Note: Per PQRS, the description of internal carotid artery percent stenosis, including 0 percent or normal exam, is based on [employer] American Symptomatic Carotid Endarterectomy Trial (NASCET) criteria. Dose reduction technique was used including one or more of the following: automated exposure control, adjustment of mA and kV according to patient size, and/or iterative reconstruction. CONTRAST: Without and with IV contrast. 75 mL iohexol  (OMNIPAQUE ) 350 MG/ML injection. COMPARISON: None provided. FINDINGS: CT HEAD: BRAIN: Generalized atrophy and white matter hypoattenuation is present. No acute intraparenchymal hemorrhage. No mass lesion. No CT evidence for acute territorial infarct. No midline shift or extra-axial collection. VENTRICLES: No hydrocephalus. ORBITS: Gas extends into the left inferior eyelid. SINUSES AND MASTOIDS: The paranasal sinuses and mastoid air cells are clear. CTA NECK: COMMON CAROTID ARTERIES: Atherosclerotic calcifications  are present at the aortic arch and great vessel origins. No significant stenosis. No dissection or occlusion. INTERNAL CAROTID ARTERIES: Atherosclerotic changes are present at the right carotid bifurcation without focal stenosis. Tortuosity is present in the cervical left ICA without focal stenosis. Atherosclerotic calcifications are present within the cavernous internal carotid arteries bilaterally. Stenosis are present in the cavernous segments. No dissection or occlusion. VERTEBRAL ARTERIES: No significant stenosis. No dissection or occlusion. CTA HEAD: ANTERIOR CEREBRAL ARTERIES: No significant stenosis. No occlusion. No aneurysm. MIDDLE CEREBRAL ARTERIES: No significant stenosis. No occlusion. No aneurysm. POSTERIOR CEREBRAL ARTERIES: Fetal type posterior cerebral arteries are present bilaterally. No significant stenosis. No occlusion. No aneurysm. BASILAR  ARTERY: No significant stenosis. No occlusion. No aneurysm. OTHER: SOFT TISSUES: Endotracheal tube is in place. The tip extends just into the right mainstem bronchus and could be pulled back 2 to 3 cm for more optimal positioning. Pneumomediastinum is present. Extensive subcutaneous gas is present throughout the neck extending into the face, slightly more prominent on the right. Gas extends into the left inferior eyelid. No masses or lymphadenopathy. BONES: Multilevel degenerative changes are present within the cervical spine. IMPRESSION: 1. Extensive subcutaneous gas throughout the neck and face, slightly more prominent on the right, extending intracheal injury, potentially related to intubation. No discrete injury is evident. to the left inferior eyelid. 2. Pneumomediastinum. Findings suggest 3. Endotracheal tube tip extends just into the right mainstem bronchus and could be pulled back 2 to 3 cm for more optimal positioning. 4. Atherosclerotic changes as described. No focal stenosis. 5. No acute or focal vascular injury. 6. Findings were called to Dr. Stevie at 12:30 pm. Electronically signed by: Lonni Necessary MD 01/16/2024 12:32 PM EST RP Workstation: [id number]   DG CHEST PORT 1 VIEW Result Date: 01/16/2024 EXAM: 1 VIEW(S) XRAY OF THE CHEST 01/16/2024 12:18:58 PM COMPARISON: 01/16/2024 CLINICAL HISTORY: Pneumothorax FINDINGS: LINES, TUBES AND DEVICES: Endotracheal tube in place, tip terminates at the carina approaching the right mainstem bronchus. LUNGS AND PLEURA: Low lung volumes with atelectasis in both lung bases. Crowding of perihilar and bibasilar bronchovascular structures. No pneumothorax identified. Blunting of left lateral costophrenic angle. HEART AND MEDIASTINUM: Normal heart size. Aortic atherosclerosis (ICD10-170.0). BONES AND SOFT TISSUES: Bilateral supraclavicular subcutaneous emphysema. No acute osseous abnormality. IMPRESSION: 1. No pneumothorax identified. 2. Bilateral  supraclavicular subcutaneous emphysema and pneumomediastinum. Unchanged from prior exam. . 3. Low lung volumes with subsegmental atelectasis in lung bases and crowding of perihilar and bibasilar bronchovascular structures. Electronically signed by: Waddell Calk MD 01/16/2024 12:26 PM EST RP Workstation: [id number]   CT CHEST WO CONTRAST Result Date: 01/16/2024 EXAM: CT CHEST WITHOUT CONTRAST 01/16/2024 11:50:38 AM TECHNIQUE: CT of the chest was performed without the administration of intravenous contrast. Multiplanar reformatted images are provided for review. Automated exposure control, iterative reconstruction, and/or weight based adjustment of the mA/kV was utilized to reduce the radiation dose to as low as reasonably achievable. COMPARISON: None available. CLINICAL HISTORY: Pneumothorax. FINDINGS: MEDIASTINUM: The endotracheal tube tip terminates at the level of the carina, approaching the right mainstem bronchus. Consider retraction of the endotracheal tube by 2 cm. Heart and pericardium are unremarkable. Aortic atherosclerotic calcification and multivessel coronary artery calcification. No mediastinal or pericardial fluid collection identified. Pneumomediastinum is present. The central airways are clear. LYMPH NODES: No mediastinal, hilar or axillary lymphadenopathy. LUNGS AND PLEURA: Bilateral posterior and medial lower lobe consolidation and atelectasis identified. Several lung nodules are identified, including considerations for a metastatic colon origin. An anterior left upper lobe nodule measures  5 mm (image 58/4). An anterior right lower lobe lung nodule measures 3 mm (image 61/4). A lateral left upper lobe nodule measures 3 mm (image 63/4). An inferior right middle lobe lung nodule measures 5 mm (image 88/4). No pleural effusion. No pneumothorax identified. SOFT TISSUES/BONES: Thoracic degenerative disc disease. No acute or suspicious osseous findings. There is fluid density within the thyroid  bed  with surrounding surgical clips compatible with recent thyroidectomy. Extensive soft tissue gas is identified throughout portions of the neck, bilateral supraclavicular regions, and ventral chest wall. Soft tissue gas also tracks into the left axilla. UPPER ABDOMEN: Limited images of the upper abdomen demonstrate calcification within the upper pole of the right kidney measuring 5 mm. No other acute abnormality. IMPRESSION: 1. No pneumothorax identified. 2. Endotracheal tube tip terminates at the level of the carina and is approaching the right mainstem bronchus; consider retraction by 2 cm. 3. Pneumomediastinum and extensive soft tissue gas throughout the neck, bilateral supraclavicular regions, ventral chest wall, and left axilla. 4. Bilateral posterior and medial lower lobe consolidation and atelectasis, with no pleural effusion. 5. Scattered 5 mm or smaller lung nodules identified. Nonspecific. No consensus criteria for follow-up available for patients with a known malignancy. Consider repeat imaging in 3 to 6 months to ensure the stability of these lung nodules in this patient who has a history of known thyroid  cancer. 6. The urgent finding will be called to the ordering provider by the [facility] [employer]ist[employer] (PRAs) and documented in the [facility] dashboard. Electronically signed by: Waddell Calk MD 01/16/2024 12:15 PM EST RP Workstation: [id number]   DG CHEST PORT 1 VIEW Result Date: 01/16/2024 EXAM: 1 VIEW(S) XRAY OF THE CHEST 01/16/2024 05:24:00 AM COMPARISON: None available. CLINICAL HISTORY: Intubation of airway performed without difficulty. FINDINGS: LINES, TUBES AND DEVICES: Endotracheal tube in place with tip 4 cm above the carina. LUNGS AND PLEURA: Linear atelectasis at right lung base. Trace left apical pneumothorax. No pleural effusion. No focal pulmonary opacity. HEART AND MEDIASTINUM: Atherosclerotic plaque noted. Small volume pneumomediastinum. BONES AND SOFT TISSUES: Extensive  subcutaneous emphysema along the neck and right upper chest. No acute osseous abnormality. IMPRESSION: 1. Endotracheal tube in place with tip 4 cm above the carina. 2. Small volume pneumomediastinum and trace left apical pneumothorax. 3. Extensive subcutaneous emphysema along the neck and right upper chest. 4. Critical results were called to the ordering provider at the time of interpretation. As personally spoke with doctor paliwal at the time of interpretation on 01/16/2024 at 5:50 am. Electronically signed by: Waddell Calk MD 01/16/2024 05:51 AM EST RP Workstation: [id number]      Catherine Navarro 01/17/2024  Patient ID: Catherine Navarro, female   DOB: [birth year], 68 y.o.   MRN: [id number]  "

## 2024-01-17 NOTE — Progress Notes (Signed)
" ° ° ° °  I was made aware of this patient's admission and clinical course this morning.  Reviewed records.  At the time of surgery, the malignant tumor in the thyroid  gland was adherent to the trachea and was resected.  Suspect tracheal injury/perforation at site of tumor.  I will contact ENT this morning for consultation.  I am in the OR at [facility] and will see the patient and her family this afternoon.  I notified the nurse in the 12[facility] ICU of this earlier this morning.  Krystal Spinner, MD [facility] practice Office: [phone]  "

## 2024-01-17 NOTE — Nursing Note (Signed)
 RT unable to obtain abg sample. Provider aware, vbg sent.
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
## 2024-01-17 NOTE — TOC CM/SW Note (Signed)
 Transition of Care [facility]) - Inpatient Brief Assessment   Patient Details  Name: Kiondra Caicedo MRN: [id number] Date of Birth: Feb 25, 1955  Transition of Care [facility]) CM/SW Contact:    Lauraine FORBES Saa, LCSWA Phone Number: 01/17/2024, 3:01 PM   Clinical Narrative:  3:01 PM Per chart review, patient resides at home with spouse. Patient has a PCP and insurance. Patient does not have SNF/HH/DME history. Patient's preferred pharmacy's are [facility], [facility] [place], and [facility] ([facility] [place]. No TOC needs identified at this time. TOC will continue to follow.  Transition of Care Asessment: Insurance and Status: Insurance coverage has been reviewed Patient has primary care physician: Yes Home environment has been reviewed: Private Residence Prior level of function:: N/A Prior/Current Home Services: No current home services Social Drivers of Health Review: SDOH reviewed no interventions necessary Readmission risk has been reviewed: Yes (Currently Green 14%) Transition of care needs: no transition of care needs at this time

## 2024-01-17 NOTE — Progress Notes (Signed)
 "  Progress Note     Subjective: Son at bedside. Patient alert and intubated. Responds to commands.  Chest xray 12/22 with persistent pneumomediastinum and subcutaneous emphysema in lower neck. Abdominal xray pending.   ROS  All negative with the exception of above.  Objective: Vital signs in last 24 hours: Temp:  [98.3 F (36.8 C)-99.7 F (37.6 C)] 99 F (37.2 C) (12/22 0715) Pulse Rate:  [52-64] 58 (12/22 0830) Resp:  [10-21] 15 (12/22 0830) BP: (98-140)/(50-89) 116/59 (12/22 0830) SpO2:  [91 %-100 %] 98 % (12/22 0830) FiO2 (%):  [40 %] 40 % (12/22 0800) Last BM Date :  (PTA)  Intake/Output from previous day: 12/21 0701 - 12/22 0700 In: 2042.2 [I.V.:2042.2] Out: 1220 [Urine:1220] Intake/Output this shift: Total I/O In: 17.9 [I.V.:17.9] Out: -   PE: General: Alert female who is laying in bed in NAD. HEENT: On vent. Incision of neck with dermabond. C/D/I.  Heart: Normal heart rate during encounter. Palpable radial bilaterally. Lungs: Intubated on ventilator. Abd: Soft, ND, NT. No rebound tenderness or guarding.  MS: all 4 extremities are symmetrical with no cyanosis, clubbing, or edema.   Lab Results:  Recent Labs    01/17/24 0102  WBC 12.5*  HGB 11.3*  HCT 34.6*  PLT 174   BMET Recent Labs    01/15/24 0517 01/16/24 0850 01/17/24 0102  NA  --   --  138  K  --   --  3.6  CL  --   --  103  CO2  --   --  24  GLUCOSE  --   --  119*  BUN  --   --  22  CREATININE  --  0.75 0.76  CALCIUM  9.2  --  7.0*   PT/INR No results for input(s): LABPROT, INR in the last 72 hours. CMP     Component Value Date/Time   NA 138 01/17/2024 0102   NA 143 06/29/2019 0950   K 3.6 01/17/2024 0102   CL 103 01/17/2024 0102   CO2 24 01/17/2024 0102   GLUCOSE 119 (H) 01/17/2024 0102   BUN 22 01/17/2024 0102   BUN 19 06/29/2019 0950   CREATININE 0.76 01/17/2024 0102   CREATININE 0.71 12/29/2019 0818   CALCIUM  7.0 (L) 01/17/2024 0102   PROT 6.9 01/10/2024 1349    PROT 6.9 06/29/2019 0950   ALBUMIN 4.1 01/10/2024 1349   ALBUMIN 4.5 06/29/2019 0950   AST 59 (H) 01/10/2024 1349   ALT 65 (H) 01/10/2024 1349   ALKPHOS 136 (H) 01/10/2024 1349   BILITOT 0.4 01/10/2024 1349   BILITOT 0.5 06/29/2019 0950   GFRNONAA >60 01/17/2024 0102   GFRNONAA 89 12/29/2019 0818   GFRAA 104 12/29/2019 0818   Lipase     Component Value Date/Time   LIPASE 6 (L) 09/25/2014 1340       Studies/Results: DG Chest Port 1 View Result Date: 01/17/2024 EXAM: 1 VIEW(S) XRAY OF THE CHEST 01/17/2024 01:59:00 AM COMPARISON: 01/16/2024 CLINICAL HISTORY: Endotracheal tube present FINDINGS: LINES, TUBES AND DEVICES: Endotracheal tube in place with tip 4.3 cm above the carina. LUNGS AND PLEURA: Low lung volumes. Stable subsegmental atelectasis at left lung base and right mid lung . Small bilateral pleural effusions. No pneumothorax. HEART AND MEDIASTINUM: Persistent pneumomediastinum. Aortic atherosclerosis. No acute abnormality of the cardiac silhouette. BONES AND SOFT TISSUES: Persistent subcutaneous emphysema in lower neck. No acute osseous abnormality. IMPRESSION: 1. Endotracheal tube tip 4.3 cm above the carina. 2. Persistent pneumomediastinum and subcutaneous emphysema  in the lower neck. Electronically signed by: Norman Gatlin MD 01/17/2024 02:11 AM EST RP Workstation: [id number]   CT ANGIO HEAD NECK W WO CM Result Date: 01/16/2024 EXAM: CTA Head and Neck with Intravenous Contrast. CT Head without Contrast. CLINICAL HISTORY: Neck and facial swelling after thyroidectomy 2 days ago. Diffuse neck and face soft tissue swelling. TECHNIQUE: Axial CTA images of the head and neck performed with intravenous contrast. MIP reconstructed images were created and reviewed. Axial computed tomography images of the head/brain performed without intravenous contrast. Note: Per PQRS, the description of internal carotid artery percent stenosis, including 0 percent or normal exam, is based on [place]  American Symptomatic Carotid Endarterectomy Trial (NASCET) criteria. Dose reduction technique was used including one or more of the following: automated exposure control, adjustment of mA and kV according to patient size, and/or iterative reconstruction. CONTRAST: Without and with IV contrast. 75 mL iohexol  (OMNIPAQUE ) 350 MG/ML injection. COMPARISON: None provided. FINDINGS: CT HEAD: BRAIN: Generalized atrophy and white matter hypoattenuation is present. No acute intraparenchymal hemorrhage. No mass lesion. No CT evidence for acute territorial infarct. No midline shift or extra-axial collection. VENTRICLES: No hydrocephalus. ORBITS: Gas extends into the left inferior eyelid. SINUSES AND MASTOIDS: The paranasal sinuses and mastoid air cells are clear. CTA NECK: COMMON CAROTID ARTERIES: Atherosclerotic calcifications are present at the aortic arch and great vessel origins. No significant stenosis. No dissection or occlusion. INTERNAL CAROTID ARTERIES: Atherosclerotic changes are present at the right carotid bifurcation without focal stenosis. Tortuosity is present in the cervical left ICA without focal stenosis. Atherosclerotic calcifications are present within the cavernous internal carotid arteries bilaterally. Stenosis are present in the cavernous segments. No dissection or occlusion. VERTEBRAL ARTERIES: No significant stenosis. No dissection or occlusion. CTA HEAD: ANTERIOR CEREBRAL ARTERIES: No significant stenosis. No occlusion. No aneurysm. MIDDLE CEREBRAL ARTERIES: No significant stenosis. No occlusion. No aneurysm. POSTERIOR CEREBRAL ARTERIES: Fetal type posterior cerebral arteries are present bilaterally. No significant stenosis. No occlusion. No aneurysm. BASILAR ARTERY: No significant stenosis. No occlusion. No aneurysm. OTHER: SOFT TISSUES: Endotracheal tube is in place. The tip extends just into the right mainstem bronchus and could be pulled back 2 to 3 cm for more optimal positioning.  Pneumomediastinum is present. Extensive subcutaneous gas is present throughout the neck extending into the face, slightly more prominent on the right. Gas extends into the left inferior eyelid. No masses or lymphadenopathy. BONES: Multilevel degenerative changes are present within the cervical spine. IMPRESSION: 1. Extensive subcutaneous gas throughout the neck and face, slightly more prominent on the right, extending intracheal injury, potentially related to intubation. No discrete injury is evident. to the left inferior eyelid. 2. Pneumomediastinum. Findings suggest 3. Endotracheal tube tip extends just into the right mainstem bronchus and could be pulled back 2 to 3 cm for more optimal positioning. 4. Atherosclerotic changes as described. No focal stenosis. 5. No acute or focal vascular injury. 6. Findings were called to Dr. Stevie at 12:30 pm. Electronically signed by: Lonni Necessary MD 01/16/2024 12:32 PM EST RP Workstation: [id number]   DG CHEST PORT 1 VIEW Result Date: 01/16/2024 EXAM: 1 VIEW(S) XRAY OF THE CHEST 01/16/2024 12:18:58 PM COMPARISON: 01/16/2024 CLINICAL HISTORY: Pneumothorax FINDINGS: LINES, TUBES AND DEVICES: Endotracheal tube in place, tip terminates at the carina approaching the right mainstem bronchus. LUNGS AND PLEURA: Low lung volumes with atelectasis in both lung bases. Crowding of perihilar and bibasilar bronchovascular structures. No pneumothorax identified. Blunting of left lateral costophrenic angle. HEART AND MEDIASTINUM: Normal heart size. Aortic atherosclerosis (ICD10-170.0).  BONES AND SOFT TISSUES: Bilateral supraclavicular subcutaneous emphysema. No acute osseous abnormality. IMPRESSION: 1. No pneumothorax identified. 2. Bilateral supraclavicular subcutaneous emphysema and pneumomediastinum. Unchanged from prior exam. . 3. Low lung volumes with subsegmental atelectasis in lung bases and crowding of perihilar and bibasilar bronchovascular structures. Electronically  signed by: Waddell Calk MD 01/16/2024 12:26 PM EST RP Workstation: [id number]   CT CHEST WO CONTRAST Result Date: 01/16/2024 EXAM: CT CHEST WITHOUT CONTRAST 01/16/2024 11:50:38 AM TECHNIQUE: CT of the chest was performed without the administration of intravenous contrast. Multiplanar reformatted images are provided for review. Automated exposure control, iterative reconstruction, and/or weight based adjustment of the mA/kV was utilized to reduce the radiation dose to as low as reasonably achievable. COMPARISON: None available. CLINICAL HISTORY: Pneumothorax. FINDINGS: MEDIASTINUM: The endotracheal tube tip terminates at the level of the carina, approaching the right mainstem bronchus. Consider retraction of the endotracheal tube by 2 cm. Heart and pericardium are unremarkable. Aortic atherosclerotic calcification and multivessel coronary artery calcification. No mediastinal or pericardial fluid collection identified. Pneumomediastinum is present. The central airways are clear. LYMPH NODES: No mediastinal, hilar or axillary lymphadenopathy. LUNGS AND PLEURA: Bilateral posterior and medial lower lobe consolidation and atelectasis identified. Several lung nodules are identified, including considerations for a metastatic colon origin. An anterior left upper lobe nodule measures 5 mm (image 58/4). An anterior right lower lobe lung nodule measures 3 mm (image 61/4). A lateral left upper lobe nodule measures 3 mm (image 63/4). An inferior right middle lobe lung nodule measures 5 mm (image 88/4). No pleural effusion. No pneumothorax identified. SOFT TISSUES/BONES: Thoracic degenerative disc disease. No acute or suspicious osseous findings. There is fluid density within the thyroid  bed with surrounding surgical clips compatible with recent thyroidectomy. Extensive soft tissue gas is identified throughout portions of the neck, bilateral supraclavicular regions, and ventral chest wall. Soft tissue gas also tracks into the  left axilla. UPPER ABDOMEN: Limited images of the upper abdomen demonstrate calcification within the upper pole of the right kidney measuring 5 mm. No other acute abnormality. IMPRESSION: 1. No pneumothorax identified. 2. Endotracheal tube tip terminates at the level of the carina and is approaching the right mainstem bronchus; consider retraction by 2 cm. 3. Pneumomediastinum and extensive soft tissue gas throughout the neck, bilateral supraclavicular regions, ventral chest wall, and left axilla. 4. Bilateral posterior and medial lower lobe consolidation and atelectasis, with no pleural effusion. 5. Scattered 5 mm or smaller lung nodules identified. Nonspecific. No consensus criteria for follow-up available for patients with a known malignancy. Consider repeat imaging in 3 to 6 months to ensure the stability of these lung nodules in this patient who has a history of known thyroid  cancer. 6. The urgent finding will be called to the ordering provider by the Professional Radiology Assistants (PRAs) and documented in the [facility] dashboard. Electronically signed by: Waddell Calk MD 01/16/2024 12:15 PM EST RP Workstation: [id number]   DG CHEST PORT 1 VIEW Result Date: 01/16/2024 EXAM: 1 VIEW(S) XRAY OF THE CHEST 01/16/2024 05:24:00 AM COMPARISON: None available. CLINICAL HISTORY: Intubation of airway performed without difficulty. FINDINGS: LINES, TUBES AND DEVICES: Endotracheal tube in place with tip 4 cm above the carina. LUNGS AND PLEURA: Linear atelectasis at right lung base. Trace left apical pneumothorax. No pleural effusion. No focal pulmonary opacity. HEART AND MEDIASTINUM: Atherosclerotic plaque noted. Small volume pneumomediastinum. BONES AND SOFT TISSUES: Extensive subcutaneous emphysema along the neck and right upper chest. No acute osseous abnormality. IMPRESSION: 1. Endotracheal tube in place with tip 4 cm  above the carina. 2. Small volume pneumomediastinum and trace left apical pneumothorax. 3.  Extensive subcutaneous emphysema along the neck and right upper chest. 4. Critical results were called to the ordering provider at the time of interpretation. As personally spoke with doctor paliwal at the time of interpretation on 01/16/2024 at 5:50 am. Electronically signed by: Waddell Calk MD 01/16/2024 05:51 AM EST RP Workstation: [id number]    Anti-infectives: Anti-infectives (From admission, onward)    None        Assessment/Plan S/P total thyroidectomy with central compartment LN dissection by Dr. Eletha 12/19 Post-op neck and facial swelling  Acute ventilator dependent respiratory failure  -Afebrile. -WBC 12.5; HGB 11.3 -Chest xray 12/22 shows persistent pneumomediastinum and subcutaneous emphysema in lower neck -NGT with 300 mL output.  -Abdominal xray pending -On vent for protected airway -Dr. Eletha is aware of patient's admission and clinical course. Planning for ENT to be consulted.    FEN: NPO/NGT; Calcium  gluconate/sodium chloride  VTE: SCDs ID: None currently    LOS: 1 day   I reviewed critical care notes, specialist notes, nursing notes, last 24 h vitals and pain scores, last 48 h intake and output, last 24 h labs and trends, and last 24 h imaging results.   Marjorie Carlyon Favre, [facility] Surgery 01/17/2024, 9:14 AM Please see Amion for pager number during day hours 7:00am-4:30pm  "

## 2024-01-17 NOTE — Consult Note (Addendum)
 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  Upon d/c from [facility] she went home and [place].  That night of d/c she noticed worsening swelling in her neck and face and went to a local hospital.  She was transferred intubated to pre-empt any airways issues en route to [facility].  She apparently did not have any stridor. At [facility] she was given Dexamethasone  and Benadryl.  PCCM asked to help with ICU management.  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  Significant Hospital Events: Including procedures, antibiotic start and stop dates in addition to other pertinent events   Transferred to [facility] from [place]   Interim History / Subjective:  N/A  Objective    Blood pressure (!) 113/57, pulse (!) 59, temperature 99 F (37.2 C), temperature source Axillary, resp. rate 12, height 5' 5.98 (1.676 m), weight 110.8 kg, SpO2 95%.    Vent Mode: PRVC FiO2 (%):  [40 %] 40 % Set Rate:  [16 bmp] 16 bmp Vt Set:  [470 mL] 470 mL PEEP:  [5 cmH20] 5 cmH20 Pressure Support:  [8 cmH20] 8 cmH20 Plateau Pressure:  [16 cmH20-19 cmH20] 16 cmH20   Intake/Output Summary (Last 24 hours) at 01/17/2024 0801 Last data filed at 01/17/2024 0700 Gross per 24 hour  Intake 1949.31 ml  Output 1220 ml  Net 729.31 ml   Filed Weights   01/16/24 0345   Weight: 110.8 kg   Physical Exam: General: Well-appearing, no acute distress HENT: [place], AT, ETT in place Neck: Bilateral swelling, +crepitus Eyes: EOMI, no scleral icterus Respiratory: Clear to auscultation bilaterally.  No crackles, wheezing or rales Cardiovascular: RRR, -M/R/G, no JVD GI: BS+, soft, nontender Extremities:-Edema,-tenderness Neuro: Drowsy, eyes open, follows commands, CNII-XII grossly intact, moves extremities x 4 GU: External foley in place  Imaging, labs and test in EMR in the last 24 hours reviewed independently by me. Pertinent findings below:  WBC 12.5 Calcium  7.0 CTA 12/21 subcutaneous gas in neck and face, pneumomediastinum, ETT at main carina. No acute or focal vascular injury. Bilateral consolidation and atelectasis, scatted 5 mm lung nodules  Resolved problem list   Assessment and Plan   Acute hypoxemic respiratory failure Vent dependence for airway protection -Full vent support -LTVV, 4-8cc/kg IBW with goal Pplat<30 and DP<15 -VAP and PAD protocol for RASS goal -1  Recent total thyroidectomy with central compartment LN dissection 12/19 Complicated by post-op neck and facial swelling in setting of subcutaneous gas  Recent positive pressure from mechanical ventilation. Possible tracheobronchial injury however no further hemoptysis and clinically stable. Imaging with no fluid collection in mediastinum -Intubated for airway protection -Serial imaging -Surgery team following. Plan for ENT evaluation  Hypothyroidism -Restart home synthroid   HTN - wnl on sedation -Hold home anti-hypertensive agents  Subcentimeter pulmonary nodules Hx Papillary Thyroid  Cancer s/p total thyroidectomy Hx breast cancer -Will  need outpatient CT with PCP or pulmonary in 3-6 months  GERD -PPI  Critical care time: 35 min    The patient is critically ill with respiratory failure requiring mechanical ventilation and sedation and requires high complexity decision  making for assessment and support, frequent evaluation and titration of therapies, application of advanced monitoring technologies and extensive interpretation of multiple databases.  Independent Critical Care Time: 35 Minutes.   Slater Staff, M.D. [facility] Pulmonary/Critical Care Medicine 01/17/2024 8:01 AM   Please see Amion for pager number to reach on-call Pulmonary and Critical Care Team.         "

## 2024-01-18 ENCOUNTER — Ambulatory Visit: Payer: Self-pay | Admitting: Surgery

## 2024-01-18 LAB — SURGICAL PATHOLOGY

## 2024-01-18 NOTE — Progress Notes (Signed)
 Pathology report just received this afternoon.  Called to patient's husband and discussed.  Forwarded copy to Dr. Cheryl Inks at [facility].  Will require RAI ablation once recovered from surgical procedures.  Krystal Spinner, MD [facility] A [facility] practice Office: [phone]

## 2024-01-19 LAB — GLUCOSE, CAPILLARY: Glucose-Capillary: 130 mg/dL — ABNORMAL HIGH (ref 70–99)

## 2024-02-11 NOTE — Progress Notes (Signed)
 "  PROVIDER:  PUJA GOSAI MACZIS, PA  MRN: [id number] DOB: [birth year] DATE OF ENCOUNTER: 02/11/2024 Interval History:   Catherine Navarro is a 69 y.o. female who underwent total thyroidectomy with central compartment lymph node dissection for papillary thyroid  carcinoma on 01/14/2024 by Dr. Eletha.  Intraoperatively, there was a firm nodular mass in the medial aspect of the mid pole of the right lobe of the thyroid  which was fixed to the underlying trachea.  While removing this, a small 1 mm opening at the base of the resection was found worrisome for possible full-thickness tracheal defect.  This was closed with figure-of-eight Prolene suture.  Unfortunately, she returned to the hospital with neck swelling and was found to have a large volume subcutaneous emphysema due to tracheal injury. She was transferred to [facility] and underwent repair of tracheal defect, pedicled strap muscle flap, and JP drain placement on 01/18/2024 by Dr. Graig, ENT.    She followed up with Dr. Graig on 02/02/2024 at which time the drain was removed.  She is presenting today for postop visit.  She states that she has had a mild cough requiring clearing her throat but this has improved.  She believes her voice is slightly hoarse.  She denies any other issues with swallowing or breathing.  She is currently taking Synthroid  150 mcg, Calcitrol, and calcium .  She is not having any tingling/cramping sensations.  Denies fever.  Review of Systems:   ROS All other systems reviewed and are negative.  Medications:   Current Outpatient Medications on File Prior to Visit  Medication Sig Dispense Refill   calcitRIOL (ROCALTROL) 0.5 MCG capsule Take 0.5 mcg by mouth     calcium  citrate (CALCITRATE) 200 mg (950 mg) tablet Take 1,900 mg by mouth     levothyroxine  (SYNTHROID ) 150 MCG tablet Take 150 mcg by mouth     ascorbic acid, vitamin C, (VITAMIN C) 100 MG tablet Take 100 mg by mouth once daily     aspirin 81 MG chewable tablet Take  by mouth     cyanocobalamin  (VITAMIN B12) 1000 MCG tablet Take by mouth     docosahexaenoic acid-epa 120-180 mg Cap Take by mouth     fluticasone  propionate (FLONASE ) 50 mcg/actuation nasal spray Place 2 sprays into both nostrils once daily     garlic 400 mg Tab Take by mouth     loratadine (CLARITIN REDITABS) 10 mg dissolvable tablet Take 10 mg by mouth once daily     meloxicam  (MOBIC ) 15 MG tablet Take 15 mg by mouth once daily     multivitamin capsule Take 1 capsule by mouth once daily     pantoprazole  (PROTONIX ) 40 MG DR tablet pantoprazole  40 mg tablet,delayed release     potassium chloride  (KLOR-CON ) 20 MEQ ER tablet Take 1 tablet by mouth once daily     propranoloL  (INNOPRAN  XL) 80 MG XL capsule propranolol  ER 80 mg capsule,24 hr,extended release     pyridoxine, vitamin B6, (B-6) 100 MG tablet Take 100 mg by mouth once daily     valsartan -hydroCHLOROthiazide  (DIOVAN -HCT) 80-12.5 mg tablet valsartan  80 mg-hydrochlorothiazide  12.5 mg tablet     No current facility-administered medications on file prior to visit.    Physical Examination:   There were no vitals taken for this visit.  General: Well-developed, well-nourished, in no acute distress.   Anterior cervical incision: Clean dry and intact without erythema or drainage.  No swelling or tenderness to palpation  Assessment and Plan:   Diagnoses and all  orders for this visit:  Papillary thyroid  carcinoma (CMS/HHS-HCC) -     Thyroid  Stimulating Hormone (TSH); Future -     Calcium ; Future -     Parathyroid Hormone (PTH); Future    Catherine Navarro is status post total thyroidectomy with central compartment lymph node dissection on 01/14/2024, complicated by tracheal injury with repair on 01/18/2024.  On exam, she is healing well without signs of postop complication or infection.  She had many questions regarding her postop course and treatment plan so I called Dr. Eletha who spoke to the patient over the phone.  He  answered all questions to their satisfaction.  Patient will need radioactive iodine.  We will place a referral to endocrinology.  The patient prefers all providers to be in  .  We will recheck calcium , PTH, TSH.  She has 1 week worth of medications left so based on the results we will send in Synthroid , Calcitriol, and Calcium  refills.  She also needs to follow-up with Dr. Vernetta regarding breast cancer surveillance.  She would like to try to see Dr. Vernetta and Dr. Eletha on the same day if possible since she lives 1[place]5 hours away.  She will follow-up in 6 weeks for recheck and call with questions or concerns in the meantime.  Return in about 6 weeks (around 03/24/2024) for appt with Dr. Eletha.  Puja Maczis, [facility] Surgery A [facility] Practice "

## 2024-02-11 NOTE — Patient Instructions (Signed)
 Please get your lab work drawn next week to check calcium , parathyroid, and thyroid  function We will send in your medications based on your lab results  You can take Benadryl 25 mg at night to help with some of the coughing We will place a referral to a [facility]  endocrinology office - please call us  if you have not heard from them by the end of next week
